# Patient Record
Sex: Female | Born: 1950
Health system: Southern US, Community
[De-identification: ages and names within clinical notes are randomized; demographics above are authoritative.]

## PROBLEM LIST (undated history)

## (undated) DIAGNOSIS — I10 Essential (primary) hypertension: Secondary | ICD-10-CM

## (undated) DIAGNOSIS — F329 Major depressive disorder, single episode, unspecified: Secondary | ICD-10-CM

## (undated) DIAGNOSIS — E669 Obesity, unspecified: Secondary | ICD-10-CM

## (undated) DIAGNOSIS — T7840XA Allergy, unspecified, initial encounter: Secondary | ICD-10-CM

## (undated) DIAGNOSIS — M1990 Unspecified osteoarthritis, unspecified site: Secondary | ICD-10-CM

## (undated) DIAGNOSIS — M810 Age-related osteoporosis without current pathological fracture: Secondary | ICD-10-CM

## (undated) DIAGNOSIS — F419 Anxiety disorder, unspecified: Secondary | ICD-10-CM

## (undated) DIAGNOSIS — E785 Hyperlipidemia, unspecified: Secondary | ICD-10-CM

## (undated) DIAGNOSIS — Z8709 Personal history of other diseases of the respiratory system: Secondary | ICD-10-CM

## (undated) DIAGNOSIS — F32A Depression, unspecified: Secondary | ICD-10-CM

## (undated) DIAGNOSIS — N393 Stress incontinence (female) (male): Secondary | ICD-10-CM

## (undated) HISTORY — PX: TUBAL LIGATION: SHX77

## (undated) HISTORY — PX: CHOLECYSTECTOMY: SHX55

## (undated) HISTORY — PX: KNEE SURGERY: SHX244

## (undated) HISTORY — DX: Unspecified osteoarthritis, unspecified site: M19.90

## (undated) HISTORY — DX: Age-related osteoporosis without current pathological fracture: M81.0

## (undated) HISTORY — DX: Hyperlipidemia, unspecified: E78.5

## (undated) HISTORY — DX: Allergy, unspecified, initial encounter: T78.40XA

## (undated) HISTORY — PX: TONSILLECTOMY: SUR1361

## (undated) HISTORY — PX: OTHER SURGICAL HISTORY: SHX169

## (undated) HISTORY — DX: Anxiety disorder, unspecified: F41.9

## (undated) HISTORY — DX: Depression, unspecified: F32.A

## (undated) HISTORY — DX: Major depressive disorder, single episode, unspecified: F32.9

## (undated) HISTORY — PX: APPENDECTOMY: SHX54

## (undated) HISTORY — PX: LAPAROTOMY: SHX154

## (undated) HISTORY — DX: Essential (primary) hypertension: I10

## (undated) SURGERY — ARTHROPLASTY, KNEE, TOTAL
Anesthesia: Choice | Laterality: Right

---

## 2006-06-16 ENCOUNTER — Encounter (INDEPENDENT_AMBULATORY_CARE_PROVIDER_SITE_OTHER): Payer: Self-pay | Admitting: *Deleted

## 2006-06-16 ENCOUNTER — Ambulatory Visit (HOSPITAL_COMMUNITY): Admission: RE | Admit: 2006-06-16 | Discharge: 2006-06-16 | Payer: Self-pay | Admitting: Obstetrics and Gynecology

## 2007-12-08 ENCOUNTER — Ambulatory Visit: Payer: Self-pay | Admitting: Gastroenterology

## 2007-12-22 ENCOUNTER — Ambulatory Visit: Payer: Self-pay | Admitting: Gastroenterology

## 2007-12-22 HISTORY — PX: COLONOSCOPY: SHX174

## 2011-04-10 NOTE — Op Note (Signed)
Nicole Peters, Nicole Peters               ACCOUNT NO.:  [id number]   MEDICAL RECORD NO.:  [id number]          PATIENT TYPE:  AMB   LOCATION:  SDC                           FACILITY:  [facility]   PHYSICIAN:  Naima A. Dillard, M.D. DATE OF BIRTH:  10-30-1951   DATE OF PROCEDURE:  06/16/2006  DATE OF DISCHARGE:                                 OPERATIVE REPORT   PREOPERATIVE DIAGNOSIS:  Endocervical polyp with irregular vaginal bleeding.   POSTOPERATIVE DIAGNOSIS:  Endocervical polyp with irregular vaginal  bleeding, with endometrial polyp.   PROCEDURE:  D&C hysteroscopy polypectomy.   SURGEON:  Dr. Normand Sloop. There were no assistants.   ANESTHESIA:  General laryngeal mask airway.   SPECIMENS:  Endometrial curettings and polyps.   ESTIMATED BLOOD LOSS:  Minimal.   COMPLICATIONS:  None. The patient went to PACU in stable condition.   PROCEDURE IN DETAIL:  The patient was taken to the operating room. She was  given general anesthesia, placed in the dorsal lithotomy position and  prepped and draped in a normal sterile fashion. Her bladder was drained with  a straight catheter, and a bivalve speculum was placed into the vagina. The  anterior lip of the cervix was grasped with a single-tooth tenaculum. The  uterus sounded to 7 cm. The cervix was further dilated with Pratt dilators  to 21. The hysteroscope was placed into the uterine cavity, and there was a  small endocervical polyp and a small endometrial polyp seen. Polyp forceps  were placed through the cervical cavity, endometrial cavity, and polyps were  removed. A sharp curettage was noted, and 12-degree suction was noted, and  endometrial curettings were obtained. All were sent to pathology for  diagnosis. All instruments were removed from the vagina and cervix. The  tenaculum site was noted to be hemostatic with removal of tenaculum. Sponge,  lap and needle counts were correct. The patient went to the recovery room in  stable  condition.      Naima A. Normand Sloop, M.D.  Electronically Signed     NAD/MEDQ  D:  06/16/2006  T:  06/16/2006  Job:  [id number]

## 2011-04-10 NOTE — H&P (Signed)
NAMEKIRSTY, MONJARAZ                ACCOUNT NO.:  [id number]   MEDICAL RECORD NO.:  [id number]          PATIENT TYPE:  AMB   LOCATION:                                FACILITY:  [facility]   PHYSICIAN:  Naima A. Dillard, M.D. DATE OF BIRTH:  1951-01-13   DATE OF ADMISSION:  06/16/2006  DATE OF DISCHARGE:                                HISTORY & PHYSICAL   DIAGNOSIS:  Endocervical polyp and irregular vaginal bleeding.   HISTORY AND PHYSICAL:  The patient presents in May of 2007 stating that she  had not had menses for 11 months, and then in May she had 1 menses for 5  days.  Six months before that, she had another menses that lasted for 5  days, and 6 months before that she had a menses that lasted 9 days.  She  changed the pad every 2-3 hours.  No chest pain, no shortness of breath and  no history of bleeding disorders.  The patient was taking Estroven, which  she started 2 years ago.  Denied any new medications.  No menopausal  symptoms, vaginal discharge, abdominal pain or increased stress.  The  patient came to the office and was found to have an endocervical polyp that  was benign.  Her endometrial biopsy was also benign.  On ultrasound, the  patient was noted to have a normal-sized uterus, 7 x 5 x 3, with normal  ovaries; however, she had an endocervical hyperechoic mass measuring 0.86 x  0.5 cm in size.  We tried to do a sonohystogram, but the endocervical polyp  blocks it.   MEDICATIONS:  1. Xanax.  2. Wellbutrin.  3. Rhinocort.  4. Fish oil.  5. Aspirin.   PAST SURGICAL HISTORY:  1. Bilateral tubal ligation.  2. Tonsillectomy.  3. Exploratory laparotomy for a small bowel obstruction in 1997.  4. Appendectomy.  5. Cholecystectomy.   PAST MEDICAL HISTORY:  As above.   SOCIAL HISTORY:  She smokes a pack a day.  Denies any alcohol or illicit  drug use.   REVIEW OF SYSTEMS:  CARDIOVASCULAR, GI, ENDOCRINE, MUSCULOSKELETAL:  Unremarkable.  PSYCHIATRIC:  Significant for  anxiety.  GENITOURINARY:  As  above.   PHYSICAL EXAMINATION:  VITAL SIGNS:  The patient's blood pressure is 136/80,  weight is 192.  HEENT:  Pupils are equal.  Hearing is normal.  Throat is clear.  Thyroid is  not enlarged.  HEART:  Regular rate and rhythm.  LUNGS:  Clear to auscultation bilaterally.  BACK:  No CVA tenderness bilaterally.  ABDOMEN:  Nontender without any mass or organomegaly.  EXTREMITIES:  No clubbing, cyanosis, or edema.  NEUROLOGIC:  Within normal limits.  PELVIC:  Full vaginal exam is within normal limits.  Cervix is nontender  with an endocervical polyp and a cervical mass.  The cervical mass was  biopsied and noted to be a nabothian cyst.  Uterus was normal shape, size  and consistency.  Adnexa revealed no masses and nontender.   ASSESSMENT:  1. Irregular vaginal bleeding.  2. Endocervical polyps.   The patient desires D&C hysteroscopy  with polypectomy.  She does not desire  any further treatment, especially if everything is benign.      Naima A. Normand Sloop, M.D.  Electronically Signed     NAD/MEDQ  D:  06/15/2006  T:  06/16/2006  Job:  [id number]

## 2011-05-19 ENCOUNTER — Encounter (HOSPITAL_BASED_OUTPATIENT_CLINIC_OR_DEPARTMENT_OTHER)
Admission: RE | Admit: 2011-05-19 | Discharge: 2011-05-19 | Disposition: A | Discharge status: Home / Self Care | Payer: BC Managed Care – PPO | Source: Ambulatory Visit | Attending: Orthopedic Surgery | Admitting: Orthopedic Surgery

## 2011-05-19 LAB — BASIC METABOLIC PANEL
BUN: 11 mg/dL (ref 6–23)
Creatinine, Ser: 0.65 mg/dL (ref 0.50–1.10)
GFR calc Af Amer: >60 mL/min (ref >60)
GFR calc non Af Amer: >60 mL/min (ref >60)
Glucose, Bld: 104 mg/dL — ABNORMAL HIGH (ref 70–99)
Potassium: 4.4 mEq/L (ref 3.5–5.1)

## 2011-05-20 ENCOUNTER — Ambulatory Visit (HOSPITAL_BASED_OUTPATIENT_CLINIC_OR_DEPARTMENT_OTHER)
Admission: RE | Admit: 2011-05-20 | Discharge: 2011-05-20 | Disposition: A | Discharge status: Home / Self Care | Payer: BC Managed Care – PPO | Source: Ambulatory Visit | Attending: Orthopedic Surgery | Admitting: Orthopedic Surgery

## 2011-05-20 DIAGNOSIS — M2340 Loose body in knee, unspecified knee: Secondary | ICD-10-CM | POA: Insufficient documentation

## 2011-05-20 DIAGNOSIS — Z01810 Encounter for preprocedural cardiovascular examination: Secondary | ICD-10-CM | POA: Insufficient documentation

## 2011-05-20 DIAGNOSIS — M2240 Chondromalacia patellae, unspecified knee: Secondary | ICD-10-CM | POA: Insufficient documentation

## 2011-05-20 DIAGNOSIS — M23359 Other meniscus derangements, posterior horn of lateral meniscus, unspecified knee: Secondary | ICD-10-CM | POA: Insufficient documentation

## 2011-05-20 DIAGNOSIS — I10 Essential (primary) hypertension: Secondary | ICD-10-CM | POA: Insufficient documentation

## 2011-05-20 DIAGNOSIS — Z01812 Encounter for preprocedural laboratory examination: Secondary | ICD-10-CM | POA: Insufficient documentation

## 2012-06-10 ENCOUNTER — Ambulatory Visit (INDEPENDENT_AMBULATORY_CARE_PROVIDER_SITE_OTHER): Payer: BC Managed Care – PPO | Admitting: Internal Medicine

## 2012-06-10 VITALS — BP 140/80 | HR 83 | Temp 98.0°F | Resp 16 | Ht 63.0 in | Wt 190.0 lb

## 2012-06-10 DIAGNOSIS — E789 Disorder of lipoprotein metabolism, unspecified: Secondary | ICD-10-CM

## 2012-06-10 DIAGNOSIS — F17200 Nicotine dependence, unspecified, uncomplicated: Secondary | ICD-10-CM

## 2012-06-10 DIAGNOSIS — Z720 Tobacco use: Secondary | ICD-10-CM

## 2012-06-10 DIAGNOSIS — I10 Essential (primary) hypertension: Secondary | ICD-10-CM

## 2012-06-10 DIAGNOSIS — J209 Acute bronchitis, unspecified: Secondary | ICD-10-CM

## 2012-06-10 DIAGNOSIS — J40 Bronchitis, not specified as acute or chronic: Secondary | ICD-10-CM

## 2012-06-10 DIAGNOSIS — R05 Cough: Secondary | ICD-10-CM

## 2012-06-10 MED ORDER — AZITHROMYCIN 500 MG PO TABS: 500.0000 mg | ORAL_TABLET | Freq: Every day | ORAL | Status: AC

## 2012-06-10 MED ORDER — HYDROCODONE-ACETAMINOPHEN 7.5-500 MG/15ML PO SOLN: 5.0000 mL | Freq: Four times a day (QID) | ORAL | Status: AC | PRN

## 2012-06-10 NOTE — Progress Notes (Signed)
  Subjective:    Patient ID: Nicole Peters, female    DOB: [birth year], 61 y.o.   MRN: [id number]  HPI C/o cough, yellow sputum, chest congestion. Has resumed smoking. No sob,cp   Review of Systems     Objective:   Physical Exam  Constitutional: She is oriented to person, place, and time. She appears well-nourished. No distress.  HENT:  Right Ear: External ear normal.  Left Ear: External ear normal.  Mouth/Throat: Oropharynx is clear and moist.  Cardiovascular: Normal rate, regular rhythm and normal heart sounds.   Pulmonary/Chest: No respiratory distress. She has rhonchi.  Neurological: She is alert and oriented to person, place, and time.  Psychiatric: She has a normal mood and affect.          Assessment & Plan:  Zithromax Lortab elixir Quit smoking

## 2012-06-10 NOTE — Patient Instructions (Addendum)

## 2012-08-09 ENCOUNTER — Telehealth: Payer: Self-pay

## 2012-08-09 NOTE — Telephone Encounter (Signed)
PT JUST WANTED DR MCPHERSON TO KNOW THAT SHE HAVE AN APPT WITH HER IN November AND SHE WENT YESTERDAY TO HAVE HER MAMMOGRAM AND IT WAS NEGATIVE SO SHE WILL BE GETTING THE REPORT.  SHE JUST WANTED HER TO KNOW. YOU MAY REACH PT AT [phone]

## 2012-08-25 ENCOUNTER — Encounter: Payer: Self-pay | Admitting: Family Medicine

## 2012-09-26 ENCOUNTER — Other Ambulatory Visit: Payer: Self-pay | Admitting: Family Medicine

## 2012-09-27 ENCOUNTER — Telehealth: Payer: Self-pay | Admitting: Radiology

## 2012-09-27 MED ORDER — BUPROPION HCL ER (XL) 300 MG PO TB24: 300.0000 mg | ORAL_TABLET | Freq: Every day | ORAL | Status: DC

## 2012-09-27 NOTE — Telephone Encounter (Signed)
Please pull chart.

## 2012-09-27 NOTE — Telephone Encounter (Signed)
Chart pulled to PA pool at nurses station [phone]

## 2012-09-27 NOTE — Telephone Encounter (Signed)
Rx sent to pharmacy   

## 2012-09-27 NOTE — Telephone Encounter (Signed)
[facility] is cal[facility]ill on this patient's refill of Wellbutrin 300 1 po qd. They said that have sent [place] several requests and they haven't heard anything.

## 2012-10-06 ENCOUNTER — Other Ambulatory Visit: Payer: Self-pay | Admitting: Radiology

## 2012-10-06 MED ORDER — ALPRAZOLAM 0.25 MG PO TABS: 0.2500 mg | ORAL_TABLET | Freq: Every evening | ORAL | Status: DC | PRN

## 2012-10-06 NOTE — Telephone Encounter (Signed)
Patients pharmacy [facility] requests Rx for Alprazolam, please advise. Rx pended.

## 2012-10-10 ENCOUNTER — Encounter: Payer: Self-pay | Admitting: Family Medicine

## 2012-10-14 ENCOUNTER — Encounter: Payer: Self-pay | Admitting: Family Medicine

## 2012-10-14 ENCOUNTER — Ambulatory Visit (INDEPENDENT_AMBULATORY_CARE_PROVIDER_SITE_OTHER): Payer: BC Managed Care – PPO | Admitting: Family Medicine

## 2012-10-14 VITALS — BP 120/92 | HR 78 | Temp 97.1°F | Resp 16 | Ht 63.0 in | Wt 186.6 lb

## 2012-10-14 DIAGNOSIS — E669 Obesity, unspecified: Secondary | ICD-10-CM

## 2012-10-14 DIAGNOSIS — I10 Essential (primary) hypertension: Secondary | ICD-10-CM

## 2012-10-14 DIAGNOSIS — E7800 Pure hypercholesterolemia, unspecified: Secondary | ICD-10-CM

## 2012-10-14 DIAGNOSIS — Z0000 Encounter for general adult medical examination without abnormal findings: Secondary | ICD-10-CM

## 2012-10-14 LAB — POCT URINALYSIS DIPSTICK
Bilirubin, UA: NEGATIVE
Glucose, UA: NEGATIVE
Leukocytes, UA: NEGATIVE
Nitrite, UA: NEGATIVE
pH, UA: 7

## 2012-10-14 LAB — COMPREHENSIVE METABOLIC PANEL
ALT: 20 U/L (ref 0–35)
AST: 20 U/L (ref 0–37)
Albumin: 4.7 g/dL (ref 3.5–5.2)
CO2: 25 mEq/L (ref 19–32)
Calcium: 10 mg/dL (ref 8.4–10.5)
Chloride: 102 mEq/L (ref 96–112)
Potassium: 4.5 mEq/L (ref 3.5–5.3)
Sodium: 138 mEq/L (ref 135–145)
Total Protein: 7 g/dL (ref 6.0–8.3)

## 2012-10-14 LAB — LIPID PANEL: Total CHOL/HDL Ratio: 2.4 Ratio

## 2012-10-14 LAB — IFOBT (OCCULT BLOOD): IFOBT: NEGATIVE

## 2012-10-14 MED ORDER — ALPRAZOLAM 0.25 MG PO TABS: ORAL_TABLET | ORAL | Status: DC

## 2012-10-14 MED ORDER — BUPROPION HCL ER (XL) 300 MG PO TB24: 300.0000 mg | ORAL_TABLET | Freq: Every day | ORAL | Status: DC

## 2012-10-14 MED ORDER — BENAZEPRIL HCL 5 MG PO TABS: 5.0000 mg | ORAL_TABLET | Freq: Every day | ORAL | Status: DC

## 2012-10-14 MED ORDER — PRAVASTATIN SODIUM 40 MG PO TABS: 40.0000 mg | ORAL_TABLET | Freq: Every day | ORAL | Status: DC

## 2012-10-14 NOTE — Patient Instructions (Signed)

## 2012-10-14 NOTE — Progress Notes (Signed)
Subjective:    Patient ID: Nicole Peters, female    DOB: 02/25/1951, 60 y.o.   MRN: [id number]  HPI   This 61 y.o. Cauc female is here for CPE. Chronic medical problems include HTN, Anxiety  with Depression, Dyslipidemia, DJD and COPD. Pt had quit smoking Sept 2012 but was seen  at 89 recently for bronchitis and note stated that she had resumed smoking (1/4 ppd).  Pt is married, exercises 4x/week and does not consume alcohol.   Last PAP: 11/12/ 2012 (normal).  Last MMG: 08/08/12 (normal)  Last ECG: 06/18/11 (normal)  Last DEXA: 10/22/10 (normal)  Last CRS: Jan 2009 (normal)    Review of Systems  Constitutional: Negative.   HENT: Negative.   Eyes: Negative.   Respiratory: Negative.   Cardiovascular: Negative.   Gastrointestinal: Negative.   Genitourinary: Negative.   Musculoskeletal: Positive for arthralgias.  Skin: Negative.   Neurological: Negative.   Hematological: Negative.   Psychiatric/Behavioral: The patient is nervous/anxious.        Objective:   Physical Exam  Nursing note and vitals reviewed. Constitutional: She is oriented to person, place, and time. She appears well-developed and well-nourished. No distress.  HENT:  Head: Normocephalic and atraumatic.  Right Ear: Hearing, tympanic membrane, external ear and ear canal normal.  Left Ear: Hearing, tympanic membrane, external ear and ear canal normal.  Nose: Nose normal. No nasal deformity or septal deviation.  Mouth/Throat: Uvula is midline, oropharynx is clear and moist and mucous membranes are normal. No oral lesions. No dental caries.  Eyes: Conjunctivae normal, EOM and lids are normal. Pupils are equal, round, and reactive to light. No scleral icterus.  Fundoscopic exam:      The right eye shows no arteriolar narrowing and no hemorrhage. The right eye shows red reflex.      The left eye shows no arteriolar narrowing and no hemorrhage. The left eye shows red reflex. Neck: Normal range of motion. Neck supple.  No JVD present. No thyromegaly present.  Cardiovascular: Normal rate, regular rhythm, normal heart sounds and intact distal pulses.  Exam reveals no gallop and no friction rub.   No murmur heard. Pulmonary/Chest: Effort normal and breath sounds normal. No respiratory distress. She has no wheezes. Right breast exhibits no inverted nipple, no mass, no nipple discharge, no skin change and no tenderness. Left breast exhibits no inverted nipple, no mass, no nipple discharge, no skin change and no tenderness. Breasts are symmetrical.  Abdominal: Soft. Bowel sounds are normal. She exhibits no distension, no abdominal bruit, no pulsatile midline mass and no mass. There is no hepatosplenomegaly. There is no tenderness. There is no guarding and no CVA tenderness.  Genitourinary: Rectum normal. Rectal exam shows no external hemorrhoid, no fissure, no mass, no tenderness and anal tone normal. Guaiac negative stool.       Pelvic not performed.  Musculoskeletal: Normal range of motion. She exhibits no edema and no tenderness.       Well healed post surgical scars; good ROM w/o effusion.  Lymphadenopathy:    She has no cervical adenopathy.  Neurological: She is alert and oriented to person, place, and time. She has normal reflexes. No cranial nerve deficit. She exhibits normal muscle tone. Coordination normal.  Skin: Skin is warm and dry. No rash noted. No erythema. No pallor.  Psychiatric: She has a normal mood and affect. Her behavior is normal. Judgment and thought content normal.          Assessment & Plan:  1. Routine general medical examination at a health care facility  IFOBT POC (occult bld, rslt in office), POCT urinalysis dipstick  2. Hypercholesteremia  Comprehensive metabolic panel, Lipid panel  3. HTN (hypertension)  Comprehensive metabolic panel  4. Obesity (BMI 30.0-34.9)  Encouraged better nutrition and continued active lifestyle; aim for weight loss of 1/2- 1 pound per week.   RFs on  all current chronic medications completed.

## 2012-10-16 NOTE — Progress Notes (Signed)
Quick Note:  Please notify pt that results are normal.   Provide pt with copy of labs. ______ 

## 2012-10-17 ENCOUNTER — Encounter: Payer: Self-pay | Admitting: *Deleted

## 2012-10-18 ENCOUNTER — Encounter: Payer: Self-pay | Admitting: Family Medicine

## 2012-10-18 DIAGNOSIS — M1990 Unspecified osteoarthritis, unspecified site: Secondary | ICD-10-CM | POA: Insufficient documentation

## 2012-10-18 DIAGNOSIS — Z720 Tobacco use: Secondary | ICD-10-CM | POA: Insufficient documentation

## 2012-10-18 DIAGNOSIS — F418 Other specified anxiety disorders: Secondary | ICD-10-CM | POA: Insufficient documentation

## 2013-04-27 ENCOUNTER — Encounter: Payer: Self-pay | Admitting: Family Medicine

## 2013-04-27 ENCOUNTER — Ambulatory Visit (INDEPENDENT_AMBULATORY_CARE_PROVIDER_SITE_OTHER): Payer: BC Managed Care – PPO | Admitting: Family Medicine

## 2013-04-27 VITALS — BP 160/80 | HR 81 | Temp 99.3°F | Resp 16 | Ht 63.0 in | Wt 189.4 lb

## 2013-04-27 DIAGNOSIS — I10 Essential (primary) hypertension: Secondary | ICD-10-CM

## 2013-04-27 DIAGNOSIS — E669 Obesity, unspecified: Secondary | ICD-10-CM

## 2013-04-27 MED ORDER — PRAVASTATIN SODIUM 40 MG PO TABS: ORAL_TABLET | ORAL | Status: DC

## 2013-04-27 MED ORDER — ALPRAZOLAM 0.25 MG PO TABS: ORAL_TABLET | ORAL | Status: DC

## 2013-04-27 NOTE — Progress Notes (Signed)
S:  This 62 y.o. Cauc female has HTN and is compliant w/ medications w/o side effects. BP readings at home: 115-125/55-80, heart rate in mid- 70s. Pt desires weight loss but is limited by chronic DJD involving knees; she sees Dr. Luiz Blare and is having minimal pain today after receiving joint injections from [place]. She has not considered aqua aerobics. Pt denies diaphoresis, fatigue, CP or tightness, palpitations, edema, cough, SOB or DOE, HA, numbness, dizziness, weakness or syncope.  PMHx, Soc Hx and Fam Hx reviewed.  ROS: As per HPI.  O:  Filed Vitals:   04/27/13 0926  BP: 160/80  Pulse: 81  Temp: 99.3 F (37.4 C)  Resp: 16   GEN: In NAD; WN,WD. Pt is obese. HENT: [place]/AT; EOMI w/ clear conj/ sclerae. Wears glasses.  EACs/ nose/ oroph unremarkable. COR: RRR. No m/g/r. No edema. LUNGS: CTA. MS: Knees- mild deg changes noted; no effusion. (copies of xrays of bilateral knees show moderately severe deg disease w/ bone-on-bone loss of joint space). NEURO: A&O x 3; CNs intact. Nonfocal.  A/P: HTN, goal below 140/80- controlled per pt's home readings; no change in medications  Obesity, unspecified- encouraged some form of low impact exercise to promote weight loss.  Meds ordered this encounter  Medications  . ALPRAZolam (XANAX) 0.25 MG tablet    Sig: Take 1- 1 1/2 tablets by mouth daily.    Dispense:  135 tablet    Refill:  0    Do not fill before Sept 1, 2014.  . pravastatin (PRAVACHOL) 40 MG tablet    Sig: Take 2 tablets every evening at bedtime.    Dispense:  180 tablet    Refill:  1

## 2013-04-27 NOTE — Patient Instructions (Signed)
Your next visit will be in November for annual physical (maybe); labs will be done. Continue all current medications. Try Flax Seed Oil capsules 1000 mg per capsule once a day taken separately from other solid capsules and pills.

## 2013-08-17 ENCOUNTER — Encounter: Payer: Self-pay | Admitting: *Deleted

## 2013-10-17 ENCOUNTER — Encounter: Payer: Self-pay | Admitting: Family Medicine

## 2013-10-17 ENCOUNTER — Ambulatory Visit (INDEPENDENT_AMBULATORY_CARE_PROVIDER_SITE_OTHER): Payer: BC Managed Care – PPO | Admitting: Family Medicine

## 2013-10-17 VITALS — BP 153/85 | HR 83 | Temp 97.7°F | Resp 16 | Ht 62.5 in | Wt 189.0 lb

## 2013-10-17 DIAGNOSIS — E789 Disorder of lipoprotein metabolism, unspecified: Secondary | ICD-10-CM

## 2013-10-17 DIAGNOSIS — F418 Other specified anxiety disorders: Secondary | ICD-10-CM

## 2013-10-17 DIAGNOSIS — F341 Dysthymic disorder: Secondary | ICD-10-CM

## 2013-10-17 DIAGNOSIS — I10 Essential (primary) hypertension: Secondary | ICD-10-CM

## 2013-10-17 LAB — CBC WITH DIFFERENTIAL/PLATELET
Basophils Absolute: 0 10*3/uL (ref 0.0–0.1)
Basophils Relative: 0 % (ref 0–1)
Eosinophils Absolute: 0.2 10*3/uL (ref 0.0–0.7)
Eosinophils Relative: 2 % (ref 0–5)
Hemoglobin: 14.7 g/dL (ref 12.0–15.0)
Lymphs Abs: 2.6 10*3/uL (ref 0.7–4.0)
MCH: 32.2 pg (ref 26.0–34.0)
MCHC: 34.8 g/dL (ref 30.0–36.0)
MCV: 92.5 fL (ref 78.0–100.0)
Monocytes Relative: 9 % (ref 3–12)
Neutro Abs: 5.6 10*3/uL (ref 1.7–7.7)
Neutrophils Relative %: 61 % (ref 43–77)
Platelets: 273 10*3/uL (ref 150–400)
RDW: 13.3 % (ref 11.5–15.5)
WBC: 9.2 10*3/uL (ref 4.0–10.5)

## 2013-10-17 MED ORDER — ALPRAZOLAM 0.25 MG PO TABS: ORAL_TABLET | ORAL | Status: DC

## 2013-10-18 ENCOUNTER — Encounter: Payer: Self-pay | Admitting: Family Medicine

## 2013-10-18 LAB — COMPREHENSIVE METABOLIC PANEL
Albumin: 4.2 g/dL (ref 3.5–5.2)
CO2: 24 mEq/L (ref 19–32)
Glucose, Bld: 95 mg/dL (ref 70–99)
Sodium: 137 mEq/L (ref 135–145)
Total Bilirubin: 0.6 mg/dL (ref 0.3–1.2)
Total Protein: 6.5 g/dL (ref 6.0–8.3)

## 2013-10-18 LAB — LIPID PANEL
Cholesterol: 175 mg/dL (ref 0–200)
HDL: 70 mg/dL (ref >39)

## 2013-10-18 LAB — HEPATITIS C ANTIBODY: HCV Ab: NEGATIVE

## 2013-10-18 NOTE — Progress Notes (Signed)
S:  This pt returns for HTN follow-up and labs monitoring lipid disorder. She is compliant w/ medications w/o adverse effects.  BP readings at home: 115-130/ 65-70. Pt denies fatigue, diaphoresis, vision disturbances, CP or tightness, palpitations, edema, SOB or DOE, cough, HA, numbness, weakness, tremor or syncope.  Pt continues to use Alprazolam as needed to cope w/ family health issues. She has chronic depressin which is controlled and in remission w/ Bupropion XL. Pt has no thoughts of self harm, has normal energy appetite and sleep pattern.   Patient Active Problem List   Diagnosis Date Noted  . DJD (degenerative joint disease) 10/18/2012  . Depression with anxiety 10/18/2012  . Tobacco user 10/18/2012  . Obesity (BMI 30.0-34.9) 10/14/2012  . HTN (hypertension) 06/10/2012  . Lipid disorder 06/10/2012   PMHx, Soc and Fam Hx reviewed.  Medications reconciled.  ROS: As per HPI.  O: Filed Vitals:   10/17/13 1458  BP: 153/85  Pulse: 83  Temp: 97.7 F (36.5 C)  Resp: 16   GEN: In NAD: WN,WD. HENT: [place]/AT; EOMI w/ clear conj/sclerae. Otherwise unremarkable. COR: RRR. LUNGS: Unlabored resp. SKIN: W&D; intact w/o erythema, diaphoresis or pallor. NEURO: A&O x 3; CNs intact. Nonfocal.  A/P: HTN (hypertension) - Stable and controlled on current medication; no change. Plan: Comprehensive metabolic panel, Hepatitis C antibody, CBC with Differential  Lipid disorder - Continue Pravastatin. Continue Flax seed Oil 1000 mg daily. Plan: Lipid panel  Depression with anxiety - Continue Bupropion daily and Alprazolam prn.   Meds ordered this encounter  Medications  . ALPRAZolam (XANAX) 0.25 MG tablet    Sig: Take 1- 1 1/2 tablets by mouth daily.    Dispense:  135 tablet    Refill:  1    135 tablets must last 3 months.

## 2013-10-20 NOTE — Progress Notes (Signed)
Quick Note:  Please notify pt that results are normal.   Provide pt with copy of labs. ______ 

## 2013-12-07 ENCOUNTER — Other Ambulatory Visit: Payer: Self-pay | Admitting: Family Medicine

## 2013-12-29 ENCOUNTER — Other Ambulatory Visit: Payer: Self-pay | Admitting: Family Medicine

## 2014-01-08 ENCOUNTER — Encounter: Payer: BC Managed Care – PPO | Admitting: Family Medicine

## 2014-02-05 ENCOUNTER — Encounter: Payer: Self-pay | Admitting: Family Medicine

## 2014-02-05 ENCOUNTER — Ambulatory Visit (INDEPENDENT_AMBULATORY_CARE_PROVIDER_SITE_OTHER): Payer: BC Managed Care – PPO | Admitting: Family Medicine

## 2014-02-05 VITALS — BP 140/86 | HR 89 | Temp 98.9°F | Resp 16 | Ht 62.75 in | Wt 184.0 lb

## 2014-02-05 DIAGNOSIS — I10 Essential (primary) hypertension: Secondary | ICD-10-CM

## 2014-02-05 DIAGNOSIS — F418 Other specified anxiety disorders: Secondary | ICD-10-CM

## 2014-02-05 DIAGNOSIS — E7800 Pure hypercholesterolemia, unspecified: Secondary | ICD-10-CM

## 2014-02-05 DIAGNOSIS — Z1211 Encounter for screening for malignant neoplasm of colon: Secondary | ICD-10-CM

## 2014-02-05 DIAGNOSIS — E789 Disorder of lipoprotein metabolism, unspecified: Secondary | ICD-10-CM

## 2014-02-05 DIAGNOSIS — F329 Major depressive disorder, single episode, unspecified: Secondary | ICD-10-CM

## 2014-02-05 DIAGNOSIS — F17200 Nicotine dependence, unspecified, uncomplicated: Secondary | ICD-10-CM

## 2014-02-05 DIAGNOSIS — Z0000 Encounter for general adult medical examination without abnormal findings: Secondary | ICD-10-CM

## 2014-02-05 DIAGNOSIS — Z720 Tobacco use: Secondary | ICD-10-CM

## 2014-02-05 DIAGNOSIS — Z01419 Encounter for gynecological examination (general) (routine) without abnormal findings: Secondary | ICD-10-CM

## 2014-02-05 DIAGNOSIS — F3289 Other specified depressive episodes: Secondary | ICD-10-CM

## 2014-02-05 LAB — IFOBT (OCCULT BLOOD): IFOBT: NEGATIVE

## 2014-02-05 LAB — POCT URINALYSIS DIPSTICK
Bilirubin, UA: NEGATIVE
GLUCOSE UA: NEGATIVE
Ketones, UA: NEGATIVE
NITRITE UA: NEGATIVE
Protein, UA: NEGATIVE
Spec Grav, UA: 1.015
UROBILINOGEN UA: 0.2
pH, UA: 7

## 2014-02-05 LAB — LIPID PANEL
Cholesterol: 203 mg/dL — ABNORMAL HIGH (ref 0–200)
HDL: 78 mg/dL (ref >39)
LDL Cholesterol: 103 mg/dL — ABNORMAL HIGH (ref 0–99)
TRIGLYCERIDES: 108 mg/dL (ref <150)
Total CHOL/HDL Ratio: 2.6 Ratio
VLDL: 22 mg/dL (ref 0–40)

## 2014-02-05 LAB — CBC WITH DIFFERENTIAL/PLATELET
BASOS ABS: 0 10*3/uL (ref 0.0–0.1)
BASOS PCT: 0 % (ref 0–1)
EOS PCT: 1 % (ref 0–5)
Eosinophils Absolute: 0.1 10*3/uL (ref 0.0–0.7)
HCT: 44.5 % (ref 36.0–46.0)
Hemoglobin: 15.6 g/dL — ABNORMAL HIGH (ref 12.0–15.0)
LYMPHS PCT: 27 % (ref 12–46)
Lymphs Abs: 2.4 10*3/uL (ref 0.7–4.0)
MCH: 31.6 pg (ref 26.0–34.0)
MCHC: 35.1 g/dL (ref 30.0–36.0)
MCV: 90.1 fL (ref 78.0–100.0)
Monocytes Absolute: 0.6 10*3/uL (ref 0.1–1.0)
Monocytes Relative: 7 % (ref 3–12)
Neutro Abs: 5.7 10*3/uL (ref 1.7–7.7)
Neutrophils Relative %: 65 % (ref 43–77)
Platelets: 254 10*3/uL (ref 150–400)
RBC: 4.94 MIL/uL (ref 3.87–5.11)
RDW: 13.2 % (ref 11.5–15.5)
WBC: 8.8 10*3/uL (ref 4.0–10.5)

## 2014-02-05 LAB — HEMOGLOBIN A1C
HEMOGLOBIN A1C: 5.7 % — AB (ref <5.7)
Mean Plasma Glucose: 117 mg/dL — ABNORMAL HIGH (ref <117)

## 2014-02-05 LAB — COMPLETE METABOLIC PANEL WITH GFR
ALT: 20 U/L (ref 0–35)
AST: 16 U/L (ref 0–37)
Albumin: 4.4 g/dL (ref 3.5–5.2)
Alkaline Phosphatase: 68 U/L (ref 39–117)
BUN: 13 mg/dL (ref 6–23)
CALCIUM: 9.8 mg/dL (ref 8.4–10.5)
CHLORIDE: 100 meq/L (ref 96–112)
CO2: 28 mEq/L (ref 19–32)
Creat: 0.8 mg/dL (ref 0.50–1.10)
GFR, EST NON AFRICAN AMERICAN: 79 mL/min
GFR, Est African American: >89 mL/min
GLUCOSE: 97 mg/dL (ref 70–99)
POTASSIUM: 4.5 meq/L (ref 3.5–5.3)
Sodium: 137 mEq/L (ref 135–145)
Total Bilirubin: 0.6 mg/dL (ref 0.2–1.2)
Total Protein: 6.7 g/dL (ref 6.0–8.3)

## 2014-02-05 LAB — TSH: TSH: 4.243 u[IU]/mL (ref 0.350–4.500)

## 2014-02-05 MED ORDER — ALPRAZOLAM 0.25 MG PO TABS: ORAL_TABLET | ORAL | Status: DC

## 2014-02-05 MED ORDER — PRAVASTATIN SODIUM 40 MG PO TABS: ORAL_TABLET | ORAL | Status: DC

## 2014-02-05 MED ORDER — BENAZEPRIL HCL 5 MG PO TABS: 5.0000 mg | ORAL_TABLET | Freq: Every day | ORAL | Status: DC

## 2014-02-05 MED ORDER — BUPROPION HCL ER (XL) 300 MG PO TB24: 300.0000 mg | ORAL_TABLET | Freq: Every day | ORAL | Status: DC

## 2014-02-05 NOTE — Progress Notes (Signed)
   Subjective:    Patient ID: Nicole Peters, female    DOB: [birth year], 63 y.o.   MRN: [id number]  HPI    Review of Systems  Constitutional: Negative.   HENT: Negative.   Eyes: Negative.   Respiratory: Negative.   Cardiovascular: Negative.   Gastrointestinal: Negative.   Endocrine: Negative.   Genitourinary: Negative.   Musculoskeletal: Positive for arthralgias.  Skin: Negative.   Allergic/Immunologic: Negative.   Neurological: Negative.   Hematological: Negative.   Psychiatric/Behavioral: Negative.        Objective:   Physical Exam        Assessment & Plan:

## 2014-02-05 NOTE — Patient Instructions (Signed)
1.  Add Aspirin 81mg  one every morning. 2.  Recommend stopping smoking. 3. Start multivitamin pack daily.

## 2014-02-05 NOTE — Progress Notes (Signed)
Subjective:   Patient ID: Nicole Peters, female    DOB: 09-11-1951, 63 y.o.   MRN: [id number]  HPI  This chart was scribed for [facility]. Tamala Julian, MD, by Sydell Axon, ED Scribe. This patient was seen in room 23 and the patient's care was started at 10:10 AM.  HPI Comments: Nicole Peters is a 63 y.o. female who presents to the [facility] for a CPE. Her last CPE occurred on 10/14/2012 with Dr. Leward Quan with the following results:  1. Pap smear: 10/05/2011 - normal. Patient denies any recent sexual activity.  2. Mammogram: 08/09/2013 - normal.  3. Bone Density: 10/22/2010 - normal.  4. Colonoscopy 11/2007 normal, has never had polyps. [name].  Repeat in ten years. 5. Vaccinations: Tetanus - 2006, PNA - 2011, Influenza - 2013, Shingles - never 6. Eye examination: 11/2013 - normal exam, no glaucoma, no cataracts. 7. Dental: sees dentist every six months - had gingivitis, had denture placed.  Patient reports that she has had constant, non changing joint pain/arthralgias in her knees, bilaterally, made worse with exertion. Patient reports having multiple meniscus tears following a chiropractic treatment and after exertion. She states that she has seen a specialist who has recommended bilateral knee replacements following arthroscopic surgeries.   She denies any recent illness including URI or influenza. She states her HTN has improved; at home reading from 106/57 to 111/67. Patient reports taking benazepril 5mg  as prescribed with no dizziness or concerning side effects. Patient reports taking all of her prescribed medications with good compliance. She denies taking baby ASA.   Patient states that her father had a history of stomach and liver cancer and angina and that one of her half brothers had an MI. All three brothers are otherwise healthy. Patient reports she lives with her husband. Currently, she works as a Electrical engineer. She reports she smokes infrequently since 62 with  multiple attempts to quit and restarted 1 year ago. She denies drinking alcohol. She states she engages in mild exercise when she can.  Past Medical History  Diagnosis Date  . Hyperlipidemia   . Hypertension   . Arthritis   . Depression   . Anxiety     Past Surgical History  Procedure Laterality Date  . Knee surgery      arthroscopic  . Appendectomy    . Cholecystectomy    . Tubal ligation    . Arthroscopic knee surgery      Both knees- Dr. Berenice Primas    Family History  Problem Relation Age of Onset  . Cancer Father   . Heart disease Brother     mild heart attack    History   Social History  . Marital Status: Married    Spouse Name: N/A    Number of Children: N/A  . Years of Education: N/A   Occupational History  . Not on file.   Social History Main Topics  . Smoking status: Current Every Day Smoker -- 0.25 packs/day    Types: Cigarettes  . Smokeless tobacco: Not on file  . Alcohol Use: No  . Drug Use: No  . Sexual Activity: Not on file   Other Topics Concern  . Not on file   Social History Narrative  . No narrative on file    No Known Allergies  Patient Active Problem List   Diagnosis Date Noted  . DJD (degenerative joint disease) 10/18/2012  . Depression with anxiety 10/18/2012  . Tobacco user 10/18/2012  .  Obesity (BMI 30.0-34.9) 10/14/2012  . HTN (hypertension) 06/10/2012  . Lipid disorder 06/10/2012      Current Outpatient Prescriptions on File Prior to Visit  Medication Sig Dispense Refill  . ALPRAZolam (XANAX) 0.25 MG tablet Take 1- 1 1/2 tablets by mouth daily.  135 tablet  1  . benazepril (LOTENSIN) 5 MG tablet TAKE 1 TABLET (5 MG TOTAL) BY MOUTH DAILY.  90 tablet  0  . buPROPion (WELLBUTRIN XL) 300 MG 24 hr tablet TAKE 1 TABLET (300 MG TOTAL) BY MOUTH DAILY.  90 tablet  0  . cetirizine (ZYRTEC) 10 MG tablet Take 10 mg by mouth daily.      . meloxicam (MOBIC) 15 MG tablet Take 15 mg by mouth daily.      . Multiple Vitamin (MULTIVITAMIN)  tablet Take 1 tablet by mouth daily.      . pravastatin (PRAVACHOL) 40 MG tablet Take 2 tablets every evening at bedtime.  180 tablet  1   No current facility-administered medications on file prior to visit.   Triage Vitals: BP 140/86  Pulse 89  Temp(Src) 98.9 F (37.2 C) (Oral)  Resp 16  Ht 5' 2.75" (1.594 m)  Wt 184 lb (83.462 kg)  BMI 32.85 kg/m2  SpO2 96%  Review of Systems  Constitutional: Negative for fever and chills.  HENT: Negative for congestion, dental problem, ear pain, rhinorrhea and sore throat.   Eyes: Negative.   Respiratory: Negative.  Negative for cough and shortness of breath.   Cardiovascular: Negative.   Gastrointestinal: Negative.  Negative for nausea and vomiting.  Endocrine: Negative.   Genitourinary: Negative.   Musculoskeletal: Positive for arthralgias (bilateral knee). Negative for back pain and neck pain.  Skin: Negative.   Allergic/Immunologic: Negative.   Neurological: Negative.  Negative for weakness.  Hematological: Negative.   Psychiatric/Behavioral: Negative for suicidal ideas, sleep disturbance, self-injury and dysphoric mood. The patient is nervous/anxious.   All other systems reviewed and are negative.   Objective:  Physical Exam  Nursing note and vitals reviewed. Constitutional: She is oriented to person, place, and time. She appears well-developed and well-nourished. No distress.  HENT:  Head: Normocephalic and atraumatic.  Right Ear: External ear normal.  Left Ear: External ear normal.  Nose: Nose normal.  Mouth/Throat: Oropharynx is clear and moist. No oropharyngeal exudate.  Eyes: Conjunctivae and EOM are normal. Pupils are equal, round, and reactive to light.  Neck: Normal range of motion. Neck supple. Carotid bruit is not present. No tracheal deviation present.  Cardiovascular: Normal rate, regular rhythm, normal heart sounds and intact distal pulses.  Exam reveals no gallop and no friction rub.   No murmur  heard. Pulmonary/Chest: Effort normal and breath sounds normal. No respiratory distress. She has no wheezes. She has no rales.  Abdominal: Soft. Bowel sounds are normal. She exhibits no distension and no mass. There is no tenderness. There is no rebound and no guarding.  Genitourinary: Vagina normal and uterus normal. Rectal exam shows external hemorrhoid. No breast swelling, tenderness, discharge or bleeding. Pelvic exam was performed with patient supine. There is no rash, tenderness, lesion or injury on the right labia. There is no rash, tenderness, lesion or injury on the left labia. Cervix exhibits no motion tenderness, no discharge and no friability. Right adnexum displays no mass, no tenderness and no fullness. Left adnexum displays no mass, no tenderness and no fullness.  Musculoskeletal: Normal range of motion.  Lymphadenopathy:    She has no cervical adenopathy.  Neurological: She is  alert and oriented to person, place, and time. She has normal reflexes. No cranial nerve deficit. She exhibits normal muscle tone. Coordination normal.  Skin: Skin is warm and dry. No rash noted. She is not diaphoretic. No erythema. No pallor.  Psychiatric: She has a normal mood and affect. Her behavior is normal. Judgment and thought content normal.   EKG: NSR  Assessment & Plan:  Routine general medical examination at a health care facility - Plan: COMPLETE METABOLIC PANEL WITH GFR, CBC with Differential, TSH, Lipid panel, POCT urinalysis dipstick, EKG 12-Lead, Hemoglobin A1c, Pap IG and HPV (high risk) DNA detection, IFOBT POC (occult bld, rslt in office)  Routine gynecological examination  Screening for colon cancer  HTN (hypertension)  Depression with anxiety  Tobacco user  Lipid disorder  1. CPE: anticipatory guidance --- smoking cessation, start ASA 81mg  daily.  Pap smear obtained; mammogram and colonoscopy UTD,  Pt's insurance does not cover Zostavax; all other immunizations UTD except for  influenza which pt declined today.  Obtain labs. 2.  Gynecological exam:  Pap smear obtained; mammogram UTD.  Post-menopausal; will warrant repeat bone density at age 69. 24.  HTN: controlled; normal home readings; obtain u/a, EKG, labs.  No change in therapy. 4. Hyperlipidemia: controlled; obtain labs; continue current medications. 5.  Depression with anxiety: controlled; refills provided. Husband undergoing radiation for prostate cancer recurrence; coping well. 6.  Colon cancer screening: colonoscopy UTD; hemosure obtained.  Will warrant repeat colonoscopy at ten years. 7.  Tobacco abuse: contemplative; encourage cessation.  Meds ordered this encounter  Medications  . benazepril (LOTENSIN) 5 MG tablet    Sig: Take 1 tablet (5 mg total) by mouth daily.    Dispense:  90 tablet    Refill:  3  . buPROPion (WELLBUTRIN XL) 300 MG 24 hr tablet    Sig: Take 1 tablet (300 mg total) by mouth daily.    Dispense:  90 tablet    Refill:  3  . pravastatin (PRAVACHOL) 40 MG tablet    Sig: Take 2 tablets every evening at bedtime.    Dispense:  180 tablet    Refill:  3  . ALPRAZolam (XANAX) 0.25 MG tablet    Sig: Take 1- 1 1/2 tablets by mouth daily.    Dispense:  135 tablet    Refill:  1    135 tablets must last 3 months.    COORDINATION OF CARE: 10:30 AM-Recommended patient to begin taking Baby ASA. Treatment plan discussed with patient and patient agrees.  I personally performed the services described in this documentation, which was scribed in my presence.  The recorded information has been reviewed and is accurate.   Reginia Forts, M.D.  [facility] [address] [place], [place]  [address] [phone] phone [phone] fax

## 2014-02-08 LAB — PAP IG AND HPV HIGH-RISK
HPV, HIGH-RISK: NEGATIVE
PAP SMEAR COMMENT: 0

## 2014-02-10 ENCOUNTER — Encounter: Payer: Self-pay | Admitting: Radiology

## 2014-02-14 ENCOUNTER — Telehealth: Payer: Self-pay | Admitting: Family Medicine

## 2014-02-14 NOTE — Telephone Encounter (Signed)
Appt made for May since most of April was blocked for scheduling for Dr. Tamala Julian. Pt advised to come in sooner if any issues or problems arise before then. Pt understood.

## 2014-02-14 NOTE — Progress Notes (Signed)
Appt made for May, 2015. Most of April was blocked for Dr. Tamala Julian for scheduling appts. Pt understood.

## 2014-02-14 NOTE — Telephone Encounter (Signed)
Message copied by Chinita Pester on Wed Feb 14, 2014 10:01 AM ------      Message from: Wardell Honour      Created: Fri Feb 09, 2014  9:12 AM       Scheduling --- please schedule OV with me in upcoming month for hematuria. ------

## 2014-04-02 ENCOUNTER — Ambulatory Visit (INDEPENDENT_AMBULATORY_CARE_PROVIDER_SITE_OTHER): Payer: BC Managed Care – PPO | Admitting: Family Medicine

## 2014-04-02 ENCOUNTER — Telehealth: Payer: Self-pay

## 2014-04-02 ENCOUNTER — Encounter: Payer: Self-pay | Admitting: Family Medicine

## 2014-04-02 VITALS — BP 166/87 | HR 91 | Temp 97.8°F | Resp 16 | Ht 62.5 in | Wt 186.0 lb

## 2014-04-02 DIAGNOSIS — R319 Hematuria, unspecified: Secondary | ICD-10-CM

## 2014-04-02 DIAGNOSIS — Z720 Tobacco use: Secondary | ICD-10-CM

## 2014-04-02 LAB — POCT UA - MICROSCOPIC ONLY
Casts, Ur, LPF, POC: NEGATIVE
Crystals, Ur, HPF, POC: NEGATIVE
MUCUS UA: POSITIVE
Yeast, UA: NEGATIVE

## 2014-04-02 LAB — POCT URINALYSIS DIPSTICK
Bilirubin, UA: NEGATIVE
GLUCOSE UA: NEGATIVE
KETONES UA: NEGATIVE
Leukocytes, UA: NEGATIVE
Nitrite, UA: NEGATIVE
Spec Grav, UA: 1.02
UROBILINOGEN UA: 0.2
pH, UA: 6.5

## 2014-04-02 NOTE — Patient Instructions (Signed)
Hematuria, Adult °Hematuria is blood in your urine. It can be caused by a bladder infection, kidney infection, prostate infection, kidney stone, or cancer of your urinary tract. Infections can usually be treated with medicine, and a kidney stone usually will pass through your urine. If neither of these is the cause of your hematuria, further workup to find out the reason may be needed. °It is very important that you tell your health care provider about any blood you see in your urine, even if the blood stops without treatment or happens without causing pain. Blood in your urine that happens and then stops and then happens again can be a symptom of a very serious condition. Also, pain is not a symptom in the initial stages of many urinary cancers. °HOME CARE INSTRUCTIONS  °· Drink lots of fluid, 3 4 quarts a day. If you have been diagnosed with an infection, cranberry juice is especially recommended, in addition to large amounts of water. °· Avoid caffeine, tea, and carbonated beverages, because they tend to irritate the bladder. °· Avoid alcohol because it may irritate the prostate. °· Only take over-the-counter or prescription medicines for pain, discomfort, or fever as directed by your health care provider. °· If you have been diagnosed with a kidney stone, follow your health care provider's instructions regarding straining your urine to catch the stone. °· Empty your bladder often. Avoid holding urine for long periods of time. °· After a bowel movement, women should cleanse front to back. Use each tissue only once. °· Empty your bladder before and after sexual intercourse if you are a female. °SEEK MEDICAL CARE IF: °You develop back pain, fever, a feeling of sickness in your stomach (nausea), or vomiting or if your symptoms are not better in 3 days. Return sooner if you are getting worse. °SEEK IMMEDIATE MEDICAL CARE IF:  °· You have a persistent fever, with a temperature of 101.8°F (38.8°C) or greater. °· You  develop severe vomiting and are unable to keep the medicine down. °· You develop severe back or abdominal pain despite taking your medicines. °· You begin passing a large amount of blood or clots in your urine. °· You feel extremely weak or faint, or you pass out. °MAKE SURE YOU:  °· Understand these instructions. °· Will watch your condition. °· Will get help right away if you are not doing well or get worse. °Document Released: 11/09/2005 Document Revised: 08/30/2013 Document Reviewed: 07/10/2013 °Exit[employer]® Patient Information ©2014 [employer]. ° °

## 2014-04-02 NOTE — Telephone Encounter (Signed)
Dr.Smith, Pt would like to let you know that sometimes she takes ib prohen instead of meloxicam, she would like to know how you feel about this. Best# [phone]

## 2014-04-02 NOTE — Progress Notes (Signed)
Subjective:  This chart was scribed for  Reginia Forts, MD  by Stacy Gardner, [facility] Scribe. The patient was seen in room and the patient's care was started at 9:16 AM.   Patient ID: Nicole Peters, female    DOB: 09-29-1951, 63 y.o.   MRN: [id number]  04/02/2014  Follow-up   HPI HPI Comments: Nicole Peters is a 63 y.o. female who arrives to the [facility] for a follow up of hematuria. Pt had blood in her urine at her last appointment and was asked to return today for a repeat urine culture and u/a. Pt had fibroid removal at [facility] two years ago. Pt was never seen by a Urologist and does not have a preference. Denies dysuria, vaginal pain, abdominal pain, difficulty urinating, frequency, or noticing any hematuria. She has bilateral knee pain and expecting to have knee surgery in the future. Pt uses Tiger Balm for knee pain.  She would like to walk more to lose weight.     Review of Systems  Gastrointestinal: Negative for abdominal pain.  Genitourinary: Negative for dysuria, urgency, frequency, hematuria, flank pain, decreased urine volume, vaginal bleeding, vaginal discharge, difficulty urinating, genital sores, vaginal pain and pelvic pain.  Musculoskeletal:       Bilateral knee pain    Past Medical History  Diagnosis Date  . Hyperlipidemia   . Hypertension   . Arthritis   . Depression   . Anxiety    No Known Allergies Current Outpatient Prescriptions  Medication Sig Dispense Refill  . benazepril (LOTENSIN) 5 MG tablet Take 1 tablet (5 mg total) by mouth daily. 90 tablet 3  . buPROPion (WELLBUTRIN XL) 300 MG 24 hr tablet Take 1 tablet (300 mg total) by mouth daily. 90 tablet 3  . cetirizine (ZYRTEC) 10 MG tablet Take 10 mg by mouth daily.    . meloxicam (MOBIC) 15 MG tablet Take 15 mg by mouth daily.    . Multiple Vitamin (MULTIVITAMIN) tablet Take 1 tablet by mouth daily.    . pravastatin (PRAVACHOL) 40 MG tablet  Take 2 tablets every evening at bedtime. 180 tablet 3  . ALPRAZolam (XANAX) 0.25 MG tablet Take 1- 1 1/2 tablets by mouth daily. 135 tablet 1   No current facility-administered medications for this visit.       Objective:    BP 166/87 mmHg  Pulse 91  Temp(Src) 97.8 F (36.6 C)  Resp 16  Ht 5' 2.5" (1.588 m)  Wt 186 lb (84.369 kg)  BMI 33.46 kg/m2  SpO2 96% Physical Exam  Constitutional: She is oriented to person, place, and time. She appears well-developed and well-nourished. No distress.  HENT:  Head: Normocephalic and atraumatic.  Eyes: Conjunctivae are normal. Pupils are equal, round, and reactive to light.  Neck: Normal range of motion. Neck supple.  Cardiovascular: Normal rate, regular rhythm and normal heart sounds.  Exam reveals no gallop and no friction rub.   No murmur heard. Pulmonary/Chest: Effort normal and breath sounds normal. She has no wheezes. She has no rales.  Abdominal: Soft. Bowel sounds are normal. She exhibits no distension and no mass. There is no tenderness. There is no rebound and no guarding.  Neurological: She is alert and oriented to person, place, and time.  Skin: She is not diaphoretic.  Psychiatric: She has a normal mood and affect. Her behavior is normal.  Nursing note and vitals reviewed.  Results for orders placed or performed  in visit on 04/02/14  Urine culture  Result Value Ref Range   Colony Count 8,000 COLONIES/ML    Organism ID, Bacteria Insignificant Growth   POCT urinalysis dipstick  Result Value Ref Range   Color, UA yellow    Clarity, UA clear    Glucose, UA neg    Bilirubin, UA neg    Ketones, UA neg    Spec Grav, UA 1.020    Blood, UA mod    pH, UA 6.5    Protein, UA trace    Urobilinogen, UA 0.2    Nitrite, UA neg    Leukocytes, UA Negative   POCT UA - Microscopic Only  Result Value Ref Range   WBC, Ur, HPF, POC 2-3    RBC, urine, microscopic 12-18    Bacteria, U Microscopic 1+    Mucus, UA pos    Epithelial  cells, urine per micros 2-5    Crystals, Ur, HPF, POC neg    Casts, Ur, LPF, POC neg    Yeast, UA neg        Assessment & Plan:  Hematuria - Plan: POCT urinalysis dipstick, POCT UA - Microscopic Only, Urine culture, Ambulatory referral to Urology  Tobacco abuse   1. Hematuria: persistent; negative urine culture; refer to urology. 2. Tobacco abuse: high risk for bladder cancer.   No orders of the defined types were placed in this encounter.    No Follow-up on file.  I personally performed the services described in this documentation, which was scribed in my presence.  The recorded information has been reviewed and is accurate.  Reginia Forts, M.D.  [facility] [address] [place], [place]  [address] [phone] phone [phone] fax

## 2014-04-03 LAB — URINE CULTURE

## 2014-04-03 NOTE — Telephone Encounter (Signed)
Return call--- I am fine with pt taking Ibuprofen instead of Meloxicam.  I would recommend her NOT taking Ibuprofen if she has taken Meloxicam that day; the two medications should not be used at the same time but she can take an Ibuprofen one day instead of a Meloxicam.

## 2014-04-04 NOTE — Telephone Encounter (Signed)
Spoke to patient and advised her per Dr. Tamala Julian it is acceptable to take ibuprofen instead of meloxicam.  She should not take them on the same days, but it is fine to alternate ibuprofen one day instead of meloxicam.  She said she would do so.

## 2014-04-05 ENCOUNTER — Telehealth: Payer: Self-pay

## 2014-04-05 NOTE — Telephone Encounter (Signed)
Azuree request to leave a message for Dr. Tamala Julian. Patient states her husband PSA is 0.03 and he is cancer free. [place] states Dr. Alinda Money at [facility] will see her. [phone]

## 2014-04-09 NOTE — Telephone Encounter (Signed)
Noted  

## 2014-08-13 ENCOUNTER — Ambulatory Visit (INDEPENDENT_AMBULATORY_CARE_PROVIDER_SITE_OTHER): Payer: BC Managed Care – PPO | Admitting: Family Medicine

## 2014-08-13 ENCOUNTER — Telehealth: Payer: Self-pay | Admitting: *Deleted

## 2014-08-13 ENCOUNTER — Encounter: Payer: Self-pay | Admitting: Family Medicine

## 2014-08-13 VITALS — BP 148/72 | HR 87 | Temp 97.9°F | Resp 16 | Ht 62.5 in | Wt 166.0 lb

## 2014-08-13 DIAGNOSIS — F17200 Nicotine dependence, unspecified, uncomplicated: Secondary | ICD-10-CM

## 2014-08-13 DIAGNOSIS — I10 Essential (primary) hypertension: Secondary | ICD-10-CM

## 2014-08-13 DIAGNOSIS — R319 Hematuria, unspecified: Secondary | ICD-10-CM | POA: Insufficient documentation

## 2014-08-13 DIAGNOSIS — W19XXXA Unspecified fall, initial encounter: Secondary | ICD-10-CM

## 2014-08-13 DIAGNOSIS — F341 Dysthymic disorder: Secondary | ICD-10-CM

## 2014-08-13 DIAGNOSIS — Z1239 Encounter for other screening for malignant neoplasm of breast: Secondary | ICD-10-CM

## 2014-08-13 DIAGNOSIS — Z720 Tobacco use: Secondary | ICD-10-CM

## 2014-08-13 DIAGNOSIS — S20212A Contusion of left front wall of thorax, initial encounter: Secondary | ICD-10-CM

## 2014-08-13 DIAGNOSIS — E7800 Pure hypercholesterolemia, unspecified: Secondary | ICD-10-CM

## 2014-08-13 DIAGNOSIS — F418 Other specified anxiety disorders: Secondary | ICD-10-CM

## 2014-08-13 DIAGNOSIS — S20219A Contusion of unspecified front wall of thorax, initial encounter: Secondary | ICD-10-CM

## 2014-08-13 LAB — COMPLETE METABOLIC PANEL WITH GFR
ALBUMIN: 4.2 g/dL (ref 3.5–5.2)
ALT: 14 U/L (ref 0–35)
AST: 16 U/L (ref 0–37)
Alkaline Phosphatase: 109 U/L (ref 39–117)
BUN: 12 mg/dL (ref 6–23)
CO2: 24 mEq/L (ref 19–32)
Calcium: 9.5 mg/dL (ref 8.4–10.5)
Chloride: 100 mEq/L (ref 96–112)
Creat: 0.84 mg/dL (ref 0.50–1.10)
GFR, Est African American: 86 mL/min
GFR, Est Non African American: 74 mL/min
Glucose, Bld: 88 mg/dL (ref 70–99)
POTASSIUM: 4.4 meq/L (ref 3.5–5.3)
Sodium: 133 mEq/L — ABNORMAL LOW (ref 135–145)
Total Bilirubin: 0.5 mg/dL (ref 0.2–1.2)
Total Protein: 6.5 g/dL (ref 6.0–8.3)

## 2014-08-13 LAB — CBC WITH DIFFERENTIAL/PLATELET
BASOS PCT: 0 % (ref 0–1)
Basophils Absolute: 0 10*3/uL (ref 0.0–0.1)
EOS ABS: 0.2 10*3/uL (ref 0.0–0.7)
EOS PCT: 2 % (ref 0–5)
HEMATOCRIT: 44 % (ref 36.0–46.0)
HEMOGLOBIN: 14.9 g/dL (ref 12.0–15.0)
LYMPHS ABS: 2.2 10*3/uL (ref 0.7–4.0)
Lymphocytes Relative: 23 % (ref 12–46)
MCH: 31.4 pg (ref 26.0–34.0)
MCHC: 33.9 g/dL (ref 30.0–36.0)
MCV: 92.6 fL (ref 78.0–100.0)
MONO ABS: 0.7 10*3/uL (ref 0.1–1.0)
MONOS PCT: 7 % (ref 3–12)
NEUTROS PCT: 68 % (ref 43–77)
Neutro Abs: 6.4 10*3/uL (ref 1.7–7.7)
Platelets: 226 10*3/uL (ref 150–400)
RBC: 4.75 MIL/uL (ref 3.87–5.11)
RDW: 13.1 % (ref 11.5–15.5)
WBC: 9.4 10*3/uL (ref 4.0–10.5)

## 2014-08-13 LAB — LIPID PANEL
CHOL/HDL RATIO: 2.3 ratio
CHOLESTEROL: 162 mg/dL (ref 0–200)
HDL: 71 mg/dL (ref >39)
LDL CALC: 71 mg/dL (ref 0–99)
Triglycerides: 100 mg/dL (ref <150)
VLDL: 20 mg/dL (ref 0–40)

## 2014-08-13 MED ORDER — ALPRAZOLAM 0.25 MG PO TABS: ORAL_TABLET | ORAL | Status: DC

## 2014-08-13 NOTE — Telephone Encounter (Signed)
Yes, if patient thinks that she can tolerate a mammogram (she has recently injured L ribs), then I would like her to proceed with mammogram this week.

## 2014-08-13 NOTE — Telephone Encounter (Signed)
Pt. Saw Dr. Tamala Julian this morning for an appt. And would like to know if she still wants her to have her mammogram at the end of the week Please call  [phone]

## 2014-08-13 NOTE — Progress Notes (Signed)
Subjective:    Patient ID: Nicole Peters, female    DOB: February 03, 1951, 63 y.o.   MRN: [id number]  08/13/2014  Follow-up, Hyperlipidemia and Hypertension   HPI This 63 y.o. female presents for six month follow-up of HTN, hyperlipidemia, hematuria, anxiety.  1. HTN:  Blood pressure has been fluctuating with recent pains; highest reading 131/81.  This morning 124/67. Patient reports good compliance with medication, good tolerance to medication, and good symptom control.    2. Hyperlipidemia:  Six month follow-up; no changes to management made at last visit; Patient reports good compliance with medication, good tolerance to medication, and good symptom control.    3. Hematuria:s/p cystoscopy WNL: s/p CT with contrast was negative.  [facility]; recommend follow-up in one year.  No further work up needed past this point.  History of hematuria in urine in past.    4. Anxiety:  Six month follow-up; no changes to management made at last visit.  Patient reports good compliance with medication, good tolerance to medication, and good symptom control.  Takes Xanax 1 every morning and 1/2 every evening as needed.  Doing well emotionally. Husband is doing well.    5.  L rib contusion and groin strains:  Fell five weeks ago while moving recycling bin to [employer].  Tripped over bin.  S/p evaluation by Dr. Berenice Primas.  No fractures.  Using crutches for groin strain.  No rib fractures.  Slept in bed for the first time last night.  Taking Ibuprofen, Tramadol, Lidocaine patches.    Plans to get flu vaccine at [facility]; cheaper.  Review of Systems  Constitutional: Negative for fever, chills, diaphoresis and fatigue.  HENT: Negative for congestion, ear pain, postnasal drip, rhinorrhea, sinus pressure and sore throat.   Eyes: Negative for visual disturbance.  Respiratory: Negative for cough and shortness of breath.   Cardiovascular: Negative for chest pain, palpitations and leg swelling.  Gastrointestinal: Negative for nausea,  vomiting, abdominal pain, diarrhea and constipation.  Endocrine: Negative for cold intolerance, heat intolerance, polydipsia, polyphagia and polyuria.  Musculoskeletal: Positive for arthralgias, gait problem and myalgias.  Skin: Negative for rash and wound.  Neurological: Negative for dizziness, tremors, seizures, syncope, facial asymmetry, speech difficulty, weakness, light-headedness, numbness and headaches.  Psychiatric/Behavioral: Negative for sleep disturbance and dysphoric mood. The patient is not nervous/anxious.     Past Medical History  Diagnosis Date  . Hyperlipidemia   . Hypertension   . Arthritis   . Depression   . Anxiety    Past Surgical History  Procedure Laterality Date  . Knee surgery      arthroscopic  . Appendectomy    . Cholecystectomy    . Tubal ligation    . Arthroscopic knee surgery      Both knees- Dr. Berenice Primas  . Colonoscopy  12/22/2007    Diverticulosis. [facility]/[place]. Repeat in 10 years.   No Known Allergies Current Outpatient Prescriptions  Medication Sig Dispense Refill  . ALPRAZolam (XANAX) 0.25 MG tablet Take 1- 1 1/2 tablets by mouth daily.  135 tablet  1  . benazepril (LOTENSIN) 5 MG tablet Take 1 tablet (5 mg total) by mouth daily.  90 tablet  3  . buPROPion (WELLBUTRIN XL) 300 MG 24 hr tablet Take 1 tablet (300 mg total) by mouth daily.  90 tablet  3  . cetirizine (ZYRTEC) 10 MG tablet Take 10 mg by mouth daily.      . meloxicam (MOBIC) 15 MG tablet Take 15 mg by mouth daily.      Marland Kitchen  Multiple Vitamin (MULTIVITAMIN) tablet Take 1 tablet by mouth daily.      . pravastatin (PRAVACHOL) 40 MG tablet Take 2 tablets every evening at bedtime.  180 tablet  3   No current facility-administered medications for this visit.       Objective:    BP 148/72  Pulse 87  Temp(Src) 97.9 F (36.6 C) (Oral)  Resp 16  Ht 5' 2.5" (1.588 m)  Wt 166 lb (75.297 kg)  BMI 29.86 kg/m2  SpO2 95% Physical Exam  Constitutional: She is oriented to person,  place, and time. She appears well-developed and well-nourished. No distress.  HENT:  Head: Normocephalic and atraumatic.  Right Ear: External ear normal.  Left Ear: External ear normal.  Nose: Nose normal.  Mouth/Throat: Oropharynx is clear and moist.  Eyes: Conjunctivae and EOM are normal. Pupils are equal, round, and reactive to light.  Neck: Normal range of motion. Neck supple. Carotid bruit is not present. No thyromegaly present.  Cardiovascular: Normal rate, regular rhythm, normal heart sounds and intact distal pulses.  Exam reveals no gallop and no friction rub.   No murmur heard. Pulmonary/Chest: Effort normal and breath sounds normal. She has no wheezes. She has no rales.  Abdominal: Soft. Bowel sounds are normal. She exhibits no distension and no mass. There is no tenderness. There is no rebound and no guarding.  Lymphadenopathy:    She has no cervical adenopathy.  Neurological: She is alert and oriented to person, place, and time. No cranial nerve deficit.  Skin: Skin is warm and dry. No rash noted. She is not diaphoretic. No erythema. No pallor.  Psychiatric: She has a normal mood and affect. Her behavior is normal.       Assessment & Plan:   1. Essential hypertension, benign   2. Pure hypercholesterolemia   3. Tobacco user   4. Depression with anxiety   5. Hematuria, unspecified   6. Rib contusion, left, initial encounter   7. Fall, initial encounter     1. HTN: controlled; obtain labs; continue current medications. 2.  Hypercholesterolemia: controlled; obtain labs; continue current medications. 3.  Tobacco abuse: continues to smoke 1/2 ppd; pre-contemplative. 4.  Depression with anxiety: stable; with Wellbutrin and Xanax; refill of Xanax provided. 5.  Hematuria microscopic: stable; s/p urology consultation by Risa Grill; s/p cystoscopy and CT with negative work up.   6. Fall with L rib contusion and L groin strain:  New.  S/p ortho consultation; progressing well.      Meds ordered this encounter  Medications  . ALPRAZolam (XANAX) 0.25 MG tablet    Sig: Take 1- 1 1/2 tablets by mouth daily.    Dispense:  135 tablet    Refill:  1    135 tablets must last 3 months.    Return in about 6 months (around 02/11/2015) for complete physical examiniation.    Reginia Forts, M.D.  [facility] [address] [place], [place]  [address] [phone] phone [phone] fax

## 2014-08-14 NOTE — Telephone Encounter (Signed)
Orders for MM created. Pt notified.

## 2015-02-11 ENCOUNTER — Ambulatory Visit (INDEPENDENT_AMBULATORY_CARE_PROVIDER_SITE_OTHER): Payer: BLUE CROSS/BLUE SHIELD | Admitting: Family Medicine

## 2015-02-11 ENCOUNTER — Encounter: Payer: Self-pay | Admitting: Family Medicine

## 2015-02-11 VITALS — BP 160/60 | HR 82 | Temp 98.2°F | Resp 16 | Ht 62.5 in | Wt 174.6 lb

## 2015-02-11 DIAGNOSIS — F32A Depression, unspecified: Secondary | ICD-10-CM

## 2015-02-11 DIAGNOSIS — F329 Major depressive disorder, single episode, unspecified: Secondary | ICD-10-CM

## 2015-02-11 DIAGNOSIS — R319 Hematuria, unspecified: Secondary | ICD-10-CM | POA: Diagnosis not present

## 2015-02-11 DIAGNOSIS — E669 Obesity, unspecified: Secondary | ICD-10-CM

## 2015-02-11 DIAGNOSIS — E785 Hyperlipidemia, unspecified: Secondary | ICD-10-CM | POA: Diagnosis not present

## 2015-02-11 DIAGNOSIS — I10 Essential (primary) hypertension: Secondary | ICD-10-CM

## 2015-02-11 DIAGNOSIS — Z131 Encounter for screening for diabetes mellitus: Secondary | ICD-10-CM | POA: Diagnosis not present

## 2015-02-11 DIAGNOSIS — E559 Vitamin D deficiency, unspecified: Secondary | ICD-10-CM

## 2015-02-11 DIAGNOSIS — F418 Other specified anxiety disorders: Secondary | ICD-10-CM | POA: Diagnosis not present

## 2015-02-11 DIAGNOSIS — Z0000 Encounter for general adult medical examination without abnormal findings: Secondary | ICD-10-CM | POA: Diagnosis not present

## 2015-02-11 DIAGNOSIS — F419 Anxiety disorder, unspecified: Secondary | ICD-10-CM

## 2015-02-11 LAB — CBC WITH DIFFERENTIAL/PLATELET
BASOS ABS: 0 10*3/uL (ref 0.0–0.1)
Basophils Relative: 0 % (ref 0–1)
EOS PCT: 3 % (ref 0–5)
Eosinophils Absolute: 0.3 10*3/uL (ref 0.0–0.7)
HEMATOCRIT: 43.7 % (ref 36.0–46.0)
HEMOGLOBIN: 14.7 g/dL (ref 12.0–15.0)
Lymphocytes Relative: 36 % (ref 12–46)
Lymphs Abs: 4.1 10*3/uL — ABNORMAL HIGH (ref 0.7–4.0)
MCH: 31.3 pg (ref 26.0–34.0)
MCHC: 33.6 g/dL (ref 30.0–36.0)
MCV: 93.2 fL (ref 78.0–100.0)
MONO ABS: 1 10*3/uL (ref 0.1–1.0)
MONOS PCT: 9 % (ref 3–12)
MPV: 9.7 fL (ref 8.6–12.4)
NEUTROS ABS: 6 10*3/uL (ref 1.7–7.7)
NEUTROS PCT: 52 % (ref 43–77)
Platelets: 269 10*3/uL (ref 150–400)
RBC: 4.69 MIL/uL (ref 3.87–5.11)
RDW: 13.4 % (ref 11.5–15.5)
WBC: 11.5 10*3/uL — ABNORMAL HIGH (ref 4.0–10.5)

## 2015-02-11 LAB — COMPREHENSIVE METABOLIC PANEL
ALK PHOS: 70 U/L (ref 39–117)
ALT: 21 U/L (ref 0–35)
AST: 15 U/L (ref 0–37)
Albumin: 4 g/dL (ref 3.5–5.2)
BILIRUBIN TOTAL: 0.5 mg/dL (ref 0.2–1.2)
BUN: 16 mg/dL (ref 6–23)
CO2: 29 mEq/L (ref 19–32)
CREATININE: 0.9 mg/dL (ref 0.50–1.10)
Calcium: 9.4 mg/dL (ref 8.4–10.5)
Chloride: 101 mEq/L (ref 96–112)
Glucose, Bld: 83 mg/dL (ref 70–99)
Potassium: 4.2 mEq/L (ref 3.5–5.3)
Sodium: 139 mEq/L (ref 135–145)
TOTAL PROTEIN: 6.4 g/dL (ref 6.0–8.3)

## 2015-02-11 LAB — VITAMIN D 25 HYDROXY (VIT D DEFICIENCY, FRACTURES): VIT D 25 HYDROXY: 9 ng/mL — AB (ref 30–100)

## 2015-02-11 LAB — LIPID PANEL
CHOLESTEROL: 163 mg/dL (ref 0–200)
HDL: 87 mg/dL (ref >=46)
LDL Cholesterol: 48 mg/dL (ref 0–99)
TRIGLYCERIDES: 141 mg/dL (ref <150)
Total CHOL/HDL Ratio: 1.9 Ratio
VLDL: 28 mg/dL (ref 0–40)

## 2015-02-11 LAB — POCT URINALYSIS DIPSTICK
Bilirubin, UA: NEGATIVE
GLUCOSE UA: NEGATIVE
KETONES UA: NEGATIVE
Nitrite, UA: NEGATIVE
Protein, UA: NEGATIVE
SPEC GRAV UA: 1.01
UROBILINOGEN UA: 0.2
pH, UA: 6

## 2015-02-11 LAB — VITAMIN B12: VITAMIN B 12: 423 pg/mL (ref 211–911)

## 2015-02-11 LAB — HEMOGLOBIN A1C
HEMOGLOBIN A1C: 5.5 % (ref <5.7)
MEAN PLASMA GLUCOSE: 111 mg/dL (ref <117)

## 2015-02-11 LAB — TSH: TSH: 6.024 u[IU]/mL — AB (ref 0.350–4.500)

## 2015-02-11 MED ORDER — ZOSTER VACCINE LIVE 19400 UNT/0.65ML ~~LOC~~ SOLR: 0.6500 mL | Freq: Once | SUBCUTANEOUS | Status: DC

## 2015-02-11 MED ORDER — BUPROPION HCL ER (XL) 300 MG PO TB24: 300.0000 mg | ORAL_TABLET | Freq: Every day | ORAL | Status: DC

## 2015-02-11 MED ORDER — ALPRAZOLAM 0.25 MG PO TABS: ORAL_TABLET | ORAL | Status: DC

## 2015-02-11 MED ORDER — PRAVASTATIN SODIUM 40 MG PO TABS: ORAL_TABLET | ORAL | Status: DC

## 2015-02-11 MED ORDER — BENAZEPRIL HCL 5 MG PO TABS: 5.0000 mg | ORAL_TABLET | Freq: Every day | ORAL | Status: DC

## 2015-02-11 NOTE — Patient Instructions (Signed)

## 2015-02-11 NOTE — Progress Notes (Signed)
Subjective:    Patient ID: Nicole Peters, female    DOB: 1950/12/07, 64 y.o.   MRN: [id number]  02/11/2015  Annual Exam   HPI This 64 y.o. female presents for Complete Physical Examination.  Last physical:  02/05/14 Pap smear:  02/05/14  WNL; HPV negative. Mammogram:  08/09/13; 08/31/14 [facility].  Not on chart. Colonoscopy:  12/22/2007; repeat in 10 years. Bone density:  2011, 2007 TDAP:  2006 Pneumovax:  2011 Zostavax: never; insurance will cover.   Influenza:  2014; has not received flu vaccine this year. Eye exam:  2015; +glasses Dental exam:  Every six months.   Finished 21 dose pack for R knee by Dr. Berenice Primas for knee bursitis R.   Will eventually need knee replacement.  Thinking about June 2016; will need to have B knee replacements.  Husband had R knee replacement in past.    Review of Systems  Constitutional: Negative for fever, chills, diaphoresis, activity change, appetite change, fatigue and unexpected weight change.  HENT: Negative for congestion, dental problem, drooling, ear discharge, ear pain, facial swelling, hearing loss, mouth sores, nosebleeds, postnasal drip, rhinorrhea, sinus pressure, sneezing, sore throat, tinnitus, trouble swallowing and voice change.   Eyes: Negative for photophobia, pain, discharge, redness, itching and visual disturbance.  Respiratory: Negative for apnea, cough, choking, chest tightness, shortness of breath, wheezing and stridor.   Cardiovascular: Negative for chest pain, palpitations and leg swelling.  Gastrointestinal: Negative for nausea, vomiting, abdominal pain, diarrhea, constipation, blood in stool, abdominal distention, anal bleeding and rectal pain.  Endocrine: Negative for cold intolerance, heat intolerance, polydipsia, polyphagia and polyuria.  Genitourinary: Negative for dysuria, urgency, frequency, hematuria, flank pain, decreased urine volume, vaginal bleeding, vaginal discharge, enuresis, difficulty urinating, genital sores,  vaginal pain, menstrual problem, pelvic pain and dyspareunia.  Musculoskeletal: Positive for arthralgias. Negative for myalgias, back pain, joint swelling, gait problem, neck pain and neck stiffness.  Skin: Negative for color change, pallor, rash and wound.  Allergic/Immunologic: Negative for environmental allergies, food allergies and immunocompromised state.  Neurological: Negative for dizziness, tremors, seizures, syncope, facial asymmetry, speech difficulty, weakness, light-headedness, numbness and headaches.  Hematological: Negative for adenopathy. Does not bruise/bleed easily.  Psychiatric/Behavioral: Negative for suicidal ideas, hallucinations, behavioral problems, confusion, sleep disturbance, self-injury, dysphoric mood, decreased concentration and agitation. The patient is not nervous/anxious and is not hyperactive.     Past Medical History  Diagnosis Date  . Hyperlipidemia   . Hypertension   . Arthritis   . Depression   . Anxiety    Past Surgical History  Procedure Laterality Date  . Knee surgery      arthroscopic  . Appendectomy    . Cholecystectomy    . Tubal ligation    . Arthroscopic knee surgery      Both knees- Dr. Berenice Primas  . Colonoscopy  12/22/2007    Diverticulosis. [facility]/[place]. Repeat in 10 years.   No Known Allergies Current Outpatient Prescriptions  Medication Sig Dispense Refill  . ALPRAZolam (XANAX) 0.25 MG tablet Take 1- 1 1/2 tablets by mouth daily. 135 tablet 1  . aspirin 81 MG tablet Take 81 mg by mouth daily.    . cetirizine (ZYRTEC) 10 MG tablet Take 10 mg by mouth daily.    . meloxicam (MOBIC) 15 MG tablet Take 15 mg by mouth daily.    . Multiple Vitamin (MULTIVITAMIN) tablet Take 1 tablet by mouth daily.    . pravastatin (PRAVACHOL) 40 MG tablet Take 2 tablets every evening at bedtime. 180 tablet  3  . benazepril (LOTENSIN) 5 MG tablet Take 1 tablet (5 mg total) by mouth daily. 90 tablet 3  . buPROPion (WELLBUTRIN XL) 300 MG 24 hr tablet  Take 1 tablet (300 mg total) by mouth daily. 90 tablet 3  . zoster vaccine live, PF, (ZOSTAVAX) 76195 UNT/0.65ML injection Inject 19,400 Units into the skin once. 0.65 mL 0   No current facility-administered medications for this visit.       Objective:    BP 160/60 mmHg  Pulse 82  Temp(Src) 98.2 F (36.8 C) (Oral)  Resp 16  Ht 5' 2.5" (1.588 m)  Wt 174 lb 9.6 oz (79.198 kg)  BMI 31.41 kg/m2  SpO2 96% Physical Exam  Constitutional: She is oriented to person, place, and time. She appears well-developed and well-nourished. No distress.  HENT:  Head: Normocephalic and atraumatic.  Right Ear: External ear normal.  Left Ear: External ear normal.  Nose: Nose normal.  Mouth/Throat: Oropharynx is clear and moist.  Eyes: Conjunctivae and EOM are normal. Pupils are equal, round, and reactive to light.  Neck: Normal range of motion and full passive range of motion without pain. Neck supple. No JVD present. Carotid bruit is not present. No thyromegaly present.  Cardiovascular: Normal rate, regular rhythm and normal heart sounds.  Exam reveals no gallop and no friction rub.   No murmur heard. Pulmonary/Chest: Effort normal and breath sounds normal. She has no wheezes. She has no rales. Right breast exhibits no inverted nipple, no mass, no nipple discharge, no skin change and no tenderness. Left breast exhibits inverted nipple. Left breast exhibits no mass, no nipple discharge, no skin change and no tenderness. Breasts are symmetrical.  Abdominal: Soft. Bowel sounds are normal. She exhibits no distension and no mass. There is no tenderness. There is no rebound and no guarding.  Genitourinary: Vagina normal and uterus normal. There is no rash, tenderness, lesion or injury on the right labia. There is no rash, tenderness, lesion or injury on the left labia. Cervix exhibits no motion tenderness, no discharge and no friability. Right adnexum displays no mass, no tenderness and no fullness. Left adnexum  displays no mass, no tenderness and no fullness.  Musculoskeletal:       Right shoulder: Normal.       Left shoulder: Normal.       Cervical back: Normal.  Lymphadenopathy:    She has no cervical adenopathy.  Neurological: She is alert and oriented to person, place, and time. She has normal reflexes. No cranial nerve deficit. She exhibits normal muscle tone. Coordination normal.  Skin: Skin is warm and dry. No rash noted. She is not diaphoretic. No erythema. No pallor.  Psychiatric: She has a normal mood and affect. Her behavior is normal. Judgment and thought content normal.  Nursing note and vitals reviewed.  Results for orders placed or performed in visit on 02/11/15  POCT urinalysis dipstick  Result Value Ref Range   Color, UA yellow    Clarity, UA clear    Glucose, UA neg    Bilirubin, UA neg    Ketones, UA neg    Spec Grav, UA 1.010    Blood, UA trace    pH, UA 6.0    Protein, UA neg    Urobilinogen, UA 0.2    Nitrite, UA neg    Leukocytes, UA Trace        Assessment & Plan:   1. Routine physical examination   2. Essential hypertension, benign   3.  Hyperlipidemia   4. Anxiety and depression   5. Screening for diabetes mellitus     1. Complete Physical Examination: anticipatory guidance --- exercise, weight loss. Congratulations on smoking cessation!  Pap smear UTD 2015.  Mammogram UTD 08/2014; obtain report.  Last bone density 2011; will warrant repeat at age 87.  Colonoscopy UTD.  To receive flu vaccine in two weeks; rx for Zostavax provided; will administer TDAP at next visit. 2.  HTN: controlled at home; known white coat syndrome; obtain u/a, labs.  Refill of medication provided. 3.  Hyperlipidemia: controlled; obtain labs; refill provided. 4.  Anxiety and depression: controlled; refills provided. 5.  Screening diabetes: obtain glucose, HgbA1c. 6. Hematuria: improved; s/p urological evaluation in past year;  Follow up in July 2016 with Grape[facility].   Meds ordered  this encounter  Medications  . aspirin 81 MG tablet    Sig: Take 81 mg by mouth daily.  . benazepril (LOTENSIN) 5 MG tablet    Sig: Take 1 tablet (5 mg total) by mouth daily.    Dispense:  90 tablet    Refill:  3  . buPROPion (WELLBUTRIN XL) 300 MG 24 hr tablet    Sig: Take 1 tablet (300 mg total) by mouth daily.    Dispense:  90 tablet    Refill:  3  . pravastatin (PRAVACHOL) 40 MG tablet    Sig: Take 2 tablets every evening at bedtime.    Dispense:  180 tablet    Refill:  3  . ALPRAZolam (XANAX) 0.25 MG tablet    Sig: Take 1- 1 1/2 tablets by mouth daily.    Dispense:  135 tablet    Refill:  1    135 tablets must last 3 months.  . zoster vaccine live, PF, (ZOSTAVAX) 66294 UNT/0.65ML injection    Sig: Inject 19,400 Units into the skin once.    Dispense:  0.65 mL    Refill:  0    Return in about 6 months (around 08/14/2015) for recheck hypertension,high cholesterol.     Chesney Klimaszewski Elayne Guerin, M.D. [facility] [address] [place], [place]  [address] [phone] phone [phone] fax

## 2015-02-14 MED ORDER — VITAMIN D (ERGOCALCIFEROL) 1.25 MG (50000 UNIT) PO CAPS: 50000.0000 [IU] | ORAL_CAPSULE | ORAL | Status: DC

## 2015-02-14 NOTE — Addendum Note (Signed)
Addended by: Wardell Honour on: 02/14/2015 11:10 AM   Modules accepted: Orders

## 2015-04-15 ENCOUNTER — Encounter: Payer: Self-pay | Admitting: *Deleted

## 2015-05-06 ENCOUNTER — Ambulatory Visit: Payer: BLUE CROSS/BLUE SHIELD | Admitting: Family Medicine

## 2015-05-17 ENCOUNTER — Ambulatory Visit (INDEPENDENT_AMBULATORY_CARE_PROVIDER_SITE_OTHER): Payer: BLUE CROSS/BLUE SHIELD | Admitting: Family Medicine

## 2015-05-17 ENCOUNTER — Encounter: Payer: Self-pay | Admitting: Family Medicine

## 2015-05-17 VITALS — BP 189/94 | HR 77 | Temp 98.3°F | Resp 16 | Ht 62.0 in | Wt 179.6 lb

## 2015-05-17 DIAGNOSIS — R946 Abnormal results of thyroid function studies: Secondary | ICD-10-CM | POA: Diagnosis not present

## 2015-05-17 DIAGNOSIS — E559 Vitamin D deficiency, unspecified: Secondary | ICD-10-CM | POA: Diagnosis not present

## 2015-05-17 NOTE — Patient Instructions (Signed)
Vitamin D Deficiency Vitamin D is an important vitamin that your body needs. Having too little of it in your body is called a deficiency. A very bad deficiency can make your bones soft and can cause a condition called rickets.  Vitamin D is important to your body for different reasons, such as:   It helps your body absorb 2 minerals called calcium and phosphorus.  It helps make your bones healthy.  It may prevent some diseases, such as diabetes and multiple sclerosis.  It helps your muscles and heart. You can get vitamin D in several ways. It is a natural part of some foods. The vitamin is also added to some dairy products and cereals. Some people take vitamin D supplements. Also, your body makes vitamin D when you are in the sun. It changes the sun's rays into a form of the vitamin that your body can use. CAUSES   Not eating enough foods that contain vitamin D.  Not getting enough sunlight.  Having certain digestive system diseases that make it hard to absorb vitamin D. These diseases include Crohn's disease, chronic pancreatitis, and cystic fibrosis.  Having a surgery in which part of the stomach or small intestine is removed.  Being obese. Fat cells pull vitamin D out of your blood. That means that obese people may not have enough vitamin D left in their blood and in other body tissues.  Having chronic kidney or liver disease. RISK FACTORS Risk factors are things that make you more likely to develop a vitamin D deficiency. They include:  Being older.  Not being able to get outside very much.  Living in a nursing home.  Having had broken bones.  Having weak or thin bones (osteoporosis).  Having a disease or condition that changes how your body absorbs vitamin D.  Having dark skin.  Some medicines such as seizure medicines or steroids.  Being overweight or obese. SYMPTOMS Mild cases of vitamin D deficiency may not have any symptoms. If you have a very bad case, symptoms  may include:  Bone pain.  Muscle pain.  Falling often.  Broken bones caused by a minor injury, due to osteoporosis. DIAGNOSIS A blood test is the best way to tell if you have a vitamin D deficiency. TREATMENT Vitamin D deficiency can be treated in different ways. Treatment for vitamin D deficiency depends on what is causing it. Options include:  Taking vitamin D supplements.  Taking a calcium supplement. Your caregiver will suggest what dose is best for you. HOME CARE INSTRUCTIONS  Take any supplements that your caregiver prescribes. Follow the directions carefully. Take only the suggested amount.  Have your blood tested 2 months after you start taking supplements.  Eat foods that contain vitamin D. Healthy choices include:  Fortified dairy products, cereals, or juices. Fortified means vitamin D has been added to the food. Check the label on the package to be sure.  Fatty fish like salmon or trout.  Eggs.  Oysters.  Do not use a tanning bed.  Keep your weight at a healthy level. Lose weight if you need to.  Keep all follow-up appointments. Your caregiver will need to perform blood tests to make sure your vitamin D deficiency is going away. SEEK MEDICAL CARE IF:  You have any questions about your treatment.  You continue to have symptoms of vitamin D deficiency.  You have nausea or vomiting.  You are constipated.  You feel confused.  You have severe abdominal or back pain. MAKE   SURE YOU:  Understand these instructions.  Will watch your condition.  Will get help right away if you are not doing well or get worse. Document Released: 02/01/2012 Document Revised: 03/06/2013 Document Reviewed: 02/01/2012 Exit[employer] Patient Information 2015 [employer]. This information is not intended to replace advice given to you by your health care provider. Make sure you discuss any questions you have with your health care provider.  

## 2015-05-17 NOTE — Progress Notes (Signed)
Subjective:    Patient ID: Nicole Peters, female    DOB: February 27, 1951, 64 y.o.   MRN: [id number]  05/17/2015  Follow-up and vitamin d def   HPI   Vitamin D deficiency:  Completed 12 weeks of vitamin D weekly therapy . Better energy; increased appetite; coloring better.  Completed therapy May 02, 2015.  No side effects to medication; due for repeat vitamin D level.  Thyroid function abnormal: due for repeat level.  Review of Systems  Constitutional: Negative for fever, chills, diaphoresis and fatigue.  Eyes: Negative for visual disturbance.  Respiratory: Negative for cough and shortness of breath.   Cardiovascular: Negative for chest pain, palpitations and leg swelling.  Gastrointestinal: Negative for nausea, vomiting, abdominal pain, diarrhea and constipation.  Endocrine: Negative for cold intolerance, heat intolerance, polydipsia, polyphagia and polyuria.  Neurological: Negative for dizziness, tremors, seizures, syncope, facial asymmetry, speech difficulty, weakness, light-headedness, numbness and headaches.    Past Medical History  Diagnosis Date  . Hyperlipidemia   . Hypertension   . Arthritis   . Depression   . Anxiety    Past Surgical History  Procedure Laterality Date  . Knee surgery      arthroscopic  . Appendectomy    . Cholecystectomy    . Tubal ligation    . Arthroscopic knee surgery      Both knees- Dr. Berenice Primas  . Colonoscopy  12/22/2007    Diverticulosis. [facility]/[place]. Repeat in 10 years.   No Known Allergies Current Outpatient Prescriptions  Medication Sig Dispense Refill  . ALPRAZolam (XANAX) 0.25 MG tablet Take 1- 1 1/2 tablets by mouth daily. 135 tablet 1  . aspirin 81 MG tablet Take 81 mg by mouth daily.    . benazepril (LOTENSIN) 5 MG tablet Take 1 tablet (5 mg total) by mouth daily. 90 tablet 3  . buPROPion (WELLBUTRIN XL) 300 MG 24 hr tablet Take 1 tablet (300 mg total) by mouth daily. 90 tablet 3  . cetirizine (ZYRTEC) 10 MG tablet Take  10 mg by mouth daily.    . meloxicam (MOBIC) 15 MG tablet Take 15 mg by mouth daily.    . Multiple Vitamin (MULTIVITAMIN) tablet Take 1 tablet by mouth daily.    . pravastatin (PRAVACHOL) 40 MG tablet Take 2 tablets every evening at bedtime. 180 tablet 3  . Vitamin D, Ergocalciferol, (DRISDOL) 50000 UNITS CAPS capsule Take 1 capsule (50,000 Units total) by mouth every 7 (seven) days. (Patient not taking: Reported on 05/17/2015) 12 capsule 0   No current facility-administered medications for this visit.       Objective:    BP 189/94 mmHg  Pulse 77  Temp(Src) 98.3 F (36.8 C) (Oral)  Resp 16  Ht 5\' 2"  (1.575 m)  Wt 179 lb 9.6 oz (81.466 kg)  BMI 32.84 kg/m2  SpO2 97% Physical Exam  Constitutional: She is oriented to person, place, and time. She appears well-developed and well-nourished. No distress.  HENT:  Head: Normocephalic and atraumatic.  Right Ear: External ear normal.  Left Ear: External ear normal.  Nose: Nose normal.  Mouth/Throat: Oropharynx is clear and moist.  Eyes: Conjunctivae and EOM are normal. Pupils are equal, round, and reactive to light.  Neck: Normal range of motion. Neck supple. Carotid bruit is not present. No thyromegaly present.  Cardiovascular: Normal rate, regular rhythm, normal heart sounds and intact distal pulses.  Exam reveals no gallop and no friction rub.   No murmur heard. Pulmonary/Chest: Effort normal and breath sounds  normal. She has no wheezes. She has no rales.  Abdominal: Soft. Bowel sounds are normal. She exhibits no distension and no mass. There is no tenderness. There is no rebound and no guarding.  Lymphadenopathy:    She has no cervical adenopathy.  Neurological: She is alert and oriented to person, place, and time. No cranial nerve deficit.  Skin: Skin is warm and dry. No rash noted. She is not diaphoretic. No erythema. No pallor.  Psychiatric: She has a normal mood and affect. Her behavior is normal.   Results for orders placed or  performed in visit on 81/77/11  Basic metabolic panel  Result Value Ref Range   Sodium 132 (L) 135 - 145 mEq/L   Potassium 4.5 3.5 - 5.3 mEq/L   Chloride 97 96 - 112 mEq/L   CO2 25 19 - 32 mEq/L   Glucose, Bld 79 70 - 99 mg/dL   BUN 11 6 - 23 mg/dL   Creat 0.79 0.50 - 1.10 mg/dL   Calcium 9.3 8.4 - 10.5 mg/dL  Vit D  25 hydroxy (rtn osteoporosis monitoring)  Result Value Ref Range   Vit D, 25-Hydroxy 26 (L) 30 - 100 ng/mL  TSH  Result Value Ref Range   TSH 3.373 0.350 - 4.500 uIU/mL  T4, free  Result Value Ref Range   Free T4 1.28 0.80 - 1.80 ng/dL       Assessment & Plan:   1. Vitamin D deficiency   2. Thyroid function study abnormality     No orders of the defined types were placed in this encounter.    No Follow-up on file.    Sayla Golonka Elayne Guerin, M.D. [facility] [address] [place], [place]  [address] [phone] phone [phone] fax

## 2015-05-18 LAB — BASIC METABOLIC PANEL
BUN: 11 mg/dL (ref 6–23)
CALCIUM: 9.3 mg/dL (ref 8.4–10.5)
CO2: 25 mEq/L (ref 19–32)
Chloride: 97 mEq/L (ref 96–112)
Creat: 0.79 mg/dL (ref 0.50–1.10)
GLUCOSE: 79 mg/dL (ref 70–99)
POTASSIUM: 4.5 meq/L (ref 3.5–5.3)
Sodium: 132 mEq/L — ABNORMAL LOW (ref 135–145)

## 2015-05-18 LAB — TSH: TSH: 3.373 u[IU]/mL (ref 0.350–4.500)

## 2015-05-18 LAB — T4, FREE: FREE T4: 1.28 ng/dL (ref 0.80–1.80)

## 2015-05-18 LAB — VITAMIN D 25 HYDROXY (VIT D DEFICIENCY, FRACTURES): Vit D, 25-Hydroxy: 26 ng/mL — ABNORMAL LOW (ref 30–100)

## 2015-06-24 ENCOUNTER — Encounter: Payer: Self-pay | Admitting: Family Medicine

## 2015-06-24 DIAGNOSIS — I10 Essential (primary) hypertension: Secondary | ICD-10-CM

## 2015-06-25 MED ORDER — BENAZEPRIL HCL 10 MG PO TABS: 10.0000 mg | ORAL_TABLET | Freq: Every day | ORAL | Status: DC

## 2015-08-12 ENCOUNTER — Encounter: Payer: Self-pay | Admitting: Family Medicine

## 2015-08-12 ENCOUNTER — Ambulatory Visit (INDEPENDENT_AMBULATORY_CARE_PROVIDER_SITE_OTHER): Payer: BLUE CROSS/BLUE SHIELD | Admitting: Family Medicine

## 2015-08-12 VITALS — BP 144/80 | HR 94 | Temp 98.9°F | Resp 16 | Ht 62.0 in | Wt 176.0 lb

## 2015-08-12 DIAGNOSIS — Z23 Encounter for immunization: Secondary | ICD-10-CM | POA: Diagnosis not present

## 2015-08-12 DIAGNOSIS — M170 Bilateral primary osteoarthritis of knee: Secondary | ICD-10-CM

## 2015-08-12 DIAGNOSIS — F329 Major depressive disorder, single episode, unspecified: Secondary | ICD-10-CM

## 2015-08-12 DIAGNOSIS — I10 Essential (primary) hypertension: Secondary | ICD-10-CM

## 2015-08-12 DIAGNOSIS — Z720 Tobacco use: Secondary | ICD-10-CM

## 2015-08-12 DIAGNOSIS — F419 Anxiety disorder, unspecified: Secondary | ICD-10-CM

## 2015-08-12 DIAGNOSIS — E7800 Pure hypercholesterolemia, unspecified: Secondary | ICD-10-CM

## 2015-08-12 DIAGNOSIS — F418 Other specified anxiety disorders: Secondary | ICD-10-CM

## 2015-08-12 DIAGNOSIS — E559 Vitamin D deficiency, unspecified: Secondary | ICD-10-CM | POA: Diagnosis not present

## 2015-08-12 LAB — CBC WITH DIFFERENTIAL/PLATELET
Basophils Absolute: 0 10*3/uL (ref 0.0–0.1)
Basophils Relative: 0 % (ref 0–1)
Eosinophils Absolute: 0.2 10*3/uL (ref 0.0–0.7)
Eosinophils Relative: 2 % (ref 0–5)
HCT: 43.3 % (ref 36.0–46.0)
HEMOGLOBIN: 14.8 g/dL (ref 12.0–15.0)
LYMPHS ABS: 1.9 10*3/uL (ref 0.7–4.0)
Lymphocytes Relative: 22 % (ref 12–46)
MCH: 31.8 pg (ref 26.0–34.0)
MCHC: 34.2 g/dL (ref 30.0–36.0)
MCV: 93.1 fL (ref 78.0–100.0)
MPV: 9.4 fL (ref 8.6–12.4)
Monocytes Absolute: 0.9 10*3/uL (ref 0.1–1.0)
Monocytes Relative: 10 % (ref 3–12)
NEUTROS ABS: 5.7 10*3/uL (ref 1.7–7.7)
NEUTROS PCT: 66 % (ref 43–77)
Platelets: 244 10*3/uL (ref 150–400)
RBC: 4.65 MIL/uL (ref 3.87–5.11)
RDW: 13.1 % (ref 11.5–15.5)
WBC: 8.7 10*3/uL (ref 4.0–10.5)

## 2015-08-12 LAB — COMPREHENSIVE METABOLIC PANEL
ALK PHOS: 57 U/L (ref 33–130)
ALT: 14 U/L (ref 6–29)
AST: 14 U/L (ref 10–35)
Albumin: 4.2 g/dL (ref 3.6–5.1)
BUN: 12 mg/dL (ref 7–25)
CHLORIDE: 103 mmol/L (ref 98–110)
CO2: 26 mmol/L (ref 20–31)
Calcium: 9.3 mg/dL (ref 8.6–10.4)
Creat: 0.79 mg/dL (ref 0.50–0.99)
Glucose, Bld: 91 mg/dL (ref 65–99)
POTASSIUM: 4.3 mmol/L (ref 3.5–5.3)
Sodium: 138 mmol/L (ref 135–146)
TOTAL PROTEIN: 6.6 g/dL (ref 6.1–8.1)
Total Bilirubin: 0.6 mg/dL (ref 0.2–1.2)

## 2015-08-12 LAB — LIPID PANEL
CHOL/HDL RATIO: 2.1 ratio (ref <=5.0)
CHOLESTEROL: 176 mg/dL (ref 125–200)
HDL: 85 mg/dL (ref >=46)
LDL Cholesterol: 73 mg/dL (ref <130)
TRIGLYCERIDES: 92 mg/dL (ref <150)
VLDL: 18 mg/dL (ref <30)

## 2015-08-12 MED ORDER — ALPRAZOLAM 0.25 MG PO TABS: ORAL_TABLET | ORAL | Status: DC

## 2015-08-12 NOTE — Progress Notes (Signed)
Subjective:    Patient ID: Nicole Peters, female    DOB: 1951-05-11, 64 y.o.   MRN: [id number]  08/12/2015  blood work and Immunizations   HPI This 64 y.o. female presents for three month follow-up:  1. HTN: Patient reports good compliance with medication, good tolerance to medication, and good symptom control.  142/73; ranges 120s/68 most the time.  Sometimes 130s.  Major stress lately.    2. Hyperlipidemia: Patient reports good compliance with medication, good tolerance to medication, and good symptom control.    3.  Depression and anxiety: Patient reports good compliance with medication, good tolerance to medication, and good symptom control.    Husband suffered with pancreatitis 05/31/15; admitted for one week; s/p cholecystectomy.  Lost 16 pounds in six days.  Still not at work.  Now walking one mile per day.  Now taking 1.5 Xanax per day.    Increased tobacco intake to 1/2 ppd for a while; now down to 1/4 ppd.  [facility] on [address]; cannot tolerate massage; with arthritis in knees, needs to cancel a membership.    4.  Vitamin D deficiency: Patient reports good compliance with medication, good tolerance to medication, and good symptom control.    5. Health Maintenance:  Due for TDAP; received flu vaccine at pharmacy over the weekend.  S/p mammogram at [facility] 08/2014; not in chart.   Review of Systems  Constitutional: Negative for fever, chills, diaphoresis and fatigue.  Eyes: Negative for visual disturbance.  Respiratory: Negative for cough and shortness of breath.   Cardiovascular: Negative for chest pain, palpitations and leg swelling.  Gastrointestinal: Negative for nausea, vomiting, abdominal pain, diarrhea and constipation.  Endocrine: Negative for cold intolerance, heat intolerance, polydipsia, polyphagia and polyuria.  Neurological: Negative for dizziness, tremors, seizures, syncope, facial asymmetry, speech difficulty, weakness, light-headedness, numbness  and headaches.  Psychiatric/Behavioral: Positive for sleep disturbance. Negative for suicidal ideas, self-injury and dysphoric mood. The patient is nervous/anxious.     Past Medical History  Diagnosis Date  . Hyperlipidemia   . Hypertension   . Arthritis   . Depression   . Anxiety    Past Surgical History  Procedure Laterality Date  . Knee surgery      arthroscopic  . Appendectomy    . Cholecystectomy    . Tubal ligation    . Arthroscopic knee surgery      Both knees- Dr. Berenice Primas  . Colonoscopy  12/22/2007    Diverticulosis. [facility]/[place]. Repeat in 10 years.   No Known Allergies Current Outpatient Prescriptions  Medication Sig Dispense Refill  . ALPRAZolam (XANAX) 0.25 MG tablet Take 1- 1 1/2 tablets by mouth daily. 135 tablet 1  . aspirin 81 MG tablet Take 81 mg by mouth daily.    . benazepril (LOTENSIN) 10 MG tablet Take 1 tablet (10 mg total) by mouth daily. 90 tablet 3  . buPROPion (WELLBUTRIN XL) 300 MG 24 hr tablet Take 1 tablet (300 mg total) by mouth daily. 90 tablet 3  . cetirizine (ZYRTEC) 10 MG tablet Take 10 mg by mouth daily.    . cholecalciferol (VITAMIN D) 1000 UNITS tablet Take 1,000 Units by mouth daily.    . Multiple Vitamin (MULTIVITAMIN) tablet Take 1 tablet by mouth daily.    . pravastatin (PRAVACHOL) 40 MG tablet Take 2 tablets every evening at bedtime. 180 tablet 3   No current facility-administered medications for this visit.   Social History   Social History  . Marital Status:  Married    Spouse Name: Merry Proud  . Number of Children: N/A  . Years of Education: N/A   Occupational History  . Not on file.   Social History Main Topics  . Smoking status: Former Smoker -- 0.25 packs/day    Types: Cigarettes  . Smokeless tobacco: Not on file     Comment: quit January 22, 2015  . Alcohol Use: No  . Drug Use: No  . Sexual Activity: Yes   Other Topics Concern  . Not on file   Social History Narrative   Marital status: married x 30 years;  second husband ;happily; no abuse.      Children: one child (73); one grandchild (9yo)      Lives: with husband.      Employment:  Agricultural engineer.  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      Tobacco:  5 cigarettes per day.  Smoking since age 50.  Quit in 01/2015.      Alcohol:  None      Drugs: none      Exercise: some walking daily; exercising dedicated 3 days per week.       Seatbelt:  100%       Guns:  Loaded; secured.   Family History  Problem Relation Age of Onset  . Cancer Father     stomach cancer with liver mets  . Heart disease Brother     mild heart attack       Objective:    BP 144/80 mmHg  Pulse 94  Temp(Src) 98.9 F (37.2 C) (Oral)  Resp 16  Ht 5\' 2"  (1.575 m)  Wt 176 lb (79.833 kg)  BMI 32.18 kg/m2 Physical Exam  Constitutional: She is oriented to person, place, and time. She appears well-developed and well-nourished. No distress.  HENT:  Head: Normocephalic and atraumatic.  Right Ear: External ear normal.  Left Ear: External ear normal.  Nose: Nose normal.  Mouth/Throat: Oropharynx is clear and moist.  Eyes: Conjunctivae and EOM are normal. Pupils are equal, round, and reactive to light.  Neck: Normal range of motion. Neck supple. Carotid bruit is not present. No thyromegaly present.  Cardiovascular: Normal rate, regular rhythm, normal heart sounds and intact distal pulses.  Exam reveals no gallop and no friction rub.   No murmur heard. Pulmonary/Chest: Effort normal and breath sounds normal. She has no wheezes. She has no rales.  Abdominal: Soft. Bowel sounds are normal. She exhibits no distension and no mass. There is no tenderness. There is no rebound and no guarding.  Lymphadenopathy:    She has no cervical adenopathy.  Neurological: She is alert and oriented to person, place, and time. No cranial nerve deficit.  Skin: Skin is warm and dry. No rash noted. She is not diaphoretic. No erythema. No pallor.  Psychiatric: She has a normal mood and affect.  Her behavior is normal.   Results for orders placed or performed in visit on 41/93/79  Basic metabolic panel  Result Value Ref Range   Sodium 132 (L) 135 - 145 mEq/L   Potassium 4.5 3.5 - 5.3 mEq/L   Chloride 97 96 - 112 mEq/L   CO2 25 19 - 32 mEq/L   Glucose, Bld 79 70 - 99 mg/dL   BUN 11 6 - 23 mg/dL   Creat 0.79 0.50 - 1.10 mg/dL   Calcium 9.3 8.4 - 10.5 mg/dL  Vit D  25 hydroxy (rtn osteoporosis monitoring)  Result Value Ref Range   Vit D, 25-Hydroxy 26 (  L) 30 - 100 ng/mL  TSH  Result Value Ref Range   TSH 3.373 0.350 - 4.500 uIU/mL  T4, free  Result Value Ref Range   Free T4 1.28 0.80 - 1.80 ng/dL   TDAP ADMINISTERED.    Assessment & Plan:   1. Essential hypertension   2. Depression with anxiety   3. Pure hypercholesterolemia   4. Vitamin D deficiency   5. Anxiety and depression   6. Need for Tdap vaccination     1. HTN: improved control; obtain labs; no change in management; known White Coat Syndrome.  Home readings improved. 2. Depression with anxiety; worsening due to husband's decline in health; coping well; increased Xanax use; refill of Xanax provided.  3.  Hypercholesterolemia: controlled; obtain labs; continue current medications. 4.  Vitamin D deficiency: improved but uncontrolled; repeat level today; continue vitamin D supplement OTC. 5.  S/p TDAP 6. Tobacco abuse: increased use with stressors; encourage cessation. 7. OA knees: Persistent; pt requesting note for [facility] excusing from massage membership; note provided.   Orders Placed This Encounter  Procedures  . Tdap vaccine greater than or equal to 7yo IM  . CBC with Differential/Platelet  . Comprehensive metabolic panel    Order Specific Question:  Has the patient fasted?    Answer:  Yes  . Lipid panel    Order Specific Question:  Has the patient fasted?    Answer:  Yes  . Vit D  25 hydroxy (rtn osteoporosis monitoring)   Meds ordered this encounter  Medications  . cholecalciferol  (VITAMIN D) 1000 UNITS tablet    Sig: Take 1,000 Units by mouth daily.  Marland Kitchen ALPRAZolam (XANAX) 0.25 MG tablet    Sig: Take 1- 1 1/2 tablets by mouth daily.    Dispense:  135 tablet    Refill:  1    135 tablets must last 3 months.    Return in about 6 months (around 02/09/2016) for complete physical examiniation.    Kristi Elayne Guerin, M.D. [facility] [address] [place], [place]  [address] [phone] phone [phone] fax

## 2015-08-12 NOTE — Patient Instructions (Signed)

## 2015-08-13 LAB — VITAMIN D 25 HYDROXY (VIT D DEFICIENCY, FRACTURES): Vit D, 25-Hydroxy: 27 ng/mL — ABNORMAL LOW (ref 30–100)

## 2015-09-09 LAB — HM MAMMOGRAPHY

## 2015-09-18 ENCOUNTER — Encounter: Payer: Self-pay | Admitting: Family Medicine

## 2016-01-28 ENCOUNTER — Other Ambulatory Visit: Payer: Self-pay | Admitting: Family Medicine

## 2016-01-29 ENCOUNTER — Telehealth: Payer: Self-pay | Admitting: Family Medicine

## 2016-01-29 NOTE — Telephone Encounter (Signed)
Dr Tamala Julian pt stopped by to update her insurance cards and to advise you that she no longer uses [facility] but will start using [facility] [address] if you get any request to [facility] to [facility]

## 2016-01-29 NOTE — Telephone Encounter (Signed)
Ok noted  

## 2016-02-14 ENCOUNTER — Encounter: Payer: BLUE CROSS/BLUE SHIELD | Admitting: Family Medicine

## 2016-02-20 DIAGNOSIS — M1712 Unilateral primary osteoarthritis, left knee: Secondary | ICD-10-CM | POA: Diagnosis not present

## 2016-02-20 DIAGNOSIS — M1711 Unilateral primary osteoarthritis, right knee: Secondary | ICD-10-CM | POA: Diagnosis not present

## 2016-02-26 ENCOUNTER — Other Ambulatory Visit: Payer: Self-pay | Admitting: Orthopedic Surgery

## 2016-03-03 ENCOUNTER — Ambulatory Visit (INDEPENDENT_AMBULATORY_CARE_PROVIDER_SITE_OTHER): Payer: Medicare Other | Admitting: Family Medicine

## 2016-03-03 ENCOUNTER — Encounter: Payer: Self-pay | Admitting: Family Medicine

## 2016-03-03 VITALS — BP 152/86 | HR 96 | Temp 98.2°F | Resp 16 | Ht 62.5 in | Wt 181.6 lb

## 2016-03-03 DIAGNOSIS — M170 Bilateral primary osteoarthritis of knee: Secondary | ICD-10-CM | POA: Diagnosis not present

## 2016-03-03 DIAGNOSIS — F418 Other specified anxiety disorders: Secondary | ICD-10-CM | POA: Diagnosis not present

## 2016-03-03 DIAGNOSIS — E669 Obesity, unspecified: Secondary | ICD-10-CM | POA: Diagnosis not present

## 2016-03-03 DIAGNOSIS — Z720 Tobacco use: Secondary | ICD-10-CM

## 2016-03-03 DIAGNOSIS — Z23 Encounter for immunization: Secondary | ICD-10-CM

## 2016-03-03 DIAGNOSIS — E559 Vitamin D deficiency, unspecified: Secondary | ICD-10-CM

## 2016-03-03 DIAGNOSIS — E7800 Pure hypercholesterolemia, unspecified: Secondary | ICD-10-CM | POA: Diagnosis not present

## 2016-03-03 DIAGNOSIS — Z0000 Encounter for general adult medical examination without abnormal findings: Secondary | ICD-10-CM

## 2016-03-03 DIAGNOSIS — I10 Essential (primary) hypertension: Secondary | ICD-10-CM | POA: Diagnosis not present

## 2016-03-03 DIAGNOSIS — E66811 Obesity, class 1: Secondary | ICD-10-CM

## 2016-03-03 LAB — LIPID PANEL
Cholesterol: 174 mg/dL (ref 125–200)
HDL: 76 mg/dL (ref >=46)
LDL Cholesterol: 77 mg/dL (ref <130)
Total CHOL/HDL Ratio: 2.3 Ratio (ref <=5.0)
Triglycerides: 103 mg/dL (ref <150)
VLDL: 21 mg/dL (ref <30)

## 2016-03-03 LAB — CBC WITH DIFFERENTIAL/PLATELET
BASOS ABS: 74 {cells}/uL (ref 0–200)
Basophils Relative: 1 %
Eosinophils Absolute: 148 cells/uL (ref 15–500)
Eosinophils Relative: 2 %
HEMATOCRIT: 45.5 % — AB (ref 35.0–45.0)
HEMOGLOBIN: 15.4 g/dL (ref 11.7–15.5)
LYMPHS ABS: 1924 {cells}/uL (ref 850–3900)
LYMPHS PCT: 26 %
MCH: 31.3 pg (ref 27.0–33.0)
MCHC: 33.8 g/dL (ref 32.0–36.0)
MCV: 92.5 fL (ref 80.0–100.0)
MONO ABS: 518 {cells}/uL (ref 200–950)
MPV: 9.9 fL (ref 7.5–12.5)
Monocytes Relative: 7 %
NEUTROS PCT: 64 %
Neutro Abs: 4736 cells/uL (ref 1500–7800)
Platelets: 211 10*3/uL (ref 140–400)
RBC: 4.92 MIL/uL (ref 3.80–5.10)
RDW: 13.3 % (ref 11.0–15.0)
WBC: 7.4 10*3/uL (ref 3.8–10.8)

## 2016-03-03 LAB — COMPREHENSIVE METABOLIC PANEL
ALBUMIN: 4 g/dL (ref 3.6–5.1)
ALT: 12 U/L (ref 6–29)
AST: 18 U/L (ref 10–35)
Alkaline Phosphatase: 50 U/L (ref 33–130)
BUN: 14 mg/dL (ref 7–25)
CALCIUM: 9.2 mg/dL (ref 8.6–10.4)
CHLORIDE: 102 mmol/L (ref 98–110)
CO2: 25 mmol/L (ref 20–31)
Creat: 0.76 mg/dL (ref 0.50–0.99)
GLUCOSE: 89 mg/dL (ref 65–99)
POTASSIUM: 4.3 mmol/L (ref 3.5–5.3)
Sodium: 138 mmol/L (ref 135–146)
Total Bilirubin: 0.6 mg/dL (ref 0.2–1.2)
Total Protein: 6.5 g/dL (ref 6.1–8.1)

## 2016-03-03 LAB — POCT URINALYSIS DIP (MANUAL ENTRY)
BILIRUBIN UA: NEGATIVE
Bilirubin, UA: NEGATIVE
GLUCOSE UA: NEGATIVE
Leukocytes, UA: NEGATIVE
Nitrite, UA: NEGATIVE
PROTEIN UA: NEGATIVE
SPEC GRAV UA: 1.02
UROBILINOGEN UA: 0.2
pH, UA: 6.5

## 2016-03-03 MED ORDER — BENAZEPRIL HCL 20 MG PO TABS: 20.0000 mg | ORAL_TABLET | Freq: Every day | ORAL | Status: DC

## 2016-03-03 MED ORDER — PRAVASTATIN SODIUM 80 MG PO TABS: 80.0000 mg | ORAL_TABLET | Freq: Every day | ORAL | Status: DC

## 2016-03-03 MED ORDER — BUPROPION HCL ER (XL) 300 MG PO TB24: 300.0000 mg | ORAL_TABLET | Freq: Every day | ORAL | Status: DC

## 2016-03-03 NOTE — Patient Instructions (Addendum)
Keeping You Healthy  Get These Tests  Blood Pressure- Have your blood pressure checked by your healthcare provider at least once a year.  Normal blood pressure is 120/80.  Weight- Have your body mass index (BMI) calculated to screen for obesity.  BMI is a measure of body fat based on height and weight.  You can calculate your own BMI at www.nhlbisupport.com/bmi/  Cholesterol- Have your cholesterol checked every year.  Diabetes- Have your blood sugar checked every year if you have high blood pressure, high cholesterol, a family history of diabetes or if you are overweight.  Pap Test - Have a pap test every 1 to 5 years if you have been sexually active.  If you are older than 65 and recent pap tests have been normal you may not need additional pap tests.  In addition, if you have had a hysterectomy  for benign disease additional pap tests are not necessary.  Mammogram-Yearly mammograms are essential for early detection of breast cancer  Screening for Colon Cancer- Colonoscopy starting at age 50. Screening may begin sooner depending on your family history and other health conditions.  Follow up colonoscopy as directed by your Gastroenterologist.  Screening for Osteoporosis- Screening begins at age 65 with bone density scanning, sooner if you are at higher risk for developing Osteoporosis.  Get these medicines  Calcium with Vitamin D- Your body requires 1200-1500 mg of Calcium a day and 800-1000 IU of Vitamin D a day.  You can only absorb 500 mg of Calcium at a time therefore Calcium must be taken in 2 or 3 separate doses throughout the day.  Hormones- Hormone therapy has been associated with increased risk for certain cancers and heart disease.  Talk to your healthcare provider about if you need relief from menopausal symptoms.  Aspirin- Ask your healthcare provider about taking Aspirin to prevent Heart Disease and Stroke.  Get these Immuniztions  Flu shot- Every fall  Pneumonia shot-  Once after the age of 65; if you are younger ask your healthcare provider if you need a pneumonia shot.  Tetanus- Every ten years.  Zostavax- Once after the age of 60 to prevent shingles.  Take these steps  Don't smoke- Your healthcare provider can help you quit. For tips on how to quit, ask your healthcare provider or go to www.smokefree.gov or call 1-800 QUIT-NOW.  Be physically active- Exercise 5 days a week for a minimum of 30 minutes.  If you are not already physically active, start slow and gradually work up to 30 minutes of moderate physical activity.  Try walking, dancing, bike riding, swimming, etc.  Eat a healthy diet- Eat a variety of healthy foods such as fruits, vegetables, whole grains, low fat milk, low fat cheeses, yogurt, lean meats, chicken, fish, eggs, dried beans, tofu, etc.  For more information go to www.thenutritionsource.org  Dental visit- Brush and floss teeth twice daily; visit your dentist twice a year.  Eye exam- Visit your Optometrist or Ophthalmologist yearly.  Drink alcohol in moderation- Limit alcohol intake to one drink or less a day.  Never drink and drive.  Depression- Your emotional health is as important as your physical health.  If you're feeling down or losing interest in things you normally enjoy, please talk to your healthcare provider.  Seat Belts- can save your life; always wear one  Smoke/Carbon Monoxide detectors- These detectors need to be installed on the appropriate level of your home.  Replace batteries at least once a year.  Violence- If   anyone is threatening or hurting you, please tell your healthcare provider. Living Will/ Health care power of attorney- Discuss with your healthcare provider and family.    IF you received an x-ray today, you will receive an invoice from [facility]. Please contact [facility] at [phone] with questions or concerns regarding your invoice.   IF you received labwork today, you will  receive an invoice from [facility] [employer]. Please contact [facility] at [phone] with questions or concerns regarding your invoice.   Our billing staff will not be able to assist you with questions regarding bills from these companies.  You will be contacted with the lab results as soon as they are available. The fastest way to get your results is to activate your My Chart account. Instructions are located on the last page of this paperwork. If you have not heard from [place] regarding the results in 2 weeks, please contact this office.    Keeping You Healthy  Get These Tests Blood Pressure- Have your blood pressure checked by your healthcare provider at least once a year.  Normal blood pressure is 120/80. Weight- Have your body mass index (BMI) calculated to screen for obesity.  BMI is a measure of body fat based on height and weight.  You can calculate your own BMI at GravelBags.it Cholesterol- Have your cholesterol checked every year. Diabetes- Have your blood sugar checked every year if you have high blood pressure, high cholesterol, a family history of diabetes or if you are overweight. Pap Test - Have a pap test every 1 to 5 years if you have been sexually active.  If you are older than 65 and recent pap tests have been normal you may not need additional pap tests.  In addition, if you have had a hysterectomy  for benign disease additional pap tests are not necessary. Mammogram-Yearly mammograms are essential for early detection of breast cancer Screening for Colon Cancer- Colonoscopy starting at age 86. Screening may begin sooner depending on your family history and other health conditions.  Follow up colonoscopy as directed by your Gastroenterologist. Screening for Osteoporosis- Screening begins at age 7 with bone density scanning, sooner if you are at higher risk for developing Osteoporosis.  Get these medicines Calcium with Vitamin D- Your body requires  1200-1500 mg of Calcium a day and [phone] IU of Vitamin D a day.  You can only absorb 500 mg of Calcium at a time therefore Calcium must be taken in 2 or 3 separate doses throughout the day. Hormones- Hormone therapy has been associated with increased risk for certain cancers and heart disease.  Talk to your healthcare provider about if you need relief from menopausal symptoms. Aspirin- Ask your healthcare provider about taking Aspirin to prevent Heart Disease and Stroke.  Get these Immuniztions Flu shot- Every fall Pneumonia shot- Once after the age of 76; if you are younger ask your healthcare provider if you need a pneumonia shot. Tetanus- Every ten years. Zostavax- Once after the age of 30 to prevent shingles.  Take these steps Don't smoke- Your healthcare provider can help you quit. For tips on how to quit, ask your healthcare provider or go to www.smokefree.gov or call 1-800 QUIT-NOW. Be physically active- Exercise 5 days a week for a minimum of 30 minutes.  If you are not already physically active, start slow and gradually work up to 30 minutes of moderate physical activity.  Try walking, dancing, bike riding, swimming, etc. Eat a healthy diet- Eat a variety  of healthy foods such as fruits, vegetables, whole grains, low fat milk, low fat cheeses, yogurt, lean meats, chicken, fish, eggs, dried beans, tofu, etc.  For more information go to www.thenutritionsource.org Dental visit- Brush and floss teeth twice daily; visit your dentist twice a year. Eye exam- Visit your Optometrist or Ophthalmologist yearly. Drink alcohol in moderation- Limit alcohol intake to one drink or less a day.  Never drink and drive. Depression- Your emotional health is as important as your physical health.  If you're feeling down or losing interest in things you normally enjoy, please talk to your healthcare provider. Seat Belts- can save your life; always wear one Smoke/Carbon Monoxide detectors- These detectors need  to be installed on the appropriate level of your home.  Replace batteries at least once a year. Violence- If anyone is threatening or hurting you, please tell your healthcare provider. Living Will/ Health care power of attorney- Discuss with your healthcare provider and family.

## 2016-03-03 NOTE — Progress Notes (Signed)
Subjective:    Patient ID: Nicole Peters, female    DOB: 02-26-1951, 66 y.o.   MRN: [id number]  03/03/2016  Annual Exam and Medication Refill   HPI This 65 y.o. female presents for Welcome to Medicare Physical.  Last physical:  2016 Pap smear:  02-05-2014 Mammogram:  09-09-2015 Colonoscopy:  12-22-2007 Bone density:  2011 TDAP:  2016 Pneumovax:  2011;  Zostavax:  04-10-2015 Influenza:  08-10-2015 Eye exam:  +glasses; every year. Dental exam:  Every four months.  HTN: Patient reports good compliance with medication, good tolerance to medication, and good symptom control.  Home BPs running 140/68-82.    Hyperlipidemia: Patient reports good compliance with medication, good tolerance to medication, and good symptom control.    Anxiety and depression: Patient reports good compliance with medication, good tolerance to medication, and good symptom control.  Taking Xanax 1.5 tablets daily.  Vitamin D deficiency: taking 2000 IU daily.  OA knee: scheduled for R total knee replacement on 03/16/2016.   Review of Systems  Constitutional: Negative for fever, chills, diaphoresis, activity change, appetite change, fatigue and unexpected weight change.  HENT: Negative for congestion, dental problem, drooling, ear discharge, ear pain, facial swelling, hearing loss, mouth sores, nosebleeds, postnasal drip, rhinorrhea, sinus pressure, sneezing, sore throat, tinnitus, trouble swallowing and voice change.   Eyes: Negative for photophobia, pain, discharge, redness, itching and visual disturbance.  Respiratory: Negative for apnea, cough, choking, chest tightness, shortness of breath, wheezing and stridor.   Cardiovascular: Negative for chest pain, palpitations and leg swelling.  Gastrointestinal: Negative for nausea, vomiting, abdominal pain, diarrhea, constipation, blood in stool, abdominal distention, anal bleeding and rectal pain.  Endocrine: Negative for cold intolerance, heat intolerance,  polydipsia, polyphagia and polyuria.  Genitourinary: Negative for dysuria, urgency, frequency, hematuria, flank pain, decreased urine volume, vaginal bleeding, vaginal discharge, enuresis, difficulty urinating, genital sores, vaginal pain, menstrual problem, pelvic pain and dyspareunia.       Nocturia x 1-3.  Stress incontinence sometimes.  Musculoskeletal: Negative for myalgias, back pain, joint swelling, arthralgias, gait problem, neck pain and neck stiffness.  Skin: Negative for color change, pallor, rash and wound.  Allergic/Immunologic: Negative for environmental allergies, food allergies and immunocompromised state.  Neurological: Negative for dizziness, tremors, seizures, syncope, facial asymmetry, speech difficulty, weakness, light-headedness, numbness and headaches.  Hematological: Negative for adenopathy. Does not bruise/bleed easily.  Psychiatric/Behavioral: Negative for suicidal ideas, hallucinations, behavioral problems, confusion, sleep disturbance, self-injury, dysphoric mood, decreased concentration and agitation. The patient is not nervous/anxious and is not hyperactive.        Bedtime 12:11midnight; wakes up 8:30am.     Past Medical History  Diagnosis Date  . Hyperlipidemia   . Hypertension   . Arthritis   . Depression   . Anxiety    Past Surgical History  Procedure Laterality Date  . Knee surgery      arthroscopic  . Appendectomy    . Cholecystectomy    . Tubal ligation    . Arthroscopic knee surgery      Both knees- Dr. Berenice Primas  . Colonoscopy  12/22/2007    Diverticulosis. Robert Kaplan/[place]. Repeat in 10 years.   No Known Allergies Current Outpatient Prescriptions  Medication Sig Dispense Refill  . acetaminophen (TYLENOL) 500 MG tablet Take 1,000 mg by mouth every 8 (eight) hours as needed for mild pain or moderate pain.    Marland Kitchen ALPRAZolam (XANAX) 0.25 MG tablet Take 1- 1 1/2 tablets by mouth daily. 135 tablet 1  . aspirin 81 MG  tablet Take 81 mg by mouth  daily.    . benazepril (LOTENSIN) 20 MG tablet Take 1 tablet (20 mg total) by mouth daily. 90 tablet 3  . buPROPion (WELLBUTRIN XL) 300 MG 24 hr tablet Take 1 tablet (300 mg total) by mouth daily. 90 tablet 3  . cetirizine (ZYRTEC) 10 MG tablet Take 10 mg by mouth daily as needed for allergies.     . Cholecalciferol (VITAMIN D) 2000 units tablet Take 2,000 Units by mouth daily.    . Multiple Vitamin (MULTIVITAMIN) tablet Take 1 tablet by mouth daily.    . pravastatin (PRAVACHOL) 80 MG tablet Take 1 tablet (80 mg total) by mouth at bedtime. 90 tablet 3   No current facility-administered medications for this visit.   Social History   Social History  . Marital Status: Married    Spouse Name: Merry Proud  . Number of Children: N/A  . Years of Education: N/A   Occupational History  . Not on file.   Social History Main Topics  . Smoking status: Former Smoker -- 0.25 packs/day    Types: Cigarettes  . Smokeless tobacco: Not on file     Comment: quit January 22, 2015 - per patient smoker sometimes  . Alcohol Use: No  . Drug Use: No  . Sexual Activity: Yes   Other Topics Concern  . Not on file   Social History Narrative   Marital status: married x 31 years; second husband ;happily; no abuse.      Children: one child (36); one grandchild (31yo)      Lives: with husband.      Employment:  Agricultural engineer.  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      Tobacco:  5 cigarettes per day.  Smoking since age 87.  Quit in 01/2015.      Alcohol:  None      Drugs: none      Exercise: no exercise due to R oasteoarthritis knee.       Seatbelt:  100%       Guns:  Loaded; secured.      ADLs: indepdendent with ADLs.   Family History  Problem Relation Age of Onset  . Cancer Father     stomach cancer with liver mets  . Heart disease Brother     mild heart attack       Objective:    BP 152/86 mmHg  Pulse 96  Temp(Src) 98.2 F (36.8 C) (Oral)  Resp 16  Ht 5' 2.5" (1.588 m)  Wt 181 lb 9.6 oz (82.373  kg)  BMI 32.67 kg/m2  SpO2 91% Physical Exam  Constitutional: She is oriented to person, place, and time. She appears well-developed and well-nourished. No distress.  HENT:  Head: Normocephalic and atraumatic.  Right Ear: External ear normal.  Left Ear: External ear normal.  Nose: Nose normal.  Mouth/Throat: Oropharynx is clear and moist.  Eyes: Conjunctivae and EOM are normal. Pupils are equal, round, and reactive to light.  Neck: Normal range of motion and full passive range of motion without pain. Neck supple. No JVD present. Carotid bruit is not present. No thyromegaly present.  Cardiovascular: Normal rate, regular rhythm and normal heart sounds.  Exam reveals no gallop and no friction rub.   No murmur heard. Pulmonary/Chest: Effort normal and breath sounds normal. She has no wheezes. She has no rales.  Abdominal: Soft. Bowel sounds are normal. She exhibits no distension and no mass. There is no tenderness. There is no rebound and  no guarding.  Musculoskeletal:       Right shoulder: Normal.       Left shoulder: Normal.       Cervical back: Normal.  Lymphadenopathy:    She has no cervical adenopathy.  Neurological: She is alert and oriented to person, place, and time. She has normal reflexes. No cranial nerve deficit. She exhibits normal muscle tone. Coordination normal.  Skin: Skin is warm and dry. No rash noted. She is not diaphoretic. No erythema. No pallor.  Psychiatric: She has a normal mood and affect. Her behavior is normal. Judgment and thought content normal.  Nursing note and vitals reviewed.  Results for orders placed or performed in visit on 03/03/16  POCT urinalysis dipstick  Result Value Ref Range   Color, UA yellow yellow   Clarity, UA clear clear   Glucose, UA negative negative   Bilirubin, UA negative negative   Ketones, POC UA negative negative   Spec Grav, UA 1.020    Blood, UA small (A) negative   pH, UA 6.5    Protein Ur, POC negative negative    Urobilinogen, UA 0.2    Nitrite, UA Negative Negative   Leukocytes, UA Negative Negative   Depression screen [facility] 2/9 03/03/2016 05/17/2015 02/11/2015 08/13/2014  Decreased Interest 0 0 0 0  Down, Depressed, Hopeless 0 0 0 0  PHQ - 2 Score 0 0 0 0   Fall Risk  03/03/2016 02/11/2015 08/13/2014  Falls in the past year? No No Yes  Number falls in past yr: - - 1  Injury with Fall? - - Yes  Functional Status Survey:      Assessment & Plan:   1. Welcome to Medicare preventive visit   2. Primary osteoarthritis of both knees   3. Pure hypercholesterolemia   4. Depression with anxiety   5. Tobacco user   6. Essential hypertension   7. Obesity (BMI 30.0-34.9)   8. Vitamin D deficiency     Orders Placed This Encounter  Procedures  . Pneumococcal conjugate vaccine 13-valent IM  . CBC with Differential/Platelet  . Comprehensive metabolic panel    Order Specific Question:  Has the patient fasted?    Answer:  Yes  . Lipid panel    Order Specific Question:  Has the patient fasted?    Answer:  Yes  . POCT urinalysis dipstick  . EKG 12-Lead   Meds ordered this encounter  Medications  . pravastatin (PRAVACHOL) 80 MG tablet    Sig: Take 1 tablet (80 mg total) by mouth at bedtime.    Dispense:  90 tablet    Refill:  3  . benazepril (LOTENSIN) 20 MG tablet    Sig: Take 1 tablet (20 mg total) by mouth daily.    Dispense:  90 tablet    Refill:  3  . buPROPion (WELLBUTRIN XL) 300 MG 24 hr tablet    Sig: Take 1 tablet (300 mg total) by mouth daily.    Dispense:  90 tablet    Refill:  3    Return in about 6 months (around 09/02/2016) for recheck high blood pressure.    Napolean Sia Elayne Guerin, M.D. [facility] [address] [place], [place]  [address] [phone] phone [phone] fax

## 2016-03-05 ENCOUNTER — Encounter (HOSPITAL_COMMUNITY)
Admission: RE | Admit: 2016-03-05 | Discharge: 2016-03-05 | Disposition: A | Discharge status: Home / Self Care | Payer: Medicare Other | Source: Ambulatory Visit | Attending: Orthopedic Surgery | Admitting: Orthopedic Surgery

## 2016-03-05 ENCOUNTER — Ambulatory Visit (HOSPITAL_COMMUNITY)
Admission: RE | Admit: 2016-03-05 | Discharge: 2016-03-05 | Disposition: A | Discharge status: Home / Self Care | Payer: Medicare Other | Source: Ambulatory Visit | Attending: Orthopedic Surgery | Admitting: Orthopedic Surgery

## 2016-03-05 ENCOUNTER — Encounter (HOSPITAL_COMMUNITY): Payer: Self-pay

## 2016-03-05 DIAGNOSIS — Z7982 Long term (current) use of aspirin: Secondary | ICD-10-CM | POA: Diagnosis not present

## 2016-03-05 DIAGNOSIS — I10 Essential (primary) hypertension: Secondary | ICD-10-CM | POA: Insufficient documentation

## 2016-03-05 DIAGNOSIS — E7800 Pure hypercholesterolemia, unspecified: Secondary | ICD-10-CM | POA: Insufficient documentation

## 2016-03-05 DIAGNOSIS — Z79899 Other long term (current) drug therapy: Secondary | ICD-10-CM | POA: Insufficient documentation

## 2016-03-05 DIAGNOSIS — Z01818 Encounter for other preprocedural examination: Secondary | ICD-10-CM | POA: Insufficient documentation

## 2016-03-05 DIAGNOSIS — Z01812 Encounter for preprocedural laboratory examination: Secondary | ICD-10-CM | POA: Insufficient documentation

## 2016-03-05 DIAGNOSIS — Z720 Tobacco use: Secondary | ICD-10-CM | POA: Insufficient documentation

## 2016-03-05 HISTORY — DX: Stress incontinence (female) (male): N39.3

## 2016-03-05 HISTORY — DX: Personal history of other diseases of the respiratory system: Z87.09

## 2016-03-05 LAB — ABO/RH: ABO/RH(D): A POS

## 2016-03-05 LAB — TYPE AND SCREEN
ABO/RH(D): A POS
Antibody Screen: NEGATIVE

## 2016-03-05 LAB — PROTIME-INR
INR: 1.06 (ref 0.00–1.49)
PROTHROMBIN TIME: 14 s (ref 11.6–15.2)

## 2016-03-05 LAB — APTT: APTT: 30 s (ref 24–37)

## 2016-03-05 LAB — SURGICAL PCR SCREEN
MRSA, PCR: NEGATIVE
STAPHYLOCOCCUS AUREUS: NEGATIVE

## 2016-03-05 NOTE — Pre-Procedure Instructions (Signed)
    [name]  03/05/2016      [id number] - [place], [facility] [address] [place] [address] Phone: [phone] Fax: [phone]    Your procedure is scheduled on Monday, April 24th, 2017.  Report to [facility] Admitting at 5:30 A.M.   Call this number if you have problems the morning of surgery:  [phone]   Remember:  Do not eat food or drink liquids after midnight.   Take these medicines the morning of surgery with A SIP OF WATER: Acetaminophen (Tylenol) if needed, Alprazolam (Xanax), Bupropion (Wellbutrin), Cetirizine (Zyrtec).  7 days prior to surgery, stop taking: Aspirin, NSAIDS, Aleve, Naproxen, Ibuprofen, Advil, Motrin, BC's, Goody's, Fish oil, all herbal medications, and all vitamins.     Do not wear jewelry, make-up or nail polish.  Do not wear lotions, powders, or perfumes.    Do not shave 48 hours prior to surgery.   Do not bring valuables to the hospital.   [facility] is not responsible for any belongings or valuables.  Contacts, dentures or bridgework may not be worn into surgery.  Leave your suitcase in the car.  After surgery it may be brought to your room.  For patients admitted to the hospital, discharge time will be determined by your treatment team.  Patients discharged the day of surgery will not be allowed to drive home.   Special instructions:  See attached.   Please read over the following fact sheets that you were given. Pain Booklet, Coughing and Deep Breathing, Blood Transfusion Information, Total Joint Packet, MRSA Information and Surgical Site Infection Prevention

## 2016-03-05 NOTE — Progress Notes (Signed)
PCP - Dr. Reginia Forts Cardiologist - denies  EKG - 03/03/16 CXR - 03/05/16  Echo/stress test/cardiac cath - denies  Patient denies chest pain and shortness of breath at PAT appointment.

## 2016-03-07 ENCOUNTER — Other Ambulatory Visit: Payer: Self-pay | Admitting: Family Medicine

## 2016-03-10 ENCOUNTER — Encounter: Payer: Self-pay | Admitting: Family Medicine

## 2016-03-10 ENCOUNTER — Telehealth: Payer: Self-pay

## 2016-03-10 NOTE — Telephone Encounter (Signed)
nicole with [facility] dr graves needs the clearance form for surgery on Monday   Best number [phone]

## 2016-03-10 NOTE — Telephone Encounter (Signed)
No. I performed her pre-op clearance last week with CPE.

## 2016-03-10 NOTE — Telephone Encounter (Signed)
Dr. Tamala Julian, does pt has to come in for clearamce?

## 2016-03-11 NOTE — Telephone Encounter (Signed)
I called nicole and advised her to call me back.

## 2016-03-12 NOTE — Telephone Encounter (Signed)
[place], [place] to [facility] to see what we need to send in.

## 2016-03-12 NOTE — Telephone Encounter (Signed)
Letter created and to be faxed by Jonelle Sidle, CMA.

## 2016-03-12 NOTE — Telephone Encounter (Signed)
Faxed letter to [facility].

## 2016-03-15 NOTE — H&P (Signed)
TOTAL KNEE ADMISSION H&P  Patient is being admitted for right total knee arthroplasty.  Subjective:  Chief Complaint:right knee pain.  HPI: Nicole Peters, 65 y.o. female, has a history of pain and functional disability in the right knee due to arthritis and has failed non-surgical conservative treatments for greater than 12 weeks to includeNSAID's and/or analgesics, corticosteriod injections, viscosupplementation injections, use of assistive devices and activity modification.  Onset of symptoms was gradual, starting 4 years ago with gradually worsening course since that time. The patient noted no past surgery on the right knee(s).  Patient currently rates pain in the right knee(s) at 9 out of 10 with activity. Patient has night pain, worsening of pain with activity and weight bearing, pain that interferes with activities of daily living, pain with passive range of motion and joint swelling.  Patient has evidence of subchondral cysts, joint subluxation and joint space narrowing by imaging studies. This patient has had failure of all reasonable conservative care. There is no active infection.  Patient Active Problem List   Diagnosis Date Noted  . Pure hypercholesterolemia 08/12/2015  . Primary osteoarthritis of both knees 08/12/2015  . Tobacco abuse 08/12/2015  . Hematuria 08/13/2014  . DJD (degenerative joint disease) 10/18/2012  . Depression with anxiety 10/18/2012  . Tobacco user 10/18/2012  . Obesity (BMI 30.0-34.9) 10/14/2012  . HTN (hypertension) 06/10/2012  . Lipid disorder 06/10/2012   Past Medical History  Diagnosis Date  . Hyperlipidemia   . Hypertension   . Arthritis   . Depression   . Anxiety   . History of bronchitis   . Stress incontinence     Past Surgical History  Procedure Laterality Date  . Knee surgery Bilateral     arthroscopic  . Appendectomy    . Cholecystectomy    . Tubal ligation    . Arthroscopic knee surgery      Both knees- Dr. Berenice Primas  .  Colonoscopy  12/22/2007    Diverticulosis. [facility]/[place]. Repeat in 10 years.  . Tonsillectomy      No prescriptions prior to admission   No Known Allergies  Social History  Substance Use Topics  . Smoking status: Current Every Day Smoker -- 0.25 packs/day    Types: Cigarettes  . Smokeless tobacco: Not on file     Comment: "smoke about 3 cigarettes a day"  . Alcohol Use: No    Family History  Problem Relation Age of Onset  . Cancer Father     stomach cancer with liver mets  . Heart disease Brother     mild heart attack     ROS ROS: I have reviewed the patient's review of systems thoroughly and there are no positive responses as relates to the HPI. Objective:  Physical Exam  Vital signs in last 24 hours:   Well-developed well-nourished patient in no acute distress. Alert and oriented x3 HEENT:within normal limits Cardiac: Regular rate and rhythm Pulmonary: Lungs clear to auscultation Abdomen: Soft and nontender.  Normal active bowel sounds  Musculoskeletal: (right knee: Painful range of motion.  Obvious severe valgus malalignment.  Pain to range of motion.  Neurovascularly intact distally.  Range of motion limited at 5-110. Labs: Recent Results (from the past 2160 hour(s))  CBC with Differential/Platelet     Status: Abnormal   Collection Time: 03/03/16  3:30 PM  Result Value Ref Range   WBC 7.4 3.8 - 10.8 K/uL   RBC 4.92 3.80 - 5.10 MIL/uL   Hemoglobin 15.4 11.7 - 15.5  g/dL   HCT 45.5 (H) 35.0 - 45.0 %   MCV 92.5 80.0 - 100.0 fL   MCH 31.3 27.0 - 33.0 pg   MCHC 33.8 32.0 - 36.0 g/dL   RDW 13.3 11.0 - 15.0 %   Platelets 211 140 - 400 K/uL   MPV 9.9 7.5 - 12.5 fL   Neutro Abs 4736 1500 - 7800 cells/uL   Lymphs Abs 1924 850 - 3900 cells/uL   Monocytes Absolute 518 200 - 950 cells/uL   Eosinophils Absolute 148 15 - 500 cells/uL   Basophils Absolute 74 0 - 200 cells/uL   Neutrophils Relative % 64 %   Lymphocytes Relative 26 %   Monocytes Relative 7 %    Eosinophils Relative 2 %   Basophils Relative 1 %   Smear Review Criteria for review not met     Comment: ** Please note change in unit of measure and reference range(s). **  Comprehensive metabolic panel     Status: None   Collection Time: 03/03/16  3:30 PM  Result Value Ref Range   Sodium 138 135 - 146 mmol/L   Potassium 4.3 3.5 - 5.3 mmol/L   Chloride 102 98 - 110 mmol/L   CO2 25 20 - 31 mmol/L   Glucose, Bld 89 65 - 99 mg/dL   BUN 14 7 - 25 mg/dL   Creat 0.76 0.50 - 0.99 mg/dL   Total Bilirubin 0.6 0.2 - 1.2 mg/dL   Alkaline Phosphatase 50 33 - 130 U/L   AST 18 10 - 35 U/L   ALT 12 6 - 29 U/L   Total Protein 6.5 6.1 - 8.1 g/dL   Albumin 4.0 3.6 - 5.1 g/dL   Calcium 9.2 8.6 - 10.4 mg/dL  Lipid panel     Status: None   Collection Time: 03/03/16  3:30 PM  Result Value Ref Range   Cholesterol 174 125 - 200 mg/dL   Triglycerides 103 <150 mg/dL   HDL 76 >=46 mg/dL   Total CHOL/HDL Ratio 2.3 <=5.0 Ratio   VLDL 21 <30 mg/dL   LDL Cholesterol 77 <130 mg/dL    Comment:   Total Cholesterol/HDL Ratio:CHD Risk                        Coronary Heart Disease Risk Table                                        Men       Women          1/2 Average Risk              3.4        3.3              Average Risk              5.0        4.4           2X Average Risk              9.6        7.1           3X Average Risk             23.4       11.0 Use the calculated Patient Ratio above and the CHD Risk table  to  determine the patient's CHD Risk.   POCT urinalysis dipstick     Status: Abnormal   Collection Time: 03/03/16  4:06 PM  Result Value Ref Range   Color, UA yellow yellow   Clarity, UA clear clear   Glucose, UA negative negative   Bilirubin, UA negative negative   Ketones, POC UA negative negative   Spec Grav, UA 1.020    Blood, UA small (A) negative   pH, UA 6.5    Protein Ur, POC negative negative   Urobilinogen, UA 0.2    Nitrite, UA Negative Negative   Leukocytes, UA Negative  Negative  Surgical pcr screen     Status: None   Collection Time: 03/05/16  1:41 PM  Result Value Ref Range   MRSA, PCR NEGATIVE NEGATIVE   Staphylococcus aureus NEGATIVE NEGATIVE    Comment:        The Xpert SA Assay (FDA approved for NASAL specimens in patients over 37 years of age), is one component of a comprehensive surveillance program.  Test performance has been validated by [facility] for patients greater than or equal to 93 year old. It is not intended to diagnose infection nor to guide or monitor treatment.   APTT     Status: None   Collection Time: 03/05/16  1:42 PM  Result Value Ref Range   aPTT 30 24 - 37 seconds  Protime-INR     Status: None   Collection Time: 03/05/16  1:42 PM  Result Value Ref Range   Prothrombin Time 14.0 11.6 - 15.2 seconds   INR 1.06 0.00 - 1.49  Type and screen Order type and screen if day of surgery is less than 15 days from draw of preadmission visit or order morning of surgery if day of surgery is greater than 6 days from preadmission visit.     Status: None   Collection Time: 03/05/16  1:43 PM  Result Value Ref Range   ABO/RH(D) A POS    Antibody Screen NEG    Sample Expiration 03/19/2016    Extend sample reason NO TRANSFUSIONS OR PREGNANCY IN THE PAST 3 MONTHS   ABO/Rh     Status: None   Collection Time: 03/05/16  1:43 PM  Result Value Ref Range   ABO/RH(D) A POS     Estimated body mass index is 32.18 kg/(m^2) as calculated from the following:   Height as of 08/12/15: _0  (1.575 m).   Weight as of 08/12/15: 79.833 kg (176 lb).   Imaging Review Plain radiographs demonstrate severe degenerative joint disease of the right knee(s). The overall alignment issignificant valgus. The bone quality appears to be poor for age and reported activity level.  Assessment/Plan:  End stage arthritis, right knee   The patient history, physical examination, clinical judgment of the provider and imaging studies are consistent with end stage  degenerative joint disease of the right knee(s) and total knee arthroplasty is deemed medically necessary. The treatment options including medical management, injection therapy arthroscopy and arthroplasty were discussed at length. The risks and benefits of total knee arthroplasty were presented and reviewed. The risks due to aseptic loosening, infection, stiffness, patella tracking problems, thromboembolic complications and other imponderables were discussed. The patient acknowledged the explanation, agreed to proceed with the plan and consent was signed. Patient is being admitted for inpatient treatment for surgery, pain control, PT, OT, prophylactic antibiotics, VTE prophylaxis, progressive ambulation and ADL's and discharge planning. The patient is planning to be discharged home with home health  services

## 2016-03-16 ENCOUNTER — Encounter (HOSPITAL_COMMUNITY)
Admission: RE | Disposition: A | Discharge status: Home Health | Payer: Self-pay | Source: Ambulatory Visit | Attending: Orthopedic Surgery

## 2016-03-16 ENCOUNTER — Inpatient Hospital Stay (HOSPITAL_COMMUNITY): Payer: Medicare Other | Admitting: Anesthesiology

## 2016-03-16 ENCOUNTER — Inpatient Hospital Stay (HOSPITAL_COMMUNITY)
Admission: RE | Admit: 2016-03-16 | Discharge: 2016-03-17 | DRG: 470 | Disposition: A | Discharge status: Home Health | Payer: Medicare Other | Source: Ambulatory Visit | Attending: Orthopedic Surgery | Admitting: Orthopedic Surgery

## 2016-03-16 ENCOUNTER — Encounter (HOSPITAL_COMMUNITY): Payer: Self-pay | Admitting: *Deleted

## 2016-03-16 DIAGNOSIS — I10 Essential (primary) hypertension: Secondary | ICD-10-CM | POA: Diagnosis present

## 2016-03-16 DIAGNOSIS — M1711 Unilateral primary osteoarthritis, right knee: Secondary | ICD-10-CM | POA: Diagnosis not present

## 2016-03-16 DIAGNOSIS — F17210 Nicotine dependence, cigarettes, uncomplicated: Secondary | ICD-10-CM | POA: Diagnosis present

## 2016-03-16 DIAGNOSIS — E785 Hyperlipidemia, unspecified: Secondary | ICD-10-CM | POA: Diagnosis present

## 2016-03-16 DIAGNOSIS — M179 Osteoarthritis of knee, unspecified: Secondary | ICD-10-CM | POA: Diagnosis not present

## 2016-03-16 DIAGNOSIS — M1712 Unilateral primary osteoarthritis, left knee: Secondary | ICD-10-CM | POA: Diagnosis not present

## 2016-03-16 DIAGNOSIS — F418 Other specified anxiety disorders: Secondary | ICD-10-CM | POA: Diagnosis not present

## 2016-03-16 DIAGNOSIS — M170 Bilateral primary osteoarthritis of knee: Secondary | ICD-10-CM | POA: Diagnosis present

## 2016-03-16 DIAGNOSIS — F329 Major depressive disorder, single episode, unspecified: Secondary | ICD-10-CM | POA: Diagnosis present

## 2016-03-16 DIAGNOSIS — M25561 Pain in right knee: Secondary | ICD-10-CM | POA: Diagnosis not present

## 2016-03-16 HISTORY — PX: TOTAL KNEE ARTHROPLASTY: SHX125

## 2016-03-16 HISTORY — PX: INJECTION KNEE: SHX2446

## 2016-03-16 SURGERY — ARTHROPLASTY, KNEE, TOTAL
Anesthesia: Monitor Anesthesia Care | Site: Knee | Laterality: Right

## 2016-03-16 MED ORDER — LIDOCAINE HCL (CARDIAC) 20 MG/ML IV SOLN
INTRAVENOUS | Status: DC | PRN
  Administered 2016-03-16: 20 mg via INTRAVENOUS

## 2016-03-16 MED ORDER — ALUM & MAG HYDROXIDE-SIMETH 200-200-20 MG/5ML PO SUSP: 30.0000 mL | ORAL | Status: DC | PRN

## 2016-03-16 MED ORDER — TRANEXAMIC ACID 1000 MG/10ML IV SOLN
1000.0000 mg | Freq: Once | INTRAVENOUS | Status: AC
  Administered 2016-03-16: 1000 mg via INTRAVENOUS
  Filled 2016-03-16: qty 10

## 2016-03-16 MED ORDER — METHYLPREDNISOLONE ACETATE 80 MG/ML IJ SUSP
INTRAMUSCULAR | Status: AC
  Filled 2016-03-16: qty 1

## 2016-03-16 MED ORDER — METHYLPREDNISOLONE ACETATE 80 MG/ML IJ SUSP
INTRAMUSCULAR | Status: DC | PRN
  Administered 2016-03-16: 80 mg via INTRA_ARTICULAR

## 2016-03-16 MED ORDER — BUPROPION HCL ER (XL) 150 MG PO TB24
300.0000 mg | ORAL_TABLET | Freq: Every day | ORAL | Status: DC
  Administered 2016-03-16 – 2016-03-17 (×2): 300 mg via ORAL
  Filled 2016-03-16 (×2): qty 2

## 2016-03-16 MED ORDER — HYDROMORPHONE HCL 1 MG/ML IJ SOLN: 0.2500 mg | INTRAMUSCULAR | Status: DC | PRN

## 2016-03-16 MED ORDER — MAGNESIUM CITRATE PO SOLN: 1.0000 | Freq: Once | ORAL | Status: DC | PRN

## 2016-03-16 MED ORDER — OXYCODONE-ACETAMINOPHEN 5-325 MG PO TABS: 1.0000 | ORAL_TABLET | ORAL | Status: DC | PRN

## 2016-03-16 MED ORDER — PROPOFOL 10 MG/ML IV BOLUS
INTRAVENOUS | Status: AC
  Filled 2016-03-16: qty 20

## 2016-03-16 MED ORDER — OXYCODONE HCL 5 MG PO TABS
5.0000 mg | ORAL_TABLET | ORAL | Status: DC | PRN
  Administered 2016-03-16 – 2016-03-17 (×6): 10 mg via ORAL
  Filled 2016-03-16 (×6): qty 2

## 2016-03-16 MED ORDER — LIDOCAINE HCL (CARDIAC) 20 MG/ML IV SOLN
INTRAVENOUS | Status: AC
  Filled 2016-03-16: qty 5

## 2016-03-16 MED ORDER — PHENYLEPHRINE HCL 10 MG/ML IJ SOLN
INTRAMUSCULAR | Status: DC | PRN
  Administered 2016-03-16 (×5): 40 ug via INTRAVENOUS

## 2016-03-16 MED ORDER — KETOROLAC TROMETHAMINE 15 MG/ML IJ SOLN
15.0000 mg | Freq: Three times a day (TID) | INTRAMUSCULAR | Status: DC
  Administered 2016-03-16 – 2016-03-17 (×2): 15 mg via INTRAVENOUS
  Filled 2016-03-16 (×2): qty 1

## 2016-03-16 MED ORDER — ACETAMINOPHEN 325 MG PO TABS: 650.0000 mg | ORAL_TABLET | Freq: Four times a day (QID) | ORAL | Status: DC | PRN

## 2016-03-16 MED ORDER — BISACODYL 5 MG PO TBEC: 5.0000 mg | DELAYED_RELEASE_TABLET | Freq: Every day | ORAL | Status: DC | PRN

## 2016-03-16 MED ORDER — EPHEDRINE SULFATE 50 MG/ML IJ SOLN
INTRAMUSCULAR | Status: AC
  Filled 2016-03-16: qty 1

## 2016-03-16 MED ORDER — ONDANSETRON HCL 4 MG/2ML IJ SOLN
4.0000 mg | Freq: Four times a day (QID) | INTRAMUSCULAR | Status: DC | PRN
Start: 2016-03-16 — End: 2016-03-16

## 2016-03-16 MED ORDER — PRAVASTATIN SODIUM 40 MG PO TABS
80.0000 mg | ORAL_TABLET | Freq: Every day | ORAL | Status: DC
  Administered 2016-03-16: 80 mg via ORAL
  Filled 2016-03-16: qty 2

## 2016-03-16 MED ORDER — CHLORHEXIDINE GLUCONATE 4 % EX LIQD: 60.0000 mL | Freq: Once | CUTANEOUS | Status: DC

## 2016-03-16 MED ORDER — SODIUM CHLORIDE 0.9 % IV SOLN
INTRAVENOUS | Status: DC
  Administered 2016-03-16 (×2): via INTRAVENOUS

## 2016-03-16 MED ORDER — SODIUM CHLORIDE 0.9 % IJ SOLN
INTRAMUSCULAR | Status: AC
  Filled 2016-03-16: qty 10

## 2016-03-16 MED ORDER — CEFAZOLIN SODIUM-DEXTROSE 2-4 GM/100ML-% IV SOLN
2.0000 g | Freq: Four times a day (QID) | INTRAVENOUS | Status: AC
  Administered 2016-03-16 (×2): 2 g via INTRAVENOUS
  Filled 2016-03-16 (×2): qty 100

## 2016-03-16 MED ORDER — TIZANIDINE HCL 2 MG PO TABS: 2.0000 mg | ORAL_TABLET | Freq: Three times a day (TID) | ORAL | Status: DC | PRN

## 2016-03-16 MED ORDER — ASPIRIN EC 325 MG PO TBEC: 325.0000 mg | DELAYED_RELEASE_TABLET | Freq: Two times a day (BID) | ORAL | Status: DC

## 2016-03-16 MED ORDER — BENAZEPRIL HCL 20 MG PO TABS
20.0000 mg | ORAL_TABLET | Freq: Every day | ORAL | Status: DC
  Administered 2016-03-16 – 2016-03-17 (×2): 20 mg via ORAL
  Filled 2016-03-16 (×2): qty 1

## 2016-03-16 MED ORDER — METHOCARBAMOL 1000 MG/10ML IJ SOLN
500.0000 mg | Freq: Four times a day (QID) | INTRAVENOUS | Status: DC | PRN
  Filled 2016-03-16: qty 5

## 2016-03-16 MED ORDER — ONDANSETRON HCL 4 MG/2ML IJ SOLN: 4.0000 mg | Freq: Four times a day (QID) | INTRAMUSCULAR | Status: DC | PRN

## 2016-03-16 MED ORDER — METHOCARBAMOL 500 MG PO TABS
500.0000 mg | ORAL_TABLET | Freq: Four times a day (QID) | ORAL | Status: DC | PRN
  Administered 2016-03-16 – 2016-03-17 (×3): 500 mg via ORAL
  Filled 2016-03-16 (×3): qty 1

## 2016-03-16 MED ORDER — BUPIVACAINE IN DEXTROSE 0.75-8.25 % IT SOLN
INTRATHECAL | Status: DC | PRN
  Administered 2016-03-16: 1.7 mL via INTRATHECAL

## 2016-03-16 MED ORDER — FENTANYL CITRATE (PF) 250 MCG/5ML IJ SOLN
INTRAMUSCULAR | Status: AC
  Filled 2016-03-16: qty 5

## 2016-03-16 MED ORDER — POLYETHYLENE GLYCOL 3350 17 G PO PACK: 17.0000 g | PACK | Freq: Every day | ORAL | Status: DC | PRN

## 2016-03-16 MED ORDER — DEXAMETHASONE SODIUM PHOSPHATE 10 MG/ML IJ SOLN
10.0000 mg | Freq: Two times a day (BID) | INTRAMUSCULAR | Status: AC
  Administered 2016-03-16 (×2): 10 mg via INTRAVENOUS
  Filled 2016-03-16 (×2): qty 1

## 2016-03-16 MED ORDER — DIPHENHYDRAMINE HCL 12.5 MG/5ML PO ELIX: 12.5000 mg | ORAL_SOLUTION | ORAL | Status: DC | PRN

## 2016-03-16 MED ORDER — SODIUM CHLORIDE 0.9 % IR SOLN
Status: DC | PRN
  Administered 2016-03-16: 1000 mL

## 2016-03-16 MED ORDER — MIDAZOLAM HCL 2 MG/2ML IJ SOLN
INTRAMUSCULAR | Status: AC
  Filled 2016-03-16: qty 2

## 2016-03-16 MED ORDER — TRANEXAMIC ACID 1000 MG/10ML IV SOLN
1000.0000 mg | INTRAVENOUS | Status: DC
  Administered 2016-03-16: 1000 mg via INTRAVENOUS

## 2016-03-16 MED ORDER — CEFAZOLIN SODIUM-DEXTROSE 2-4 GM/100ML-% IV SOLN
2.0000 g | INTRAVENOUS | Status: AC
  Administered 2016-03-16: 2 g via INTRAVENOUS

## 2016-03-16 MED ORDER — BUPIVACAINE HCL (PF) 0.5 % IJ SOLN
INTRAMUSCULAR | Status: AC
  Filled 2016-03-16: qty 30

## 2016-03-16 MED ORDER — BUPIVACAINE LIPOSOME 1.3 % IJ SUSP
INTRAMUSCULAR | Status: DC | PRN
  Administered 2016-03-16: 20 mL

## 2016-03-16 MED ORDER — SUCCINYLCHOLINE CHLORIDE 20 MG/ML IJ SOLN
INTRAMUSCULAR | Status: AC
  Filled 2016-03-16: qty 1

## 2016-03-16 MED ORDER — LACTATED RINGERS IV SOLN
INTRAVENOUS | Status: DC | PRN
  Administered 2016-03-16 (×2): via INTRAVENOUS

## 2016-03-16 MED ORDER — ONDANSETRON HCL 4 MG PO TABS: 4.0000 mg | ORAL_TABLET | Freq: Four times a day (QID) | ORAL | Status: DC | PRN

## 2016-03-16 MED ORDER — BUPIVACAINE HCL (PF) 0.5 % IJ SOLN
INTRAMUSCULAR | Status: DC | PRN
  Administered 2016-03-16: 20 mL

## 2016-03-16 MED ORDER — ALPRAZOLAM 0.25 MG PO TABS: 0.2500 mg | ORAL_TABLET | Freq: Two times a day (BID) | ORAL | Status: DC | PRN

## 2016-03-16 MED ORDER — OXYCODONE HCL 5 MG PO TABS: 5.0000 mg | ORAL_TABLET | Freq: Once | ORAL | Status: DC | PRN

## 2016-03-16 MED ORDER — MIDAZOLAM HCL 5 MG/5ML IJ SOLN
INTRAMUSCULAR | Status: DC | PRN
  Administered 2016-03-16: 2 mg via INTRAVENOUS

## 2016-03-16 MED ORDER — DOCUSATE SODIUM 100 MG PO CAPS
100.0000 mg | ORAL_CAPSULE | Freq: Two times a day (BID) | ORAL | Status: DC
  Administered 2016-03-16 – 2016-03-17 (×3): 100 mg via ORAL
  Filled 2016-03-16 (×3): qty 1

## 2016-03-16 MED ORDER — OXYCODONE HCL 5 MG/5ML PO SOLN: 5.0000 mg | Freq: Once | ORAL | Status: DC | PRN

## 2016-03-16 MED ORDER — PHENYLEPHRINE 40 MCG/ML (10ML) SYRINGE FOR IV PUSH (FOR BLOOD PRESSURE SUPPORT)
PREFILLED_SYRINGE | INTRAVENOUS | Status: AC
  Filled 2016-03-16: qty 10

## 2016-03-16 MED ORDER — 0.9 % SODIUM CHLORIDE (POUR BTL) OPTIME
TOPICAL | Status: DC | PRN
  Administered 2016-03-16: 1000 mL

## 2016-03-16 MED ORDER — ASPIRIN EC 325 MG PO TBEC
325.0000 mg | DELAYED_RELEASE_TABLET | Freq: Two times a day (BID) | ORAL | Status: DC
  Administered 2016-03-16 – 2016-03-17 (×2): 325 mg via ORAL
  Filled 2016-03-16 (×2): qty 1

## 2016-03-16 MED ORDER — ZOLPIDEM TARTRATE 5 MG PO TABS: 5.0000 mg | ORAL_TABLET | Freq: Every evening | ORAL | Status: DC | PRN

## 2016-03-16 MED ORDER — CEFAZOLIN SODIUM-DEXTROSE 2-4 GM/100ML-% IV SOLN
INTRAVENOUS | Status: AC
  Filled 2016-03-16: qty 100

## 2016-03-16 MED ORDER — PROPOFOL 500 MG/50ML IV EMUL
INTRAVENOUS | Status: DC | PRN
  Administered 2016-03-16: 50 ug/kg/min via INTRAVENOUS

## 2016-03-16 MED ORDER — HYDROMORPHONE HCL 1 MG/ML IJ SOLN
0.5000 mg | INTRAMUSCULAR | Status: DC | PRN
  Administered 2016-03-16: 1 mg via INTRAVENOUS
  Filled 2016-03-16: qty 1

## 2016-03-16 MED ORDER — ACETAMINOPHEN 650 MG RE SUPP: 650.0000 mg | Freq: Four times a day (QID) | RECTAL | Status: DC | PRN

## 2016-03-16 MED ORDER — TRANEXAMIC ACID 1000 MG/10ML IV SOLN
1000.0000 mg | INTRAVENOUS | Status: DC
  Filled 2016-03-16: qty 10

## 2016-03-16 MED ORDER — FENTANYL CITRATE (PF) 250 MCG/5ML IJ SOLN
INTRAMUSCULAR | Status: DC | PRN
  Administered 2016-03-16: 50 ug via INTRAVENOUS

## 2016-03-16 SURGICAL SUPPLY — 70 items
BANDAGE ESMARK 6X9 LF (GAUZE/BANDAGES/DRESSINGS) ×2 IMPLANT
BENZOIN TINCTURE PRP APPL 2/3 (GAUZE/BANDAGES/DRESSINGS) ×4 IMPLANT
BLADE SAGITTAL 25.0X1.19X90 (BLADE) ×3 IMPLANT
BLADE SAGITTAL 25.0X1.19X90MM (BLADE) ×1
BLADE SAW SAG 90X13X1.27 (BLADE) ×4 IMPLANT
BNDG COHESIVE 3X5 WHT NS (GAUZE/BANDAGES/DRESSINGS) ×4 IMPLANT
BNDG ESMARK 6X9 LF (GAUZE/BANDAGES/DRESSINGS) ×4
BOWL SMART MIX CTS (DISPOSABLE) ×4 IMPLANT
CAP KNEE TOTAL 3 SIGMA ×4 IMPLANT
CEMENT HV SMART SET (Cement) ×8 IMPLANT
CLOSURE STERI-STRIP 1/2X4 (GAUZE/BANDAGES/DRESSINGS) ×1
CLOSURE WOUND 1/2 X4 (GAUZE/BANDAGES/DRESSINGS) ×2
CLSR STERI-STRIP ANTIMIC 1/2X4 (GAUZE/BANDAGES/DRESSINGS) ×3 IMPLANT
COVER SURGICAL LIGHT HANDLE (MISCELLANEOUS) ×4 IMPLANT
CUFF TOURNIQUET SINGLE 34IN LL (TOURNIQUET CUFF) ×4 IMPLANT
CUFF TOURNIQUET SINGLE 44IN (TOURNIQUET CUFF) IMPLANT
DRAPE EXTREMITY T 121X128X90 (DRAPE) ×4 IMPLANT
DRAPE IMP U-DRAPE 54X76 (DRAPES) ×4 IMPLANT
DRAPE U-SHAPE 47X51 STRL (DRAPES) ×4 IMPLANT
DRSG AQUACEL AG ADV 3.5X10 (GAUZE/BANDAGES/DRESSINGS) IMPLANT
DRSG MEPILEX BORDER 4X12 (GAUZE/BANDAGES/DRESSINGS) ×4 IMPLANT
DRSG PAD ABDOMINAL 8X10 ST (GAUZE/BANDAGES/DRESSINGS) ×4 IMPLANT
DURAPREP 26ML APPLICATOR (WOUND CARE) ×4 IMPLANT
ELECT REM PT RETURN 9FT ADLT (ELECTROSURGICAL) ×4
ELECTRODE REM PT RTRN 9FT ADLT (ELECTROSURGICAL) ×2 IMPLANT
EVACUATOR 1/8 PVC DRAIN (DRAIN) ×4 IMPLANT
FACESHIELD WRAPAROUND (MASK) ×4 IMPLANT
GAUZE SPONGE 4X4 12PLY STRL (GAUZE/BANDAGES/DRESSINGS) ×4 IMPLANT
GLOVE BIOGEL PI IND STRL 8 (GLOVE) ×4 IMPLANT
GLOVE BIOGEL PI INDICATOR 8 (GLOVE) ×4
GLOVE ECLIPSE 7.5 STRL STRAW (GLOVE) ×8 IMPLANT
GOWN STRL REUS W/ TWL LRG LVL3 (GOWN DISPOSABLE) ×2 IMPLANT
GOWN STRL REUS W/ TWL XL LVL3 (GOWN DISPOSABLE) ×4 IMPLANT
GOWN STRL REUS W/TWL LRG LVL3 (GOWN DISPOSABLE) ×2
GOWN STRL REUS W/TWL XL LVL3 (GOWN DISPOSABLE) ×4
HANDPIECE INTERPULSE COAX TIP (DISPOSABLE) ×4
HOOD PEEL AWAY FACE SHEILD DIS (HOOD) ×12 IMPLANT
IMMOBILIZER KNEE 20 (SOFTGOODS) ×4 IMPLANT
IMMOBILIZER KNEE 20 THIGH 36 (SOFTGOODS) ×2 IMPLANT
IMMOBILIZER KNEE 22 UNIV (SOFTGOODS) ×4 IMPLANT
KIT BASIN OR (CUSTOM PROCEDURE TRAY) ×4 IMPLANT
KIT ROOM TURNOVER OR (KITS) ×4 IMPLANT
MANIFOLD NEPTUNE II (INSTRUMENTS) ×4 IMPLANT
NEEDLE 22X1 1/2 (OR ONLY) (NEEDLE) ×4 IMPLANT
NEEDLE SPNL 22GX3.5 QUINCKE BK (NEEDLE) ×4 IMPLANT
NS IRRIG 1000ML POUR BTL (IV SOLUTION) ×4 IMPLANT
PACK TOTAL JOINT (CUSTOM PROCEDURE TRAY) ×4 IMPLANT
PACK UNIVERSAL I (CUSTOM PROCEDURE TRAY) ×4 IMPLANT
PAD ARMBOARD 7.5X6 YLW CONV (MISCELLANEOUS) ×8 IMPLANT
PAD CAST 4YDX4 CTTN HI CHSV (CAST SUPPLIES) ×2 IMPLANT
PADDING CAST COTTON 4X4 STRL (CAST SUPPLIES) ×2
SET HNDPC FAN SPRY TIP SCT (DISPOSABLE) ×4 IMPLANT
STAPLER VISISTAT 35W (STAPLE) IMPLANT
STRIP CLOSURE SKIN 1/2X4 (GAUZE/BANDAGES/DRESSINGS) ×6 IMPLANT
SUCTION FRAZIER HANDLE 10FR (MISCELLANEOUS) ×2
SUCTION TUBE FRAZIER 10FR DISP (MISCELLANEOUS) ×2 IMPLANT
SUT MNCRL AB 3-0 PS2 18 (SUTURE) IMPLANT
SUT VIC AB 0 CTB1 27 (SUTURE) ×8 IMPLANT
SUT VIC AB 1 CT1 27 (SUTURE) ×4
SUT VIC AB 1 CT1 27XBRD ANBCTR (SUTURE) ×4 IMPLANT
SUT VIC AB 2-0 CTB1 (SUTURE) ×8 IMPLANT
SYR 50ML LL SCALE MARK (SYRINGE) ×4 IMPLANT
SYR CONTROL 10ML LL (SYRINGE) ×4 IMPLANT
TOWEL OR 17X24 6PK STRL BLUE (TOWEL DISPOSABLE) ×4 IMPLANT
TOWEL OR 17X26 10 PK STRL BLUE (TOWEL DISPOSABLE) ×4 IMPLANT
TRAY CATH 16FR W/PLASTIC CATH (SET/KITS/TRAYS/PACK) ×4 IMPLANT
TRAY FOLEY CATH 16FRSI W/METER (SET/KITS/TRAYS/PACK) IMPLANT
UPCHARGE REV TRAY MBT KNEE ×4 IMPLANT
WRAP KNEE MAXI GEL POST OP (GAUZE/BANDAGES/DRESSINGS) ×4 IMPLANT
YANKAUER SUCT BULB TIP NO VENT (SUCTIONS) ×4 IMPLANT

## 2016-03-16 NOTE — Evaluation (Signed)
Physical Therapy Evaluation Patient Details Name: Nicole Peters MRN: [id number] DOB: 1951/08/18 Today's Date: 03/16/2016   History of Present Illness  Pt reports fot right TKA and left knee cortisone injection. PMH: OA, HTN, DJD  Clinical Impression  Pt is s/p TKA resulting in the deficits listed below (see PT Problem List). Pt transferred bed to [facility] and short distance ambulation then to chair with min A and RW with KI donned. Pt will benefit from skilled PT to increase their independence and safety with mobility to allow discharge to the venue listed below.      Follow Up Recommendations Home health PT;Supervision for mobility/OOB    Equipment Recommendations  None recommended by PT    Recommendations for Other Services       Precautions / Restrictions Precautions Precautions: Knee Precaution Booklet Issued: No Precaution Comments: reviewed proper positioning Required Braces or Orthoses: Knee Immobilizer - Right Knee Immobilizer - Right: On when out of bed or walking Restrictions Weight Bearing Restrictions: Yes RLE Weight Bearing: Weight bearing as tolerated      Mobility  Bed Mobility Overal bed mobility: Needs Assistance Bed Mobility: Supine to Sit     Supine to sit: Mod assist     General bed mobility comments: coming out of left side of bed, pt needs mod A to right LE for mvmt  Transfers Overall transfer level: Needs assistance Equipment used: Rolling walker (2 wheeled) Transfers: Sit to/from [facility] Sit to Stand: Min assist Stand pivot transfers: Min assist       General transfer comment: vc's for proper hand placement, min A to steady and vc's for sequencing steps to [facility]  Ambulation/Gait Ambulation/Gait assistance: Min assist Ambulation Distance (Feet): 2 Feet Assistive device: Rolling walker (2 wheeled) Gait Pattern/deviations: Step-to pattern Gait velocity: decreased Gait velocity interpretation: Below normal speed for  age/gender General Gait Details: vc's for sequencing steps BSC to recliner. minimal wt put on RLE, vc's for WBAT  Stairs            Wheelchair Mobility    Modified Rankin (Stroke Patients Only)       Balance Overall balance assessment: Needs assistance Sitting-balance support: Feet supported;Single extremity supported Sitting balance-Leahy Scale: Fair     Standing balance support: Bilateral upper extremity supported Standing balance-Leahy Scale: Poor Standing balance comment: pt needed assistance for perineal care as could not balance without unilateral support on RW                             Pertinent Vitals/Pain Pain Assessment: Faces Faces Pain Scale: Hurts little more Pain Location: right knee Pain Descriptors / Indicators: Aching Pain Intervention(s): Limited activity within patient's tolerance;Monitored during session;Premedicated before session    Home Living Family/patient expects to be discharged to:: Private residence Living Arrangements: Spouse/significant other Available Help at Discharge: Family;Available 24 hours/day Type of Home: House Home Access: Stairs to enter Entrance Stairs-Rails: None Entrance Stairs-Number of Steps: 2 Home Layout: Laundry or work area in basement;Two level;Able to live on main level with bedroom/bathroom Home Equipment: Gilford Rile - 2 wheels Additional Comments: husband does laundry in basement    Prior Function Level of Independence: Independent               Hand Dominance        Extremity/Trunk Assessment   Upper Extremity Assessment: Overall WFL for tasks assessed           Lower Extremity Assessment:  RLE deficits/detail RLE Deficits / Details: trace quad, hip flex 2/5    Cervical / Trunk Assessment: Normal  Communication   Communication: No difficulties  Cognition Arousal/Alertness: Awake/alert Behavior During Therapy: WFL for tasks assessed/performed Overall Cognitive Status: Within  Functional Limits for tasks assessed                      General Comments      Exercises Total Joint Exercises Ankle Circles/Pumps: AROM;Both;10 reps;Supine Quad Sets: AROM;Right;10 reps;Supine Gluteal Sets: AROM;10 reps;Seated Goniometric ROM: 10-90      Assessment/Plan    PT Assessment Patient needs continued PT services  PT Diagnosis Difficulty walking;Abnormality of gait;Acute pain   PT Problem List Decreased strength;Decreased range of motion;Decreased activity tolerance;Decreased balance;Decreased mobility;Decreased knowledge of use of DME;Decreased knowledge of precautions;Pain  PT Treatment Interventions DME instruction;Gait training;Stair training;Functional mobility training;Therapeutic activities;Therapeutic exercise;Balance training;Neuromuscular re-education;Patient/family education   PT Goals (Current goals can be found in the Care Plan section) Acute Rehab PT Goals Patient Stated Goal: return home PT Goal Formulation: With patient Time For Goal Achievement: 03/23/16 Potential to Achieve Goals: Good    Frequency 7X/week   Barriers to discharge        Co-evaluation               End of Session Equipment Utilized During Treatment: Gait belt;Right knee immobilizer Activity Tolerance: Patient tolerated treatment well Patient left: in chair;with call bell/phone within reach;with family/visitor present Nurse Communication: Mobility status         Time: 1312-1350 PT Time Calculation (min) (ACUTE ONLY): 38 min   Charges:   PT Evaluation $PT Eval Moderate Complexity: 1 Procedure PT Treatments $Therapeutic Activity: 23-37 mins   PT G Codes:      Leighton Roach, PT  Acute Rehab Services  [place], [place] 03/16/2016, 2:05 PM

## 2016-03-16 NOTE — Progress Notes (Signed)
Utilization review complete. Jhanae Jaskowiak RN CCM Case Mgmt phone [phone] 

## 2016-03-16 NOTE — Brief Op Note (Signed)
03/16/2016  9:20 AM  PATIENT:  Nicole Peters  65 y.o. female  PRE-OPERATIVE DIAGNOSIS:  Bilateral Knee Osteoarthritis  POST-OPERATIVE DIAGNOSIS:  Bilateral Knee Osteoarthritis  PROCEDURE:  Procedure(s): RIGHT TOTAL KNEE ARTHROPLASTY ( LATERAL APPROACH) & LEFT KNEE CORTISONE INJECTION.  (Right) LEFT KNEE CORTISONE INJECTION.  (Left)  SURGEON:  Surgeon(s) and Role:    * Dorna Leitz, MD - Primary  PHYSICIAN ASSISTANT:   ASSISTANTS: bethune   ANESTHESIA:   general  EBL:  Total I/O In: 1000 [I.V.:1000] Out: -   BLOOD ADMINISTERED:none  DRAINS: (1 med) Hemovact drain(s) in the r knee with  Suction Open   LOCAL MEDICATIONS USED:  OTHER experel   SPECIMEN:  No Specimen  DISPOSITION OF SPECIMEN:  N/A  COUNTS:  YES  TOURNIQUET:   Total Tourniquet Time Documented: Thigh (Right) - 76 minutes Total: Thigh (Right) - 76 minutes   DICTATION: .Other Dictation: Dictation Number [id number]  PLAN OF CARE: Admit to inpatient   PATIENT DISPOSITION:  PACU - hemodynamically stable.   Delay start of Pharmacological VTE agent (>24hrs) due to surgical blood loss or risk of bleeding: no

## 2016-03-16 NOTE — Progress Notes (Signed)
Orthopedic Tech Progress Note Patient Details:  Nicole Peters 14-Sep-1951 [id number]  CPM Right Knee CPM Right Knee: On Right Knee Flexion (Degrees): 90 Right Knee Extension (Degrees): 0 Additional Comments: trapeze bar patient helper Viewed order from doctor's order list  Hildred Priest 03/16/2016, 10:28 AM

## 2016-03-16 NOTE — Anesthesia Postprocedure Evaluation (Signed)
Anesthesia Post Note  Patient: ALLEAN SCHWIEBERT  Procedure(s) Performed: Procedure(s) (LRB): RIGHT TOTAL KNEE ARTHROPLASTY ( LATERAL APPROACH) & LEFT KNEE CORTISONE INJECTION.  (Right) LEFT KNEE CORTISONE INJECTION.  (Left)  Patient location during evaluation: PACU Anesthesia Type: Spinal Level of consciousness: oriented and awake and alert Pain management: pain level controlled Vital Signs Assessment: post-procedure vital signs reviewed and stable Respiratory status: spontaneous breathing, respiratory function stable and patient connected to nasal cannula oxygen Cardiovascular status: blood pressure returned to baseline and stable Postop Assessment: no headache and no backache Anesthetic complications: no    Last Vitals:  Filed Vitals:   03/16/16 1115 03/16/16 1123  BP: 146/77   Pulse: 60   Temp:  36.1 C  Resp: 14     Last Pain: There were no vitals filed for this visit.  LLE Motor Response: Purposeful movement;Responds to commands (03/16/16 1128) LLE Sensation: Decreased (03/16/16 1128) RLE Motor Response: Responds to commands (03/16/16 1128) RLE Sensation: Decreased (03/16/16 1128) L Sensory Level: S1-Sole of foot, small toes (03/16/16 1128) R Sensory Level: S1-Sole of foot, small toes (03/16/16 1128)  [place]

## 2016-03-16 NOTE — Op Note (Addendum)
NAMEVINESSA, SCHLAEFER NO.:  [id number]  MEDICAL RECORD NO.:  [id number]  LOCATION:  [facility]                         FACILITY:  [facility]  PHYSICIAN:  Alta Corning, M.D.   DATE OF BIRTH:  06-24-1951  DATE OF PROCEDURE:  03/16/2016 DATE OF DISCHARGE:                              OPERATIVE REPORT   PREOPERATIVE DIAGNOSIS: 1. End-stage degenerative joint disease, right knee severe with severe valgus malalignment.2. Left knee djd severe with med subluxation   POSTOPERATIVE DIAGNOSIS:1.  End-stage degenerative joint disease, right knee severe with severe valgus malalignment.2.Left knee djd severe with med subluxation  PROCEDURES: 1. Right total knee replacement with a Sigma system, size 2.5 femur,     size 2 tibia, 15 mm bridging bearing.  The case was done from a     lateral approach. 2. Lateral retinacular release. 3.Injection left knee with 6cc marcain and 2cc depomedrol(80mg /cc)  SURGEON:  Alta Corning, M.D.  ASSISTANT:  Gary Fleet, P.A.  ANESTHESIA:  Spinal.  BRIEF HISTORY:  Ms. Dentler is a 65 year old female with a long history of severe bilateral degenerative joint disease of her knees.  She had been treated conservatively for a prolonged period of time with activity modification, injection therapy, and weight loss.  With failure of all conservative care, she was taken to the operating room for right total knee replacement.  She had severe valgus malalignment, and we had chosen to use a lateral approach for this case to allow [place] to do balancing with the surgical approach.  DESCRIPTION OF PROCEDURE:  The patient was taken to the operating room. After adequate anesthesia was obtained with spinal anesthetic, the patient was placed supine on the operating table.  The right leg was prepped and draped in usual sterile fashion.  Right leg was prepped and draped in usual sterile fashion.  Following this, leg was exsanguinated, blood pressure tourniquet was  inflated to 300 mmHg.  Following this, an incision was made following the valgus malalignment of her knee with almost a 45-degree bend.  Once this was done, a lateral parapatellar arthrotomy was undertaken giving access into the knee.  Once this was done carefully, we removed medial and lateral meniscus, retropatellar fat pad, synovium on the anterior aspect of the femur, and anterior and posterior cruciates.  Attention was then turned to the femur, where an intramedullary pilot hole was drilled followed by a guide and a 5-degree valgus inclination with 10 degrees of distal bone, we resected distal femur.  At this point, the sizing guide was used and sized to a 2.5, anterior-posterior cuts were made, chamfers and box.  Attention was then turned to the tibia, where an unbelievably unusual alignment was seen in the tibia.  She had this extreme rotational malalignment.  We basically get the tibia exposed and trying to cut perpendicular to the long axis, it really puts this in a sort of an extreme medially rotated position. Once this was completed, the tibia was exposed.  It was drilled and keeled down, it is a size 2, which really gives good bone coverage and it really is in a medially rotated position, but we just have to accept this for good  bone coverage, that is what we need when the bone is so soft, I think that is really what we need to use.  I chose to use an MBT revision stem on her because of the softness of her bone as seen on her preoperative imaging.  At this point, the tibia was drilled and keeled, and sized to a 2, the 2 was put in place.  I used a 15 mm bridging bearing, and this actually seems to give excellent balance and stability.  At this point, we got to the patella, we cut it down to a level of 14 mm, and a paddle was chosen, and the lugs were drilled for the patella.  At this point, the knee was copiously and thoroughly lavaged, pulsatile lavage, irrigation, and  suctioned dry.  The final components were cemented into place, a size 2 MBT revision style tray, a size 2-1/2 femur, a 15 mm bridging bearing trial was placed and a 35 all poly patella was placed and held with a clamp.  All excess bone cement was removed and osteophytes were removed from the lateral tibia as well as around the patella.  Once the cement was completely hardened, the medium Hemovac drain was placed and the lateral peripatellar arthrotomy was closed from the quad tendon down to the superior portion of the patella and then from the patellar tendon up to the inferior pole of the patella leaving the lateral retinacular release performed.  At this point, the sutures were tied and the knee put through a range of motion. Excellent stability was achieved.  Prior to closure, the tourniquet was let down.  The trial component was removed, and the final 15 mm bridging bearing component was placed.  At this point, the knee was again irrigated, suctioned dry.  20 mL of Exparel with 20 mL of Marcaine had been instilled throughout the synovial reflections and in the fatty tissue for postoperative pain control.  The arthrotomy was closed as outlined below.  The skin was then closed with 0, 2-0 Vicryl, and 3-0 Monocryl subcuticular.  Benzoin and Steri-Strips were applied.  Sterile compressive dressing was applied, and the patient was taken to the recovery room, she was noted to be in satisfactory condition.  Estimated blood loss for the procedure is minimal.  At this point we injected the left knee with 6cc marcain and 2cc (80mg /cc) depomedrol under sterile technique.     Alta Corning, M.D.     Corliss Skains  D:  03/16/2016  T:  03/16/2016  Job:  [id number]

## 2016-03-16 NOTE — Discharge Instructions (Signed)

## 2016-03-16 NOTE — Op Note (Signed)
NAMEZERIAH, LAZOR NO.:  [id number]  MEDICAL RECORD NO.:  [id number]  LOCATION:  [facility]                         FACILITY:  [facility]  PHYSICIAN:  Alta Corning, M.D.   DATE OF BIRTH:  13-Jan-1951  DATE OF PROCEDURE:  03/16/2016 DATE OF DISCHARGE:                              OPERATIVE REPORT   The patient is a 65 year old female in the Orthopedic Surgery Service.  DICTATION ENDS HERE     Alta Corning, M.D.     Corliss Skains  D:  03/16/2016  T:  03/16/2016  Job:  [id number]

## 2016-03-16 NOTE — Anesthesia Procedure Notes (Signed)
Spinal Patient location during procedure: OR Start time: 03/16/2016 7:18 AM End time: 03/16/2016 7:28 AM Staffing Anesthesiologist: HODIERNE, ADAM Performed by: anesthesiologist  Preanesthetic Checklist Completed: patient identified, site marked, surgical consent, pre-op evaluation, timeout performed, IV checked, risks and benefits discussed and monitors and equipment checked Spinal Block Patient position: sitting Prep: Betadine Patient monitoring: heart rate, continuous pulse ox, cardiac monitor and blood pressure Approach: midline Location: L3-4 Injection technique: single-shot Needle Needle type: Pencan  Needle gauge: 24 G Needle length: 9 cm Assessment Sensory level: T10 Additional Notes Pt tolerated the procedure well.

## 2016-03-16 NOTE — Transfer of Care (Signed)
Immediate Anesthesia Transfer of Care Note  Patient: Nicole Peters  Procedure(s) Performed: Procedure(s): RIGHT TOTAL KNEE ARTHROPLASTY ( LATERAL APPROACH) & LEFT KNEE CORTISONE INJECTION.  (Right) LEFT KNEE CORTISONE INJECTION.  (Left)  Patient Location: PACU  Anesthesia Type:Spinal  Level of Consciousness: awake, alert  and oriented  Airway & Oxygen Therapy: Patient Spontanous Breathing  Post-op Assessment: Report given to RN and Post -op Vital signs reviewed and stable  Post vital signs: Reviewed and stable  Last Vitals:  Filed Vitals:   03/16/16 0558  BP: 174/77  Pulse: 80  Temp: 36.5 C  Resp: 18    Complications: No apparent anesthesia complications

## 2016-03-16 NOTE — Anesthesia Preprocedure Evaluation (Addendum)
Anesthesia Evaluation  Patient identified by MRN, date of birth, ID band Patient awake    Reviewed: Allergy & Precautions, NPO status , Patient's Chart, lab work & pertinent test results  Airway Mallampati: II   Neck ROM: full    Dental  (+) Edentulous Upper, Dental Advisory Given   Pulmonary Current Smoker,    breath sounds clear to auscultation       Cardiovascular hypertension,  Rhythm:regular Rate:Normal     Neuro/Psych PSYCHIATRIC DISORDERS Anxiety Depression    GI/Hepatic   Endo/Other  obese  Renal/GU      Musculoskeletal  (+) Arthritis ,   Abdominal   Peds  Hematology   Anesthesia Other Findings   Reproductive/Obstetrics                            Anesthesia Physical Anesthesia Plan  ASA: II  Anesthesia Plan: MAC and Spinal   Post-op Pain Management:    Induction: Intravenous  Airway Management Planned: Simple Face Mask  Additional Equipment:   Intra-op Plan:   Post-operative Plan:   Informed Consent: I have reviewed the patients History and Physical, chart, labs and discussed the procedure including the risks, benefits and alternatives for the proposed anesthesia with the patient or authorized representative who has indicated his/her understanding and acceptance.     Plan Discussed with: CRNA, Anesthesiologist and Surgeon  Anesthesia Plan Comments:         Anesthesia Quick Evaluation

## 2016-03-17 ENCOUNTER — Encounter (HOSPITAL_COMMUNITY): Payer: Self-pay | Admitting: Orthopedic Surgery

## 2016-03-17 LAB — BASIC METABOLIC PANEL
ANION GAP: 11 (ref 5–15)
BUN: 8 mg/dL (ref 6–20)
CALCIUM: 8.6 mg/dL — AB (ref 8.9–10.3)
CO2: 21 mmol/L — AB (ref 22–32)
CREATININE: 0.84 mg/dL (ref 0.44–1.00)
Chloride: 98 mmol/L — ABNORMAL LOW (ref 101–111)
GFR calc Af Amer: >60 mL/min (ref >60)
GFR calc non Af Amer: >60 mL/min (ref >60)
GLUCOSE: 157 mg/dL — AB (ref 65–99)
POTASSIUM: 4.2 mmol/L (ref 3.5–5.1)
SODIUM: 130 mmol/L — AB (ref 135–145)

## 2016-03-17 LAB — CBC
HCT: 36.6 % (ref 36.0–46.0)
Hemoglobin: 12.1 g/dL (ref 12.0–15.0)
MCH: 30.1 pg (ref 26.0–34.0)
MCHC: 33.1 g/dL (ref 30.0–36.0)
MCV: 91 fL (ref 78.0–100.0)
PLATELETS: 173 10*3/uL (ref 150–400)
RBC: 4.02 MIL/uL (ref 3.87–5.11)
RDW: 13.2 % (ref 11.5–15.5)
WBC: 13.6 10*3/uL — AB (ref 4.0–10.5)

## 2016-03-17 NOTE — Care Management Note (Addendum)
Case Management Note  Patient Details  Name: Nicole Peters MRN: [id number] Date of Birth: [birth year]  Subjective/Objective:   65 yr old female s/p right total knee arthroplasty., Left knee with injection of depomedrol and marcain.                 Action/Plan: Patient was preoperatively setup with [facility], no changes. Has rolling walker, needs 3in1. CM called [place] DME liaison to order. Patient will have family support at discharge.    Expected Discharge Date:    03/17/16              Expected Discharge Plan:  [place]  In-House Referral:  NA  Discharge planning Services  CM Consult  Post Acute Care Choice:  Durable Medical Equipment, Home Health Choice offered to:  Patient  DME Arranged:  3-N-1 DME Agency:  [place]:  PT [facility] Agency:  [place]  Status of Service:  Completed, signed off  Medicare Important Message Given:    Date Medicare IM Given:    Medicare IM give by:    Date Additional Medicare IM Given:    Additional Medicare Important Message give by:     If discussed at [place] of Stay Meetings, dates discussed:    Additional Comments: Patient doesn't want 3in1, and her walker doesn't have wheels. Case manager ordered walker and cancelled 3in1 order. CPM will be delivered to patient's home by [employer].  Ninfa Meeker, RN 03/17/2016, 9:14 AM

## 2016-03-17 NOTE — Progress Notes (Signed)
Physical Therapy Treatment Patient Details Name: Nicole Peters MRN: [id number] DOB: March 04, 1951 Today's Date: 03/17/2016    History of Present Illness Pt reports fot right TKA and left knee cortisone injection. PMH: OA, HTN, DJD    PT Comments    Pt performed repeated trial to review sequencing for safe entry into home.  Pt reviewed HEP for understanding to perform at home.  Pt ready for d/c this pm RN informed.    Follow Up Recommendations  Home health PT;Supervision for mobility/OOB     Equipment Recommendations  None recommended by PT    Recommendations for Other Services       Precautions / Restrictions Precautions Precautions: Knee Precaution Comments: reviewed proper positioning and HEP.   Required Braces or Orthoses: Knee Immobilizer - Right Knee Immobilizer - Right: On when out of bed or walking Restrictions Weight Bearing Restrictions: Yes RLE Weight Bearing: Weight bearing as tolerated    Mobility  Bed Mobility               General bed mobility comments: Pt recieved in recliner chair.    Transfers Overall transfer level: Needs assistance Equipment used: Rolling walker (2 wheeled) Transfers: Sit to/from Stand Sit to Stand: Supervision Stand pivot transfers: Supervision       General transfer comment: Remains to requir VCs for hand placement to ensure safety.  Poor eccentric loading remains.    Ambulation/Gait Ambulation/Gait assistance: Min guard Ambulation Distance (Feet): 200 Feet Assistive device: Rolling walker (2 wheeled) Gait Pattern/deviations: Antalgic;Decreased dorsiflexion - left;Step-through pattern Gait velocity: decreased   General Gait Details: VCs for R knee ext in stance phase, right heel strike and forward gaze during ambulation.     Stairs Stairs: Yes Stairs assistance: Min assist Stair Management: No rails;Backwards;Forwards;With walker   General stair comments: Pt performed stair negotiation backwards with RW and cues  for sequencing to ascend.  Pt performed forward negotiation to descend.  Post stair training, pt required min VCs to recall technique and sequencing.  Pt remains to require cueing to recall sequencing and technique.    Wheelchair Mobility    Modified Rankin (Stroke Patients Only)       Balance Overall balance assessment: Needs assistance   Sitting balance-Leahy Scale: Good       Standing balance-Leahy Scale: Fair                      Cognition Arousal/Alertness: Awake/alert Behavior During Therapy: WFL for tasks assessed/performed Overall Cognitive Status: Within Functional Limits for tasks assessed                      Exercises Total Joint Exercises Ankle Circles/Pumps: AROM;Both;10 reps;Supine Quad Sets: AROM;Right;10 reps;Supine Gluteal Sets: AROM;10 reps;Seated Towel Squeeze: AROM;Both;10 reps;Supine Short Arc Quad: Right;10 reps;Supine;AAROM Heel Slides: AROM;Right;10 reps;Supine Hip ABduction/ADduction: AROM;Right;10 reps;Supine Straight Leg Raises: AROM;Right;10 reps;Supine Long Arc Quad: AROM;Right;10 reps;Seated Knee Flexion: AROM;Right;5 reps;Seated;AAROM    General Comments        Pertinent Vitals/Pain Pain Assessment: 0-10 Pain Score: 6  Pain Location: R knee Pain Descriptors / Indicators: Aching Pain Intervention(s): Monitored during session;Repositioned;Premedicated before session    Home Living                      Prior Function            PT Goals (current goals can now be found in the care plan section) Acute Rehab PT Goals Patient Stated Goal:  return home Potential to Achieve Goals: Good Progress towards PT goals: Progressing toward goals    Frequency  7X/week    PT Plan      Co-evaluation             End of Session Equipment Utilized During Treatment: Gait belt;Right knee immobilizer Activity Tolerance: Patient tolerated treatment well Patient left: in chair;with call bell/phone within  reach;with family/visitor present     Time: [id number] PT Time Calculation (min) (ACUTE ONLY): 25 min  Charges:  $Gait Training: 8-22 mins $Therapeutic Exercise: 8-22 mins                    G Codes:      Cristela Blue 2016-04-13, 4:06 PM Governor Rook[place], PTA pager [phone]

## 2016-03-17 NOTE — Progress Notes (Signed)
Physical Therapy Treatment Patient Details Name: Nicole Peters MRN: [id number] DOB: 07/14/1951 Today's Date: 03/17/2016    History of Present Illness Pt reports fot right TKA and left knee cortisone injection. PMH: OA, HTN, DJD    PT Comments    Pt progressing well towards goals.  Pt will require pm visit before d/c home to review HEP and stair training.    Follow Up Recommendations  Home health PT;Supervision for mobility/OOB     Equipment Recommendations  None recommended by PT    Recommendations for Other Services       Precautions / Restrictions Precautions Precautions: Knee Precaution Booklet Issued: No Precaution Comments: reviewed proper positioning Required Braces or Orthoses: Knee Immobilizer - Right Knee Immobilizer - Right: On when out of bed or walking Restrictions Weight Bearing Restrictions: Yes RLE Weight Bearing: Weight bearing as tolerated    Mobility  Bed Mobility Overal bed mobility: Needs Assistance Bed Mobility: Supine to Sit     Supine to sit: Min assist     General bed mobility comments: To lift RLE out of cpm.    Transfers Overall transfer level: Needs assistance Equipment used: Rolling walker (2 wheeled) Transfers: Sit to/from Stand Sit to Stand: Min guard Stand pivot transfers: Min guard       General transfer comment: VCs for hand placement.  Pt required cues to control descent to seated surface.  Poor eccentric loading noted.    Ambulation/Gait Ambulation/Gait assistance: Min guard Ambulation Distance (Feet): 200 Feet Assistive device: Rolling walker (2 wheeled) Gait Pattern/deviations: Antalgic;Decreased dorsiflexion - right;Step-through pattern Gait velocity: decreased   General Gait Details: VCs for R knee ext in stance phase, right heel strike and forward gaze during ambulation.     Stairs Stairs: Yes Stairs assistance: Min assist Stair Management: No rails;Backwards;Forwards Number of Stairs: 2 General stair  comments: Pt performed stair negotiation backwards with RW and cues for sequencing to ascend.  Pt performed forward negotiation to descend.  Post stair training, pt required min VCs to recall technique and sequencing.    Wheelchair Mobility    Modified Rankin (Stroke Patients Only)       Balance Overall balance assessment: Needs assistance Sitting-balance support: Feet supported;Single extremity supported Sitting balance-Leahy Scale: Good       Standing balance-Leahy Scale: Fair                      Cognition Arousal/Alertness: Awake/alert Behavior During Therapy: WFL for tasks assessed/performed Overall Cognitive Status: Within Functional Limits for tasks assessed                      Exercises Total Joint Exercises Ankle Circles/Pumps: AROM;Both;10 reps;Supine Quad Sets: AROM;Right;10 reps;Supine Towel Squeeze: AROM;Both;10 reps;Supine Short Arc Quad: Right;10 reps;Supine;AAROM Heel Slides: AROM;Right;10 reps;Supine Hip ABduction/ADduction: AROM;Right;10 reps;Supine Straight Leg Raises: AROM;Right;10 reps;Supine Long Arc Quad: AROM;Right;10 reps;Seated Knee Flexion: AROM;Right;5 reps;Seated;AAROM (2x5 reps. )    General Comments        Pertinent Vitals/Pain Pain Assessment: 0-10 Pain Score: 2  Pain Location: R knee Pain Descriptors / Indicators: Aching Pain Intervention(s): Monitored during session;Repositioned;Premedicated before session (Pt denied cold pack.  )    Home Living                      Prior Function            PT Goals (current goals can now be found in the care plan section) Acute Rehab PT  Goals Patient Stated Goal: return home Potential to Achieve Goals: Good Progress towards PT goals: Progressing toward goals    Frequency  7X/week    PT Plan      Co-evaluation             End of Session           Time: [id number] PT Time Calculation (min) (ACUTE ONLY): 32 min  Charges:  $Gait Training: 8-22  mins $Therapeutic Exercise: 8-22 mins                    G Codes:      Cristela Blue 03-31-16, 10:19 AM  Governor Rooks, PTA pager [phone]

## 2016-03-17 NOTE — Progress Notes (Signed)
Subjective: 1 Day Post-Op Procedure(s) (LRB): RIGHT TOTAL KNEE ARTHROPLASTY ( LATERAL APPROACH) & LEFT KNEE CORTISONE INJECTION.  (Right) LEFT KNEE CORTISONE INJECTION.  (Left) Patient reports pain as mild.  Taking by mouth and voiding okay. Has been out of bed to the bathroom.  Objective: Vital signs in last 24 hours: Temp:  [97 F (36.1 C)-98.8 F (37.1 C)] 98.5 F (36.9 C) (04/25 0500) Pulse Rate:  [58-97] 97 (04/25 0500) Resp:  [14-20] 18 (04/25 0500) BP: (84-168)/(67-80) 167/80 mmHg (04/25 0500) SpO2:  [91 %-100 %] 91 % (04/25 0500)  Intake/Output from previous day: 04/24 0701 - 04/25 0700 In: 2650 [P.O.:480; I.V.:2020] Out: 1275 [Urine:550; Drains:575; Blood:150] Intake/Output this shift:     Recent Labs  03/17/16 0245  HGB 12.1    Recent Labs  03/17/16 0245  WBC 13.6*  RBC 4.02  HCT 36.6  PLT 173    Recent Labs  03/17/16 0245  NA 130*  K 4.2  CL 98*  CO2 21*  BUN 8  CREATININE 0.84  GLUCOSE 157*  CALCIUM 8.6*   No results for input(s): LABPT, INR in the last 72 hours. Right knee exam: Hemovac drain intact. Neurovascular intact Sensation intact distally Intact pulses distally Dorsiflexion/Plantar flexion intact Incision: dressing C/D/I Compartment soft  Assessment/Plan: 1 Day Post-Op Procedure(s) (LRB): RIGHT TOTAL KNEE ARTHROPLASTY ( LATERAL APPROACH) & LEFT KNEE CORTISONE INJECTION.  (Right) LEFT KNEE CORTISONE INJECTION.  (Left) Plan: Hemovac drain pulled. Aspirin 325 mg twice daily 1 month for DVT prophylaxis. Up with therapy Discharge home with home health Follow-up with Dr. Berenice Primas in 2 weeks.  Brady G 03/17/2016, 8:21 AM

## 2016-03-17 NOTE — Progress Notes (Signed)
Pt ready for d/c per MD. Cleared by PT/OT, equipment needed has been delivered. Discharge teaching and prescriptions reviewed with pt and family, all questions answered. Belongings gathered and sent with family. Assisted to car by volunteer.   [place], Jerry Caras

## 2016-03-17 NOTE — Discharge Summary (Signed)
Patient ID: Nicole Peters MRN: [id number] DOB/AGE: 08/03/1951 65 y.o.  Admit date: 03/16/2016 Discharge date: 03/17/2016  Admission Diagnoses:  Principal Problem:   Primary osteoarthritis of right knee Primary osteoarthritis of left knee  Discharge Diagnoses:  Same  Past Medical History  Diagnosis Date  . Hyperlipidemia   . Hypertension   . Arthritis   . Depression   . Anxiety   . History of bronchitis   . Stress incontinence     Surgeries: Procedure(s): RIGHT TOTAL KNEE ARTHROPLASTY ( LATERAL APPROACH) & LEFT KNEE CORTISONE INJECTION.  LEFT KNEE CORTISONE INJECTION.  on 03/16/2016   Consultants:    Discharged Condition: Improved  Hospital Course: Nicole Peters is an 65 y.o. female who was admitted 03/16/2016 for operative treatment ofPrimary osteoarthritis of right knee. Patient has severe unremitting pain that affects sleep, daily activities, and work/hobbies. After pre-op clearance the patient was taken to the operating room on 03/16/2016 and underwent  Procedure(s): RIGHT TOTAL KNEE ARTHROPLASTY ( LATERAL APPROACH) & LEFT KNEE CORTISONE INJECTION.  LEFT KNEE CORTISONE INJECTION. Marland Kitchen    Patient was given perioperative antibiotics: Anti-infectives    Start     Dose/Rate Route Frequency Ordered Stop   03/16/16 1300  ceFAZolin (ANCEF) IVPB 2g/100 mL premix     2 g 200 mL/hr over 30 Minutes Intravenous Every 6 hours 03/16/16 1147 03/16/16 2017   03/16/16 0523  ceFAZolin (ANCEF) 2-4 GM/100ML-% IVPB    Comments:  Forte, Lindsi   : cabinet override      03/16/16 0523 03/16/16 1729   03/16/16 0518  ceFAZolin (ANCEF) IVPB 2g/100 mL premix     2 g 200 mL/hr over 30 Minutes Intravenous On call to O.R. 03/16/16 0518 03/16/16 0730       Patient was given sequential compression devices, early ambulation, and chemoprophylaxis to prevent DVT.On postoperative day #1 the patient was doing well. Her drain was pulled without complications.  Patient benefited maximally from hospital  stay and there were no complications.    Recent vital signs: Patient Vitals for the past 24 hrs:  BP Temp Temp src Pulse Resp SpO2  03/17/16 0500 (!) 167/80 mmHg 98.5 F (36.9 C) Oral 97 18 91 %  03/16/16 2300 (!) 168/78 mmHg 98.1 F (36.7 C) Oral 66 18 97 %  03/16/16 1933 (!) 164/80 mmHg 98.8 F (37.1 C) Oral 64 18 95 %  03/16/16 1142 (!) 159/80 mmHg 97 F (36.1 C) - 64 16 100 %  03/16/16 1123 - 97 F (36.1 C) - - - -  03/16/16 1115 (!) 146/77 mmHg - - 60 14 96 %  03/16/16 1100 (!) 146/80 mmHg - - 64 16 97 %  03/16/16 1045 (!) 142/72 mmHg - - 62 15 94 %  03/16/16 1030 140/74 mmHg - - (!) 58 14 98 %  03/16/16 1015 122/67 mmHg - - 64 19 97 %  03/16/16 1000 (!) 84/75 mmHg 97.4 F (36.3 C) - 69 20 98 %     Recent laboratory studies:  Recent Labs  03/17/16 0245  WBC 13.6*  HGB 12.1  HCT 36.6  PLT 173  NA 130*  K 4.2  CL 98*  CO2 21*  BUN 8  CREATININE 0.84  GLUCOSE 157*  CALCIUM 8.6*     Discharge Medications:     Medication List    STOP taking these medications        aspirin 81 MG tablet  Replaced by:  aspirin EC 325 MG tablet  TAKE these medications        acetaminophen 500 MG tablet  Commonly known as:  TYLENOL  Take 1,000 mg by mouth every 8 (eight) hours as needed for mild pain or moderate pain.     ALPRAZolam 0.25 MG tablet  Commonly known as:  XANAX  Take 1- 1 1/2 tablets by mouth daily.     aspirin EC 325 MG tablet  Take 1 tablet (325 mg total) by mouth 2 (two) times daily after a meal. Take x 1 month post op to decrease risk of blood clots.     benazepril 20 MG tablet  Commonly known as:  LOTENSIN  Take 1 tablet (20 mg total) by mouth daily.     buPROPion 300 MG 24 hr tablet  Commonly known as:  WELLBUTRIN XL  Take 1 tablet (300 mg total) by mouth daily.     cetirizine 10 MG tablet  Commonly known as:  ZYRTEC  Take 10 mg by mouth daily as needed for allergies.     multivitamin tablet  Take 1 tablet by mouth daily.      oxyCODONE-acetaminophen 5-325 MG tablet  Commonly known as:  PERCOCET/ROXICET  Take 1-2 tablets by mouth every 4 (four) hours as needed for severe pain.     pravastatin 80 MG tablet  Commonly known as:  PRAVACHOL  Take 1 tablet (80 mg total) by mouth at bedtime.     tiZANidine 2 MG tablet  Commonly known as:  ZANAFLEX  Take 1 tablet (2 mg total) by mouth every 8 (eight) hours as needed for muscle spasms.     Vitamin D 2000 units tablet  Take 2,000 Units by mouth daily.        Diagnostic Studies: Dg Chest 2 View  03/05/2016  CLINICAL DATA:  Proper evaluation for RIGHT total knee arthroplasty, history hypertension, smoking, hyperlipidemia EXAM: CHEST  2 VIEW COMPARISON:  None FINDINGS: Normal heart size, mediastinal contours, and pulmonary vascularity. Lungs clear. No pleural effusion or pneumothorax. Multilevel endplate spur formation thoracic spine. LEFT glenohumeral degenerative changes. IMPRESSION: No acute abnormalities. Electronically Signed   By: Lavonia Dana M.D.   On: 03/05/2016 15:35    Disposition: 01-Home or Self Care      Discharge Instructions    CPM    Complete by:  As directed   Continuous passive motion machine (CPM):      Use the CPM from 0 to 60 for 8 hours per day.      You may increase by 5-10 per day.  You may break it up into 2 or 3 sessions per day.      Use CPM for 1-2 weeks or until you are told to stop.     Call MD / Call 911    Complete by:  As directed   If you experience chest pain or shortness of breath, CALL 911 and be transported to the hospital emergency room.  If you develope a fever above 101 F, pus (white drainage) or increased drainage or redness at the wound, or calf pain, call your surgeon's office.     Constipation Prevention    Complete by:  As directed   Drink plenty of fluids.  Prune juice may be helpful.  You may use a stool softener, such as Colace (over the counter) 100 mg twice a day.  Use MiraLax (over the counter) for constipation  as needed.     Diet general    Complete by:  As directed  Do not put a pillow under the knee. Place it under the heel.    Complete by:  As directed      Increase activity slowly as tolerated    Complete by:  As directed      Weight bearing as tolerated    Complete by:  As directed   Laterality:  left  Extremity:  Lower           Follow-up Information    Follow up with GRAVES,JOHN L, MD. Schedule an appointment as soon as possible for a visit in 2 weeks.   Specialty:  Orthopedic Surgery   Contact information:   [place] [address] [phone]        Signed: Erlene Senters 03/17/2016, 8:25 AM

## 2016-03-17 NOTE — Progress Notes (Signed)
Pt states 3n1 is not needed for home. Notified [facility] DME rep. Jonnie Finner RN CCM Case Mgmt phone [phone]

## 2016-03-18 DIAGNOSIS — F329 Major depressive disorder, single episode, unspecified: Secondary | ICD-10-CM | POA: Diagnosis not present

## 2016-03-18 DIAGNOSIS — E785 Hyperlipidemia, unspecified: Secondary | ICD-10-CM | POA: Diagnosis not present

## 2016-03-18 DIAGNOSIS — Z471 Aftercare following joint replacement surgery: Secondary | ICD-10-CM | POA: Diagnosis not present

## 2016-03-18 DIAGNOSIS — M150 Primary generalized (osteo)arthritis: Secondary | ICD-10-CM | POA: Diagnosis not present

## 2016-03-18 DIAGNOSIS — Z96651 Presence of right artificial knee joint: Secondary | ICD-10-CM | POA: Diagnosis not present

## 2016-03-18 DIAGNOSIS — F419 Anxiety disorder, unspecified: Secondary | ICD-10-CM | POA: Diagnosis not present

## 2016-03-18 DIAGNOSIS — Z720 Tobacco use: Secondary | ICD-10-CM | POA: Diagnosis not present

## 2016-03-18 DIAGNOSIS — E669 Obesity, unspecified: Secondary | ICD-10-CM | POA: Diagnosis not present

## 2016-03-18 DIAGNOSIS — I10 Essential (primary) hypertension: Secondary | ICD-10-CM | POA: Diagnosis not present

## 2016-03-20 DIAGNOSIS — M150 Primary generalized (osteo)arthritis: Secondary | ICD-10-CM | POA: Diagnosis not present

## 2016-03-20 DIAGNOSIS — E785 Hyperlipidemia, unspecified: Secondary | ICD-10-CM | POA: Diagnosis not present

## 2016-03-20 DIAGNOSIS — F329 Major depressive disorder, single episode, unspecified: Secondary | ICD-10-CM | POA: Diagnosis not present

## 2016-03-20 DIAGNOSIS — Z96651 Presence of right artificial knee joint: Secondary | ICD-10-CM | POA: Diagnosis not present

## 2016-03-20 DIAGNOSIS — Z471 Aftercare following joint replacement surgery: Secondary | ICD-10-CM | POA: Diagnosis not present

## 2016-03-20 DIAGNOSIS — I10 Essential (primary) hypertension: Secondary | ICD-10-CM | POA: Diagnosis not present

## 2016-03-24 DIAGNOSIS — Z471 Aftercare following joint replacement surgery: Secondary | ICD-10-CM | POA: Diagnosis not present

## 2016-03-24 DIAGNOSIS — E785 Hyperlipidemia, unspecified: Secondary | ICD-10-CM | POA: Diagnosis not present

## 2016-03-24 DIAGNOSIS — M150 Primary generalized (osteo)arthritis: Secondary | ICD-10-CM | POA: Diagnosis not present

## 2016-03-24 DIAGNOSIS — F329 Major depressive disorder, single episode, unspecified: Secondary | ICD-10-CM | POA: Diagnosis not present

## 2016-03-24 DIAGNOSIS — I10 Essential (primary) hypertension: Secondary | ICD-10-CM | POA: Diagnosis not present

## 2016-03-24 DIAGNOSIS — Z96651 Presence of right artificial knee joint: Secondary | ICD-10-CM | POA: Diagnosis not present

## 2016-03-26 DIAGNOSIS — F329 Major depressive disorder, single episode, unspecified: Secondary | ICD-10-CM | POA: Diagnosis not present

## 2016-03-26 DIAGNOSIS — M150 Primary generalized (osteo)arthritis: Secondary | ICD-10-CM | POA: Diagnosis not present

## 2016-03-26 DIAGNOSIS — E785 Hyperlipidemia, unspecified: Secondary | ICD-10-CM | POA: Diagnosis not present

## 2016-03-26 DIAGNOSIS — Z471 Aftercare following joint replacement surgery: Secondary | ICD-10-CM | POA: Diagnosis not present

## 2016-03-26 DIAGNOSIS — I10 Essential (primary) hypertension: Secondary | ICD-10-CM | POA: Diagnosis not present

## 2016-03-26 DIAGNOSIS — Z96651 Presence of right artificial knee joint: Secondary | ICD-10-CM | POA: Diagnosis not present

## 2016-03-31 DIAGNOSIS — F329 Major depressive disorder, single episode, unspecified: Secondary | ICD-10-CM | POA: Diagnosis not present

## 2016-03-31 DIAGNOSIS — Z96651 Presence of right artificial knee joint: Secondary | ICD-10-CM | POA: Diagnosis not present

## 2016-03-31 DIAGNOSIS — I10 Essential (primary) hypertension: Secondary | ICD-10-CM | POA: Diagnosis not present

## 2016-03-31 DIAGNOSIS — M150 Primary generalized (osteo)arthritis: Secondary | ICD-10-CM | POA: Diagnosis not present

## 2016-03-31 DIAGNOSIS — E785 Hyperlipidemia, unspecified: Secondary | ICD-10-CM | POA: Diagnosis not present

## 2016-03-31 DIAGNOSIS — Z471 Aftercare following joint replacement surgery: Secondary | ICD-10-CM | POA: Diagnosis not present

## 2016-04-02 DIAGNOSIS — M1711 Unilateral primary osteoarthritis, right knee: Secondary | ICD-10-CM | POA: Diagnosis not present

## 2016-04-02 DIAGNOSIS — M25561 Pain in right knee: Secondary | ICD-10-CM | POA: Diagnosis not present

## 2016-04-02 DIAGNOSIS — M25461 Effusion, right knee: Secondary | ICD-10-CM | POA: Diagnosis not present

## 2016-04-02 DIAGNOSIS — Z471 Aftercare following joint replacement surgery: Secondary | ICD-10-CM | POA: Diagnosis not present

## 2016-04-02 DIAGNOSIS — Z96651 Presence of right artificial knee joint: Secondary | ICD-10-CM | POA: Diagnosis not present

## 2016-04-02 DIAGNOSIS — R262 Difficulty in walking, not elsewhere classified: Secondary | ICD-10-CM | POA: Diagnosis not present

## 2016-04-07 DIAGNOSIS — Z471 Aftercare following joint replacement surgery: Secondary | ICD-10-CM | POA: Diagnosis not present

## 2016-04-07 DIAGNOSIS — R262 Difficulty in walking, not elsewhere classified: Secondary | ICD-10-CM | POA: Diagnosis not present

## 2016-04-07 DIAGNOSIS — M25561 Pain in right knee: Secondary | ICD-10-CM | POA: Diagnosis not present

## 2016-04-07 DIAGNOSIS — M25461 Effusion, right knee: Secondary | ICD-10-CM | POA: Diagnosis not present

## 2016-04-07 DIAGNOSIS — Z96651 Presence of right artificial knee joint: Secondary | ICD-10-CM | POA: Diagnosis not present

## 2016-04-09 DIAGNOSIS — Z471 Aftercare following joint replacement surgery: Secondary | ICD-10-CM | POA: Diagnosis not present

## 2016-04-09 DIAGNOSIS — R262 Difficulty in walking, not elsewhere classified: Secondary | ICD-10-CM | POA: Diagnosis not present

## 2016-04-09 DIAGNOSIS — Z96651 Presence of right artificial knee joint: Secondary | ICD-10-CM | POA: Diagnosis not present

## 2016-04-09 DIAGNOSIS — M25561 Pain in right knee: Secondary | ICD-10-CM | POA: Diagnosis not present

## 2016-04-14 DIAGNOSIS — M25561 Pain in right knee: Secondary | ICD-10-CM | POA: Diagnosis not present

## 2016-04-14 DIAGNOSIS — Z471 Aftercare following joint replacement surgery: Secondary | ICD-10-CM | POA: Diagnosis not present

## 2016-04-14 DIAGNOSIS — R262 Difficulty in walking, not elsewhere classified: Secondary | ICD-10-CM | POA: Diagnosis not present

## 2016-04-14 DIAGNOSIS — Z96651 Presence of right artificial knee joint: Secondary | ICD-10-CM | POA: Diagnosis not present

## 2016-04-16 DIAGNOSIS — Z471 Aftercare following joint replacement surgery: Secondary | ICD-10-CM | POA: Diagnosis not present

## 2016-04-16 DIAGNOSIS — R262 Difficulty in walking, not elsewhere classified: Secondary | ICD-10-CM | POA: Diagnosis not present

## 2016-04-16 DIAGNOSIS — M25561 Pain in right knee: Secondary | ICD-10-CM | POA: Diagnosis not present

## 2016-04-16 DIAGNOSIS — Z96651 Presence of right artificial knee joint: Secondary | ICD-10-CM | POA: Diagnosis not present

## 2016-04-21 DIAGNOSIS — Z471 Aftercare following joint replacement surgery: Secondary | ICD-10-CM | POA: Diagnosis not present

## 2016-04-21 DIAGNOSIS — R262 Difficulty in walking, not elsewhere classified: Secondary | ICD-10-CM | POA: Diagnosis not present

## 2016-04-21 DIAGNOSIS — Z96651 Presence of right artificial knee joint: Secondary | ICD-10-CM | POA: Diagnosis not present

## 2016-04-21 DIAGNOSIS — M25561 Pain in right knee: Secondary | ICD-10-CM | POA: Diagnosis not present

## 2016-04-23 DIAGNOSIS — M25561 Pain in right knee: Secondary | ICD-10-CM | POA: Diagnosis not present

## 2016-04-23 DIAGNOSIS — Z471 Aftercare following joint replacement surgery: Secondary | ICD-10-CM | POA: Diagnosis not present

## 2016-04-23 DIAGNOSIS — Z96651 Presence of right artificial knee joint: Secondary | ICD-10-CM | POA: Diagnosis not present

## 2016-04-23 DIAGNOSIS — R262 Difficulty in walking, not elsewhere classified: Secondary | ICD-10-CM | POA: Diagnosis not present

## 2016-04-27 DIAGNOSIS — R262 Difficulty in walking, not elsewhere classified: Secondary | ICD-10-CM | POA: Diagnosis not present

## 2016-04-27 DIAGNOSIS — M25561 Pain in right knee: Secondary | ICD-10-CM | POA: Diagnosis not present

## 2016-04-27 DIAGNOSIS — Z96651 Presence of right artificial knee joint: Secondary | ICD-10-CM | POA: Diagnosis not present

## 2016-04-27 DIAGNOSIS — Z471 Aftercare following joint replacement surgery: Secondary | ICD-10-CM | POA: Diagnosis not present

## 2016-04-28 ENCOUNTER — Other Ambulatory Visit: Payer: Self-pay | Admitting: Family Medicine

## 2016-04-28 DIAGNOSIS — M25561 Pain in right knee: Secondary | ICD-10-CM | POA: Diagnosis not present

## 2016-04-30 DIAGNOSIS — M25561 Pain in right knee: Secondary | ICD-10-CM | POA: Diagnosis not present

## 2016-04-30 DIAGNOSIS — Z96651 Presence of right artificial knee joint: Secondary | ICD-10-CM | POA: Diagnosis not present

## 2016-04-30 DIAGNOSIS — R262 Difficulty in walking, not elsewhere classified: Secondary | ICD-10-CM | POA: Diagnosis not present

## 2016-04-30 DIAGNOSIS — Z471 Aftercare following joint replacement surgery: Secondary | ICD-10-CM | POA: Diagnosis not present

## 2016-05-01 NOTE — Telephone Encounter (Signed)
Please fax or call into pharmacy.  Please clarify mail order or local pharmacy.

## 2016-05-02 NOTE — Telephone Encounter (Signed)
Left message for patient to return call and clarify what pharmacy to send RX

## 2016-05-02 NOTE — Telephone Encounter (Signed)
Patient wants it called into [facility] on [address]

## 2016-05-04 ENCOUNTER — Telehealth: Payer: Self-pay

## 2016-05-04 NOTE — Telephone Encounter (Signed)
Patient stated she spoke with someone on Saturday stating Xanax 0.25 MG will be called in. Patient stated she called [facility] on [address] and this is no medication for patient to pick up. [phone].

## 2016-05-04 NOTE — Telephone Encounter (Signed)
Faxed today

## 2016-05-04 NOTE — Telephone Encounter (Signed)
Faxed

## 2016-07-09 DIAGNOSIS — M1712 Unilateral primary osteoarthritis, left knee: Secondary | ICD-10-CM | POA: Diagnosis not present

## 2016-07-09 DIAGNOSIS — Z96651 Presence of right artificial knee joint: Secondary | ICD-10-CM | POA: Diagnosis not present

## 2016-07-09 DIAGNOSIS — Z09 Encounter for follow-up examination after completed treatment for conditions other than malignant neoplasm: Secondary | ICD-10-CM | POA: Diagnosis not present

## 2016-08-06 DIAGNOSIS — M25561 Pain in right knee: Secondary | ICD-10-CM | POA: Diagnosis not present

## 2016-08-07 DIAGNOSIS — M25561 Pain in right knee: Secondary | ICD-10-CM | POA: Diagnosis not present

## 2016-08-07 DIAGNOSIS — M228X1 Other disorders of patella, right knee: Secondary | ICD-10-CM | POA: Diagnosis not present

## 2016-08-07 DIAGNOSIS — M62461 Contracture of muscle, right lower leg: Secondary | ICD-10-CM | POA: Diagnosis not present

## 2016-08-18 DIAGNOSIS — M25561 Pain in right knee: Secondary | ICD-10-CM | POA: Diagnosis not present

## 2016-09-01 DIAGNOSIS — M1712 Unilateral primary osteoarthritis, left knee: Secondary | ICD-10-CM | POA: Diagnosis not present

## 2016-09-08 ENCOUNTER — Encounter: Payer: Self-pay | Admitting: Family Medicine

## 2016-09-08 ENCOUNTER — Telehealth: Payer: Self-pay

## 2016-09-08 ENCOUNTER — Ambulatory Visit (INDEPENDENT_AMBULATORY_CARE_PROVIDER_SITE_OTHER): Payer: Medicare Other | Admitting: Family Medicine

## 2016-09-08 VITALS — BP 158/90 | HR 92 | Temp 98.7°F | Resp 18 | Ht 62.5 in | Wt 176.0 lb

## 2016-09-08 DIAGNOSIS — E66811 Obesity, class 1: Secondary | ICD-10-CM

## 2016-09-08 DIAGNOSIS — E2839 Other primary ovarian failure: Secondary | ICD-10-CM | POA: Diagnosis not present

## 2016-09-08 DIAGNOSIS — Z720 Tobacco use: Secondary | ICD-10-CM

## 2016-09-08 DIAGNOSIS — E669 Obesity, unspecified: Secondary | ICD-10-CM | POA: Diagnosis not present

## 2016-09-08 DIAGNOSIS — I10 Essential (primary) hypertension: Secondary | ICD-10-CM

## 2016-09-08 DIAGNOSIS — E7800 Pure hypercholesterolemia, unspecified: Secondary | ICD-10-CM | POA: Diagnosis not present

## 2016-09-08 DIAGNOSIS — F418 Other specified anxiety disorders: Secondary | ICD-10-CM | POA: Diagnosis not present

## 2016-09-08 LAB — CBC WITH DIFFERENTIAL/PLATELET
BASOS PCT: 0 %
Basophils Absolute: 0 cells/uL (ref 0–200)
EOS ABS: 368 {cells}/uL (ref 15–500)
Eosinophils Relative: 4 %
HEMATOCRIT: 45.8 % — AB (ref 35.0–45.0)
HEMOGLOBIN: 15.4 g/dL (ref 11.7–15.5)
LYMPHS ABS: 2576 {cells}/uL (ref 850–3900)
Lymphocytes Relative: 28 %
MCH: 31 pg (ref 27.0–33.0)
MCHC: 33.6 g/dL (ref 32.0–36.0)
MCV: 92.3 fL (ref 80.0–100.0)
MONO ABS: 736 {cells}/uL (ref 200–950)
MONOS PCT: 8 %
MPV: 10.1 fL (ref 7.5–12.5)
NEUTROS ABS: 5520 {cells}/uL (ref 1500–7800)
Neutrophils Relative %: 60 %
PLATELETS: 247 10*3/uL (ref 140–400)
RBC: 4.96 MIL/uL (ref 3.80–5.10)
RDW: 13.8 % (ref 11.0–15.0)
WBC: 9.2 10*3/uL (ref 3.8–10.8)

## 2016-09-08 LAB — COMPREHENSIVE METABOLIC PANEL
ALK PHOS: 66 U/L (ref 33–130)
ALT: 11 U/L (ref 6–29)
AST: 16 U/L (ref 10–35)
Albumin: 3.9 g/dL (ref 3.6–5.1)
BUN: 14 mg/dL (ref 7–25)
CO2: 26 mmol/L (ref 20–31)
Calcium: 9.5 mg/dL (ref 8.6–10.4)
Chloride: 102 mmol/L (ref 98–110)
Creat: 0.88 mg/dL (ref 0.50–0.99)
Glucose, Bld: 95 mg/dL (ref 65–99)
POTASSIUM: 5 mmol/L (ref 3.5–5.3)
Sodium: 137 mmol/L (ref 135–146)
TOTAL PROTEIN: 6.6 g/dL (ref 6.1–8.1)
Total Bilirubin: 0.6 mg/dL (ref 0.2–1.2)

## 2016-09-08 LAB — LIPID PANEL
CHOL/HDL RATIO: 2.9 ratio (ref <=5.0)
CHOLESTEROL: 228 mg/dL — AB (ref 125–200)
HDL: 80 mg/dL (ref >=46)
LDL Cholesterol: 125 mg/dL (ref <130)
Triglycerides: 117 mg/dL (ref <150)
VLDL: 23 mg/dL (ref <30)

## 2016-09-08 MED ORDER — ALPRAZOLAM 0.25 MG PO TABS: ORAL_TABLET | ORAL | 1 refills | Status: DC

## 2016-09-08 NOTE — Progress Notes (Signed)
Subjective:    Patient ID: Nicole Peters, female    DOB: Dec 09, 1950, 65 y.o.   MRN: [id number]  09/08/2016  Follow-up (BLOOD PRESSURE)   HPI This 65 y.o. female presents for six month follow-up of hypertension, hypercholesterolemia, anxiety/depression, and OA knees.  137/77 home BP this morning.  Checks BP once at home once weekly. Stopped ASA 81mg  when taking ASA 325mg  after TKR R.    Anxiety: Surviving husband's retirement; actually very very good having husband at home.Soemtimes pushes too hard; husband with neuropathy.  Taking Zyrtec for allergic rhinitis currently.   S/p R total knee replacement; pain much better.  On July 4th, at brother's house; reached down and popped foot rest up; brother injured knee; on 08/07/16 had artrhscopy with clean up; mild slippage.  Minimal pain.  It was perfect.  Dr. Berenice Primas.  Review of Systems  Constitutional: Negative for chills, diaphoresis, fatigue and fever.  Eyes: Negative for visual disturbance.  Respiratory: Negative for cough and shortness of breath.   Cardiovascular: Negative for chest pain, palpitations and leg swelling.  Gastrointestinal: Negative for abdominal pain, constipation, diarrhea, nausea and vomiting.  Endocrine: Negative for cold intolerance, heat intolerance, polydipsia, polyphagia and polyuria.  Neurological: Negative for dizziness, tremors, seizures, syncope, facial asymmetry, speech difficulty, weakness, light-headedness, numbness and headaches.    Past Medical History:  Diagnosis Date  . Anxiety   . Arthritis   . Depression   . History of bronchitis   . Hyperlipidemia   . Hypertension   . Stress incontinence    Past Surgical History:  Procedure Laterality Date  . APPENDECTOMY    . Arthroscopic knee surgery     Both knees- Dr. Berenice Primas  . CHOLECYSTECTOMY    . COLONOSCOPY  12/22/2007   Diverticulosis. [facility]. Repeat in 10 years.  . INJECTION KNEE Left 03/16/2016   Procedure: LEFT KNEE CORTISONE  INJECTION. ;  Surgeon: Dorna Leitz, MD;  Location: [facility];  Service: Orthopedics;  Laterality: Left;  . KNEE SURGERY Bilateral    arthroscopic  . TONSILLECTOMY    . TOTAL KNEE ARTHROPLASTY Right 03/16/2016   Procedure: RIGHT TOTAL KNEE ARTHROPLASTY ( LATERAL APPROACH) & LEFT KNEE CORTISONE INJECTION. ;  Surgeon: Dorna Leitz, MD;  Location: [facility];  Service: Orthopedics;  Laterality: Right;  . TUBAL LIGATION     No Known Allergies Current Outpatient Prescriptions  Medication Sig Dispense Refill  . ALPRAZolam (XANAX) 0.25 MG tablet TAKE 1 TO 1& 1/2 TABLETS BY MOUTH DAILY 135 tablet 1  . benazepril (LOTENSIN) 20 MG tablet Take 1 tablet (20 mg total) by mouth daily. 90 tablet 3  . buPROPion (WELLBUTRIN XL) 300 MG 24 hr tablet Take 1 tablet (300 mg total) by mouth daily. 90 tablet 3  . cetirizine (ZYRTEC) 10 MG tablet Take 10 mg by mouth daily as needed for allergies.     . Cholecalciferol (VITAMIN D) 2000 units tablet Take 2,000 Units by mouth daily.    . Multiple Vitamin (MULTIVITAMIN) tablet Take 1 tablet by mouth daily.    . pravastatin (PRAVACHOL) 80 MG tablet Take 1 tablet (80 mg total) by mouth at bedtime. 90 tablet 3  . aspirin EC 325 MG tablet Take 1 tablet (325 mg total) by mouth 2 (two) times daily after a meal. Take x 1 month post op to decrease risk of blood clots. (Patient not taking: Reported on 09/08/2016) 60 tablet 0  . oxyCODONE-acetaminophen (PERCOCET/ROXICET) 5-325 MG tablet Take 1-2 tablets by mouth every 4 (four) hours  as needed for severe pain. (Patient not taking: Reported on 09/08/2016) 60 tablet 0  . tiZANidine (ZANAFLEX) 2 MG tablet Take 1 tablet (2 mg total) by mouth every 8 (eight) hours as needed for muscle spasms. (Patient not taking: Reported on 09/08/2016) 50 tablet 0   No current facility-administered medications for this visit.    Social History   Social History  . Marital status: Married    Spouse name: Merry Proud  . Number of children: N/A  . Years of education:  N/A   Occupational History  . Not on file.   Social History Main Topics  . Smoking status: Current Every Day Smoker    Packs/day: 0.25    Types: Cigarettes  . Smokeless tobacco: Never Used     Comment: "smoke about 3 cigarettes a day"  . Alcohol use No  . Drug use: No  . Sexual activity: Yes   Other Topics Concern  . Not on file   Social History Narrative   Marital status: married x 31 years; second husband ;happily; no abuse.      Children: one child (40); one grandchild (51yo)      Lives: with husband.      Employment:  Agricultural engineer.  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      Tobacco:  5 cigarettes per day.  Smoking since age 33.  Quit in 01/2015.      Alcohol:  None      Drugs: none      Exercise: no exercise due to R oasteoarthritis knee.       Seatbelt:  100%       Guns:  Loaded; secured.      ADLs: indepdendent with ADLs.   Family History  Problem Relation Age of Onset  . Cancer Father     stomach cancer with liver mets  . Heart disease Brother     mild heart attack       Objective:    BP (!) 158/90 (BP Location: Left Arm, Patient Position: Sitting, Cuff Size: Large)   Pulse 92   Temp 98.7 F (37.1 C) (Oral)   Resp 18   Ht 5' 2.5" (1.588 m)   Wt 176 lb (79.8 kg)   BMI 31.68 kg/m  Physical Exam  Constitutional: She is oriented to person, place, and time. She appears well-developed and well-nourished. No distress.  HENT:  Head: Normocephalic and atraumatic.  Eyes: Conjunctivae are normal. Pupils are equal, round, and reactive to light.  Neck: Normal range of motion. Neck supple.  Cardiovascular: Normal rate, regular rhythm and normal heart sounds.  Exam reveals no gallop and no friction rub.   No murmur heard. Pulmonary/Chest: Effort normal and breath sounds normal. She has no wheezes. She has no rales.  Neurological: She is alert and oriented to person, place, and time.  Skin: She is not diaphoretic.  Psychiatric: She has a normal mood and affect.  Her behavior is normal.  Nursing note and vitals reviewed.       Assessment & Plan:   1. Essential hypertension   2. Obesity (BMI 30.0-34.9)   3. Depression with anxiety   4. Tobacco user   5. Pure hypercholesterolemia   6. Estrogen deficiency    -controlled; obtain labs; refills provided. -refer for bone density scan. -recommend tobacco cessation; pt contemplative. -recommend weight loss, exercise, and low-calorie and low-fat food choices.   Orders Placed This Encounter  Procedures  . DG Bone Density    09/09/16 NP//FAXED BACK TO OFFICE,  PT SCHEDULED AT [facility]    Standing Status:   Future    Standing Expiration Date:   11/08/2017    Scheduling Instructions:     [facility]    Order Specific Question:   Reason for Exam (SYMPTOM  OR DIAGNOSIS REQUIRED)    Answer:   estrogen deficiency    Order Specific Question:   Preferred imaging location?    Answer:   External  . CBC with Differential/Platelet  . Comprehensive metabolic panel    Order Specific Question:   Has the patient fasted?    Answer:   Yes  . Lipid panel    Order Specific Question:   Has the patient fasted?    Answer:   Yes   Meds ordered this encounter  Medications  . ALPRAZolam (XANAX) 0.25 MG tablet    Sig: TAKE 1 TO 1& 1/2 TABLETS BY MOUTH DAILY    Dispense:  135 tablet    Refill:  1    Return in about 6 months (around 03/09/2017) for complete physical examiniation.   Pebble Botkin Elayne Guerin, M.D. [facility] [address] [place], [place]  [phone] phone [phone] fax

## 2016-09-08 NOTE — Patient Instructions (Addendum)
IF you received an x-ray today, you will receive an invoice from [facility]. Please contact [facility] at [phone] with questions or concerns regarding your invoice.   IF you received labwork today, you will receive an invoice from [employer]. Please contact [facility] at [phone] with questions or concerns regarding your invoice.   Our billing staff will not be able to assist you with questions regarding bills from these companies.  You will be contacted with the lab results as soon as they are available. The fastest way to get your results is to activate your My Chart account. Instructions are located on the last page of this paperwork. If you have not heard from [place] regarding the results in 2 weeks, please contact this office.     High Cholesterol High cholesterol refers to having a high level of cholesterol in your blood. Cholesterol is a white, waxy, fat-like protein that your body needs in small amounts. Your liver makes all the cholesterol you need. Excess cholesterol comes from the food you eat. Cholesterol travels in your bloodstream through your blood vessels. If you have high cholesterol, deposits (plaque) may build up on the walls of your blood vessels. This makes the arteries narrower and stiffer. Plaque increases your risk of heart attack and stroke. Work with your health care provider to keep your cholesterol levels in a healthy range. RISK FACTORS Several things can make you more likely to have high cholesterol. These include:   Eating foods high in animal fat (saturated fat) or cholesterol.  Being overweight.  Not getting enough exercise.  Having a family history of high cholesterol. SIGNS AND SYMPTOMS High cholesterol does not cause symptoms. DIAGNOSIS  Your health care provider can do a blood test to check whether you have high cholesterol. If you are older than 20, your health care provider may check your  cholesterol every 4-6 years. You may be checked more often if you already have high cholesterol or other risk factors for heart disease. The blood test for cholesterol measures the following:  Bad cholesterol (LDL cholesterol). This is the type of cholesterol that causes heart disease. This number should be less than 100.  Good cholesterol (HDL cholesterol). This type helps protect against heart disease. A healthy level of HDL cholesterol is 60 or higher.  Total cholesterol. This is the combined number of LDL cholesterol and HDL cholesterol. A healthy number is less than 200. TREATMENT  High cholesterol can be treated with diet changes, lifestyle changes, and medicine.   Diet changes may include eating more whole grains, fruits, vegetables, nuts, and fish. You may also have to cut back on red meat and foods with a lot of added sugar.  Lifestyle changes may include getting at least 40 minutes of aerobic exercise three times a week. Aerobic exercises include walking, biking, and swimming. Aerobic exercise along with a healthy diet can help you maintain a healthy weight. Lifestyle changes may also include quitting smoking.  If diet and lifestyle changes are not enough to lower your cholesterol, your health care provider may prescribe a statin medicine. This medicine has been shown to lower cholesterol and also lower the risk of heart disease. HOME CARE INSTRUCTIONS  Only take over-the-counter or prescription medicines as directed by your health care provider.   Follow a healthy diet as directed by your health care provider. For instance:   Eat chicken (without skin), fish, veal, shellfish, ground [place] breast, and round or loin cuts of  red meat.  Do not eat fried foods and fatty meats, such as hot dogs and salami.   Eat plenty of fruits, such as apples.   Eat plenty of vegetables, such as broccoli, potatoes, and carrots.   Eat beans, peas, and lentils.   Eat grains, such as barley,  rice, couscous, and bulgur wheat.   Eat pasta without cream sauces.   Use skim or nonfat milk and low-fat or nonfat yogurt and cheeses. Do not eat or drink whole milk, cream, ice cream, egg yolks, and hard cheeses.   Do not eat stick margarine or tub margarines that contain trans fats (also called partially hydrogenated oils).   Do not eat cakes, cookies, crackers, or other baked goods that contain trans fats.   Do not eat saturated tropical oils, such as coconut and palm oil.   Exercise as directed by your health care provider. Increase your activity level with activities such as gardening or walking.   Keep all follow-up appointments.  SEEK MEDICAL CARE IF:  You are struggling to maintain a healthy diet or weight.  You need help starting an exercise program.  You need help to stop smoking. SEEK IMMEDIATE MEDICAL CARE IF:  You have chest pain.  You have trouble breathing.   This information is not intended to replace advice given to you by your health care provider. Make sure you discuss any questions you have with your health care provider.   Document Released: 11/09/2005 Document Revised: 11/30/2014 Document Reviewed: 09/01/2013 Elsevier Interactive Patient Education [employer].

## 2016-09-08 NOTE — Telephone Encounter (Signed)
Dr Orlinda Blalock has an appointment with [facility] on Friday.  Please sign the orders for her mammogram.

## 2016-09-10 NOTE — Telephone Encounter (Signed)
Patient called again today. I printed the order and had Philis Fendt sign since Dr Tamala Julian is out of the office today/tomorrow. Faxed to [place].

## 2016-09-11 ENCOUNTER — Encounter: Payer: Self-pay | Admitting: Family Medicine

## 2016-09-11 DIAGNOSIS — M810 Age-related osteoporosis without current pathological fracture: Secondary | ICD-10-CM | POA: Diagnosis not present

## 2016-10-10 ENCOUNTER — Telehealth: Payer: Self-pay | Admitting: Radiology

## 2016-10-22 ENCOUNTER — Ambulatory Visit (INDEPENDENT_AMBULATORY_CARE_PROVIDER_SITE_OTHER): Payer: Medicare Other | Admitting: Family Medicine

## 2016-10-22 VITALS — HR 102 | Temp 98.4°F | Resp 17 | Ht 62.5 in | Wt 176.0 lb

## 2016-10-22 DIAGNOSIS — M810 Age-related osteoporosis without current pathological fracture: Secondary | ICD-10-CM | POA: Insufficient documentation

## 2016-10-22 MED ORDER — ALENDRONATE SODIUM 70 MG PO TABS: 70.0000 mg | ORAL_TABLET | ORAL | 3 refills | Status: DC

## 2016-10-22 NOTE — Progress Notes (Signed)
Subjective:    Patient ID: Nicole Peters, female    DOB: Aug 25, 1951, 65 y.o.   MRN: [id number]  10/22/2016  Follow-up (Review bone density test results)   HPI This 65 y.o. female presents for discussion of bone density results.  Bone density in osteoporosis range.  Likes greek yogurt.  Does not like milk.  No exercise at this time; stretches every morning and performs leg lifts.  May need repeat arthroscopic surgery of R knee from recent fall on R knee. Catching.  Review of Systems  Constitutional: Negative for chills, diaphoresis, fatigue and fever.  Musculoskeletal: Positive for arthralgias.    Past Medical History:  Diagnosis Date  . Anxiety   . Arthritis   . Depression   . History of bronchitis   . Hyperlipidemia   . Hypertension   . Stress incontinence    Past Surgical History:  Procedure Laterality Date  . APPENDECTOMY    . Arthroscopic knee surgery     Both knees- Dr. Berenice Primas  . CHOLECYSTECTOMY    . COLONOSCOPY  12/22/2007   Diverticulosis. [facility]. Repeat in 10 years.  . INJECTION KNEE Left 03/16/2016   Procedure: LEFT KNEE CORTISONE INJECTION. ;  Surgeon: Dorna Leitz, MD;  Location: [place];  Service: Orthopedics;  Laterality: Left;  . KNEE SURGERY Bilateral    arthroscopic  . TONSILLECTOMY    . TOTAL KNEE ARTHROPLASTY Right 03/16/2016   Procedure: RIGHT TOTAL KNEE ARTHROPLASTY ( LATERAL APPROACH) & LEFT KNEE CORTISONE INJECTION. ;  Surgeon: Dorna Leitz, MD;  Location: [place];  Service: Orthopedics;  Laterality: Right;  . TUBAL LIGATION     No Known Allergies  Social History   Social History  . Marital status: Married    Spouse name: Merry Proud  . Number of children: N/A  . Years of education: N/A   Occupational History  . Not on file.   Social History Main Topics  . Smoking status: Current Every Day Smoker    Packs/day: 0.25    Types: Cigarettes  . Smokeless tobacco: Never Used     Comment: "smoke about 3 cigarettes a day"  . Alcohol use  No  . Drug use: No  . Sexual activity: Yes   Other Topics Concern  . Not on file   Social History Narrative   Marital status: married x 31 years; second husband ;happily; no abuse.      Children: one child (56); one grandchild (78yo)      Lives: with husband.      Employment:  Agricultural engineer.  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      Tobacco:  5 cigarettes per day.  Smoking since age 82.  Quit in 01/2015.      Alcohol:  None      Drugs: none      Exercise: no exercise due to R oasteoarthritis knee.       Seatbelt:  100%       Guns:  Loaded; secured.      ADLs: indepdendent with ADLs.   Family History  Problem Relation Age of Onset  . Cancer Father     stomach cancer with liver mets  . Heart disease Brother     mild heart attack       Objective:    Pulse (!) 102   Temp 98.4 F (36.9 C) (Oral)   Resp 17   Ht 5' 2.5" (1.588 m)   Wt 176 lb (79.8 kg)   SpO2 92%   BMI  31.68 kg/m  Physical Exam  Constitutional: She is oriented to person, place, and time. She appears well-developed and well-nourished. No distress.  HENT:  Head: Normocephalic and atraumatic.  Eyes: Conjunctivae are normal. Pupils are equal, round, and reactive to light.  Neck: Normal range of motion. Neck supple.  Cardiovascular: Normal rate, regular rhythm and normal heart sounds.  Exam reveals no gallop and no friction rub.   No murmur heard. Pulmonary/Chest: Effort normal and breath sounds normal. She has no wheezes. She has no rales.  Neurological: She is alert and oriented to person, place, and time.  Skin: She is not diaphoretic.  Psychiatric: She has a normal mood and affect. Her behavior is normal.  Nursing note and vitals reviewed.  Results for orders placed or performed in visit on 09/08/16  CBC with Differential/Platelet  Result Value Ref Range   WBC 9.2 3.8 - 10.8 K/uL   RBC 4.96 3.80 - 5.10 MIL/uL   Hemoglobin 15.4 11.7 - 15.5 g/dL   HCT 45.8 (H) 35.0 - 45.0 %   MCV 92.3 80.0 - 100.0  fL   MCH 31.0 27.0 - 33.0 pg   MCHC 33.6 32.0 - 36.0 g/dL   RDW 13.8 11.0 - 15.0 %   Platelets 247 140 - 400 K/uL   MPV 10.1 7.5 - 12.5 fL   Neutro Abs 5,520 1,500 - 7,800 cells/uL   Lymphs Abs 2,576 850 - 3,900 cells/uL   Monocytes Absolute 736 200 - 950 cells/uL   Eosinophils Absolute 368 15 - 500 cells/uL   Basophils Absolute 0 0 - 200 cells/uL   Neutrophils Relative % 60 %   Lymphocytes Relative 28 %   Monocytes Relative 8 %   Eosinophils Relative 4 %   Basophils Relative 0 %   Smear Review Criteria for review not met   Comprehensive metabolic panel  Result Value Ref Range   Sodium 137 135 - 146 mmol/L   Potassium 5.0 3.5 - 5.3 mmol/L   Chloride 102 98 - 110 mmol/L   CO2 26 20 - 31 mmol/L   Glucose, Bld 95 65 - 99 mg/dL   BUN 14 7 - 25 mg/dL   Creat 0.88 0.50 - 0.99 mg/dL   Total Bilirubin 0.6 0.2 - 1.2 mg/dL   Alkaline Phosphatase 66 33 - 130 U/L   AST 16 10 - 35 U/L   ALT 11 6 - 29 U/L   Total Protein 6.6 6.1 - 8.1 g/dL   Albumin 3.9 3.6 - 5.1 g/dL   Calcium 9.5 8.6 - 10.4 mg/dL  Lipid panel  Result Value Ref Range   Cholesterol 228 (H) 125 - 200 mg/dL   Triglycerides 117 <150 mg/dL   HDL 80 >=46 mg/dL   Total CHOL/HDL Ratio 2.9 <=5.0 Ratio   VLDL 23 <30 mg/dL   LDL Cholesterol 125 <130 mg/dL       Assessment & Plan:   1. Age-related osteoporosis without current pathological fracture    -New.  Discussed diagnosis and treatment options at length.  Calcium 3 servings daily or 1247m daily.  Vitamin D 2000 IU daily. Weight bearing exercise daily.  Repeat bone density scan in two years.    No orders of the defined types were placed in this encounter.  Meds ordered this encounter  Medications  . aspirin 75 MG chewable tablet    Sig: Chew 75 mg by mouth daily.  .Marland Kitchenalendronate (FOSAMAX) 70 MG tablet    Sig: Take 1 tablet (70 mg total)  by mouth every 7 (seven) days. Take with a full glass of water on an empty stomach.    Dispense:  12 tablet    Refill:  3     No Follow-up on file.   Hassaan Crite Elayne Guerin, M.D. [facility] [address] [place], [place]  [address] [phone] phone [phone] fax

## 2016-10-22 NOTE — Patient Instructions (Addendum)
IF you received an x-ray today, you will receive an invoice from [facility]. Please contact [facility] at [phone] with questions or concerns regarding your invoice.   IF you received labwork today, you will receive an invoice from [employer]. Please contact [facility] at [phone] with questions or concerns regarding your invoice.   Our billing staff will not be able to assist you with questions regarding bills from these companies.  You will be contacted with the lab results as soon as they are available. The fastest way to get your results is to activate your My Chart account. Instructions are located on the last page of this paperwork. If you have not heard from [place] regarding the results in 2 weeks, please contact this office.      Osteoporosis Osteoporosis is the thinning and loss of density in the bones. Osteoporosis makes the bones more brittle, fragile, and likely to break (fracture). Over time, osteoporosis can cause the bones to become so weak that they fracture after a simple fall. The bones most likely to fracture are the bones in the hip, wrist, and spine. What are the causes? The exact cause is not known. What increases the risk? Anyone can develop osteoporosis. You may be at greater risk if you have a family history of the condition or have poor nutrition. You may also have a higher risk if you are:  Female.  65 years old or older.  A smoker.  Not physically active.  White or Asian.  Slender. What are the signs or symptoms? A fracture might be the first sign of the disease, especially if it results from a fall or injury that would not usually cause a bone to break. Other signs and symptoms include:  Low back and neck pain.  Stooped posture.  Height loss. How is this diagnosed? To make a diagnosis, your health care provider may:  Take a medical history.  Perform a physical exam.  Order tests, such  as:  A bone mineral density test.  A dual-energy X-ray absorptiometry test. How is this treated? The goal of osteoporosis treatment is to strengthen your bones to reduce your risk of a fracture. Treatment may involve:  Making lifestyle changes, such as:  Eating a diet rich in calcium.  Doing weight-bearing and muscle-strengthening exercises.  Stopping tobacco use.  Limiting alcohol intake.  Taking medicine to slow the process of bone loss or to increase bone density.  Monitoring your levels of calcium and vitamin D. Follow these instructions at home:  Include calcium and vitamin D in your diet. Calcium is important for bone health, and vitamin D helps the body absorb calcium.  Perform weight-bearing and muscle-strengthening exercises as directed by your health care provider.  Do not use any tobacco products, including cigarettes, chewing tobacco, and electronic cigarettes. If you need help quitting, ask your health care provider.  Limit your alcohol intake.  Take medicines only as directed by your health care provider.  Keep all follow-up visits as directed by your health care provider. This is important.  Take precautions at home to lower your risk of falling, such as:  Keeping rooms well lit and clutter free.  Installing safety rails on stairs.  Using rubber mats in the bathroom and other areas that are often wet or slippery. Get help right away if: You fall or injure yourself. This information is not intended to replace advice given to you by your health care provider. Make sure you discuss  any questions you have with your health care provider. Document Released: 08/19/2005 Document Revised: 04/13/2016 Document Reviewed: 04/19/2014 Elsevier Interactive Patient Education  2017 [employer].

## 2016-12-11 ENCOUNTER — Telehealth: Payer: PRIVATE HEALTH INSURANCE | Admitting: Family

## 2016-12-11 DIAGNOSIS — J029 Acute pharyngitis, unspecified: Secondary | ICD-10-CM

## 2016-12-11 MED ORDER — PREDNISONE 5 MG PO TABS: 5.0000 mg | ORAL_TABLET | ORAL | 0 refills | Status: DC

## 2016-12-11 MED ORDER — BENZONATATE 100 MG PO CAPS: 100.0000 mg | ORAL_CAPSULE | Freq: Three times a day (TID) | ORAL | 0 refills | Status: DC | PRN

## 2016-12-11 NOTE — Progress Notes (Signed)
We are sorry that you are not feeling well.  Here is how we plan to help!  Based on what you have shared with me it looks like you have upper respiratory tract inflammation that has resulted in a significant cough.  Inflammation and infection in the upper respiratory tract is commonly called bronchitis and has four common causes:  Allergies, Viral Infections, Acid Reflux and Bacterial Infections.  Allergies, viruses and acid reflux are treated by controlling symptoms or eliminating the cause. An example might be a cough caused by taking certain blood pressure medications. You stop the cough by changing the medication. Another example might be a cough caused by acid reflux. Controlling the reflux helps control the cough.  Based on your presentation I believe you most likely have A cough due to a virus.  This is called viral bronchitis and is best treated by rest, plenty of fluids and control of the cough.  You may use Ibuprofen or Tylenol as directed to help your symptoms.     In addition you may use A non-prescription cough medication called Mucinex DM: take 2 tablets every 12 hours. and A prescription cough medication called Tessalon Perles 100mg . You may take 1-2 capsules every 8 hours as needed for your cough.  Sterapred 5 mg dosepak  USE OF BRONCHODILATOR ("RESCUE") INHALERS: There is a risk from using your bronchodilator too frequently.  The risk is that over-reliance on a medication which only relaxes the muscles surrounding the breathing tubes can reduce the effectiveness of medications prescribed to reduce swelling and congestion of the tubes themselves.  Although you feel brief relief from the bronchodilator inhaler, your asthma may actually be worsening with the tubes becoming more swollen and filled with mucus.  This can delay other crucial treatments, such as oral steroid medications. If you need to use a bronchodilator inhaler daily, several times per day, you should discuss this with your  provider.  There are probably better treatments that could be used to keep your asthma under control.     HOME CARE . Only take medications as instructed by your medical team. . Complete the entire course of an antibiotic. . Drink plenty of fluids and get plenty of rest. . Avoid close contacts especially the very young and the elderly . Cover your mouth if you cough or cough into your sleeve. . Always remember to wash your hands . A steam or ultrasonic humidifier can help congestion.   GET HELP RIGHT AWAY IF: . You develop worsening fever. . You become short of breath . You cough up blood. . Your symptoms persist after you have completed your treatment plan MAKE SURE YOU   Understand these instructions.  Will watch your condition.  Will get help right away if you are not doing well or get worse.  Your e-visit answers were reviewed by a board certified advanced clinical practitioner to complete your personal care plan.  Depending on the condition, your plan could have included both over the counter or prescription medications. If there is a problem please reply  once you have received a response from your provider. Your safety is important to [place].  If you have drug allergies check your prescription carefully.    You can use MyChart to ask questions about today's visit, request a non-urgent call back, or ask for a work or school excuse for 24 hours related to this e-Visit. If it has been greater than 24 hours you will need to follow up with your provider,  or enter a new e-Visit to address those concerns. You will get an e-mail in the next two days asking about your experience.  I hope that your e-visit has been valuable and will speed your recovery. Thank you for using e-visits.   

## 2017-01-12 DIAGNOSIS — M25561 Pain in right knee: Secondary | ICD-10-CM | POA: Diagnosis not present

## 2017-01-30 DIAGNOSIS — Z23 Encounter for immunization: Secondary | ICD-10-CM | POA: Diagnosis not present

## 2017-02-17 DIAGNOSIS — M659 Synovitis and tenosynovitis, unspecified: Secondary | ICD-10-CM | POA: Diagnosis not present

## 2017-02-17 DIAGNOSIS — M79661 Pain in right lower leg: Secondary | ICD-10-CM | POA: Diagnosis not present

## 2017-02-17 DIAGNOSIS — T8484XA Pain due to internal orthopedic prosthetic devices, implants and grafts, initial encounter: Secondary | ICD-10-CM | POA: Diagnosis not present

## 2017-02-17 DIAGNOSIS — Z96651 Presence of right artificial knee joint: Secondary | ICD-10-CM | POA: Diagnosis not present

## 2017-02-25 DIAGNOSIS — Z96651 Presence of right artificial knee joint: Secondary | ICD-10-CM | POA: Diagnosis not present

## 2017-02-25 DIAGNOSIS — M25561 Pain in right knee: Secondary | ICD-10-CM | POA: Diagnosis not present

## 2017-02-25 DIAGNOSIS — Z4789 Encounter for other orthopedic aftercare: Secondary | ICD-10-CM | POA: Diagnosis not present

## 2017-03-01 DIAGNOSIS — Z9889 Other specified postprocedural states: Secondary | ICD-10-CM | POA: Diagnosis not present

## 2017-03-09 ENCOUNTER — Encounter: Payer: Self-pay | Admitting: Family Medicine

## 2017-03-09 ENCOUNTER — Ambulatory Visit (INDEPENDENT_AMBULATORY_CARE_PROVIDER_SITE_OTHER): Payer: Medicare Other | Admitting: Family Medicine

## 2017-03-09 VITALS — BP 154/86 | HR 79 | Temp 97.7°F | Resp 18 | Ht 62.5 in | Wt 179.0 lb

## 2017-03-09 DIAGNOSIS — F418 Other specified anxiety disorders: Secondary | ICD-10-CM | POA: Diagnosis not present

## 2017-03-09 DIAGNOSIS — E7800 Pure hypercholesterolemia, unspecified: Secondary | ICD-10-CM | POA: Diagnosis not present

## 2017-03-09 DIAGNOSIS — E669 Obesity, unspecified: Secondary | ICD-10-CM | POA: Diagnosis not present

## 2017-03-09 DIAGNOSIS — Z23 Encounter for immunization: Secondary | ICD-10-CM | POA: Diagnosis not present

## 2017-03-09 DIAGNOSIS — Z124 Encounter for screening for malignant neoplasm of cervix: Secondary | ICD-10-CM | POA: Diagnosis not present

## 2017-03-09 DIAGNOSIS — Z0000 Encounter for general adult medical examination without abnormal findings: Secondary | ICD-10-CM

## 2017-03-09 DIAGNOSIS — M810 Age-related osteoporosis without current pathological fracture: Secondary | ICD-10-CM

## 2017-03-09 DIAGNOSIS — I10 Essential (primary) hypertension: Secondary | ICD-10-CM | POA: Diagnosis not present

## 2017-03-09 DIAGNOSIS — M170 Bilateral primary osteoarthritis of knee: Secondary | ICD-10-CM | POA: Diagnosis not present

## 2017-03-09 DIAGNOSIS — Z720 Tobacco use: Secondary | ICD-10-CM

## 2017-03-09 DIAGNOSIS — R311 Benign essential microscopic hematuria: Secondary | ICD-10-CM | POA: Diagnosis not present

## 2017-03-09 MED ORDER — ALPRAZOLAM 0.25 MG PO TABS: ORAL_TABLET | ORAL | 1 refills | Status: DC

## 2017-03-09 MED ORDER — PRAVASTATIN SODIUM 80 MG PO TABS: 80.0000 mg | ORAL_TABLET | Freq: Every day | ORAL | 3 refills | Status: DC

## 2017-03-09 MED ORDER — BENAZEPRIL HCL 20 MG PO TABS: 20.0000 mg | ORAL_TABLET | Freq: Every day | ORAL | 3 refills | Status: DC

## 2017-03-09 MED ORDER — ALENDRONATE SODIUM 70 MG PO TABS: 70.0000 mg | ORAL_TABLET | ORAL | 3 refills | Status: DC

## 2017-03-09 MED ORDER — BUPROPION HCL ER (XL) 300 MG PO TB24: 300.0000 mg | ORAL_TABLET | Freq: Every day | ORAL | 3 refills | Status: DC

## 2017-03-09 MED ORDER — ZOSTER VAC RECOMB ADJUVANTED 50 MCG/0.5ML IM SUSR: 0.5000 mL | Freq: Once | INTRAMUSCULAR | 1 refills | Status: AC

## 2017-03-09 NOTE — Patient Instructions (Addendum)
Preventive Care 65 Years and Older, Female Preventive care refers to lifestyle choices and visits with your health care provider that can promote health and wellness. What does preventive care include?  A yearly physical exam. This is also called an annual well check.  Dental exams once or twice a year.  Routine eye exams. Ask your health care provider how often you should have your eyes checked.  Personal lifestyle choices, including:  Daily care of your teeth and gums.  Regular physical activity.  Eating a healthy diet.  Avoiding tobacco and drug use.  Limiting alcohol use.  Practicing safe sex.  Taking low-dose aspirin every day.  Taking vitamin and mineral supplements as recommended by your health care provider. What happens during an annual well check? The services and screenings done by your health care provider during your annual well check will depend on your age, overall health, lifestyle risk factors, and family history of disease. Counseling  Your health care provider may ask you questions about your:  Alcohol use.  Tobacco use.  Drug use.  Emotional well-being.  Home and relationship well-being.  Sexual activity.  Eating habits.  History of falls.  Memory and ability to understand (cognition).  Work and work environment.  Reproductive health. Screening  You may have the following tests or measurements:  Height, weight, and BMI.  Blood pressure.  Lipid and cholesterol levels. These may be checked every 5 years, or more frequently if you are over 50 years old.  Skin check.  Lung cancer screening. You may have this screening every year starting at age 55 if you have a 30-pack-year history of smoking and currently smoke or have quit within the past 15 years.  Fecal occult blood test (FOBT) of the stool. You may have this test every year starting at age 50.  Flexible sigmoidoscopy or colonoscopy. You may have a sigmoidoscopy every 5 years or  a colonoscopy every 10 years starting at age 50.  Hepatitis C blood test.  Hepatitis B blood test.  Sexually transmitted disease (STD) testing.  Diabetes screening. This is done by checking your blood sugar (glucose) after you have not eaten for a while (fasting). You may have this done every 1-3 years.  Bone density scan. This is done to screen for osteoporosis. You may have this done starting at age 65.  Mammogram. This may be done every 1-2 years. Talk to your health care provider about how often you should have regular mammograms. Talk with your health care provider about your test results, treatment options, and if necessary, the need for more tests. Vaccines  Your health care provider may recommend certain vaccines, such as:  Influenza vaccine. This is recommended every year.  Tetanus, diphtheria, and acellular pertussis (Tdap, Td) vaccine. You may need a Td booster every 10 years.  Varicella vaccine. You may need this if you have not been vaccinated.  Zoster vaccine. You may need this after age 60.  Measles, mumps, and rubella (MMR) vaccine. You may need at least one dose of MMR if you were born in 1957 or later. You may also need a second dose.  Pneumococcal 13-valent conjugate (PCV13) vaccine. One dose is recommended after age 65.  Pneumococcal polysaccharide (PPSV23) vaccine. One dose is recommended after age 65.  Meningococcal vaccine. You may need this if you have certain conditions.  Hepatitis A vaccine. You may need this if you have certain conditions or if you travel or work in places where you may be exposed to   hepatitis A.  Hepatitis B vaccine. You may need this if you have certain conditions or if you travel or work in places where you may be exposed to hepatitis B.  Haemophilus influenzae type b (Hib) vaccine. You may need this if you have certain conditions.  Talk to your health care provider about which screenings and vaccines you need and how often you  need them. This information is not intended to replace advice given to you by your health care provider. Make sure you discuss any questions you have with your health care provider. Document Released: 12/06/2015 Document Revised: 07/29/2016 Document Reviewed: 09/10/2015 Elsevier Interactive Patient Education  2017 [employer]    IF you received an x-ray today, you will receive an invoice from [facility]. Please contact [facility] at [phone] with questions or concerns regarding your invoice.   IF you received labwork today, you will receive an invoice from [facility]. Please contact [facility] at [phone] with questions or concerns regarding your invoice.   Our billing staff will not be able to assist you with questions regarding bills from these companies.  You will be contacted with the lab results as soon as they are available. The fastest way to get your results is to activate your My Chart account. Instructions are located on the last page of this paperwork. If you have not heard from us regarding the results in 2 weeks, please contact this office.      

## 2017-03-09 NOTE — Progress Notes (Signed)
Subjective:    Patient ID: Nicole Peters, female    DOB: 1951-05-21, 66 y.o.   MRN: [id number]  03/09/2017  Annual Exam (6 month )   HPI This 66 y.o. female presents for Annual Wellness Examination Initial and follow-up of chronic medical conditions.  Last physical: 03-03-2016 Pap smear:  02-05-14 WNL; HPV negative. Mammogram:  08-2015 WNL Colonoscopy:   2009; diverticulosis Bone density: 08-2016 Eye exam: +glasses; once per  Year.; no glaucoma; +mild cataracts. Dental exam:  Every 3-4 months; peridontist  Immunization History  Administered Date(s) Administered  . Influenza Split 11/23/2010, 10/15/2012, 01/30/2017  . Influenza-Unspecified 08/08/2015  . Pneumococcal Conjugate-13 03/03/2016  . Pneumococcal Polysaccharide-23 11/23/2009  . Td 11/23/2004  . Tdap 08/12/2015  . Zoster 04/10/2015   BP Readings from Last 3 Encounters:  03/09/17 (!) 154/86  09/08/16 (!) 158/90  03/17/16 (!) 167/80   Wt Readings from Last 3 Encounters:  03/09/17 179 lb (81.2 kg)  10/22/16 176 lb (79.8 kg)  09/08/16 176 lb (79.8 kg)   HTN: Patient reports good compliance with medication, good tolerance to medication, and good symptom control.   Home Bp running 99/53 pulse 63.  This morning 129/79.  Hypercholesterolemia: Patient reports good compliance with medication, good tolerance to medication, and good symptom control.    Allergic Rhinitis: worsening; Patient reports good compliance with medication, good tolerance to medication, and good symptom control.    Anxiety and depression: Patient reports good compliance with medication, good tolerance to medication, and good symptom control.  Taking one every morning and 1/2 at night; sometimes will switch it.   Tobacco abuse: QUIT SMOKING 12/12/16.  Review of Systems  Constitutional: Negative for activity change, appetite change, chills, diaphoresis, fatigue, fever and unexpected weight change.  HENT: Negative for congestion, dental problem,  drooling, ear discharge, ear pain, facial swelling, hearing loss, mouth sores, nosebleeds, postnasal drip, rhinorrhea, sinus pressure, sneezing, sore throat, tinnitus, trouble swallowing and voice change.   Eyes: Negative for photophobia, pain, discharge, redness, itching and visual disturbance.  Respiratory: Negative for apnea, cough, choking, chest tightness, shortness of breath, wheezing and stridor.   Cardiovascular: Negative for chest pain, palpitations and leg swelling.  Gastrointestinal: Negative for abdominal distention, abdominal pain, anal bleeding, blood in stool, constipation, diarrhea, nausea, rectal pain and vomiting.  Endocrine: Negative for cold intolerance, heat intolerance, polydipsia, polyphagia and polyuria.  Genitourinary: Negative for decreased urine volume, difficulty urinating, dyspareunia, dysuria, enuresis, flank pain, frequency, genital sores, hematuria, menstrual problem, pelvic pain, urgency, vaginal bleeding, vaginal discharge and vaginal pain.  Musculoskeletal: Negative for arthralgias, back pain, gait problem, joint swelling, myalgias, neck pain and neck stiffness.  Skin: Negative for color change, pallor, rash and wound.  Allergic/Immunologic: Negative for environmental allergies, food allergies and immunocompromised state.  Neurological: Negative for dizziness, tremors, seizures, syncope, facial asymmetry, speech difficulty, weakness, light-headedness, numbness and headaches.  Hematological: Negative for adenopathy. Does not bruise/bleed easily.  Psychiatric/Behavioral: Negative for agitation, behavioral problems, confusion, decreased concentration, dysphoric mood, hallucinations, self-injury, sleep disturbance and suicidal ideas. The patient is not nervous/anxious and is not hyperactive.     Past Medical History:  Diagnosis Date  . Anxiety   . Arthritis   . Depression   . History of bronchitis   . Hyperlipidemia   . Hypertension   . Osteoporosis   . Stress  incontinence    Past Surgical History:  Procedure Laterality Date  . APPENDECTOMY    . Arthroscopic knee surgery     Both knees- Dr.  Graves  . CHOLECYSTECTOMY    . COLONOSCOPY  12/22/2007   Diverticulosis. [facility]. Repeat in 10 years.  . INJECTION KNEE Left 03/16/2016   Procedure: LEFT KNEE CORTISONE INJECTION. ;  Surgeon: Dorna Leitz, MD;  Location: [facility];  Service: Orthopedics;  Laterality: Left;  . KNEE SURGERY Bilateral    arthroscopic  . TONSILLECTOMY    . TOTAL KNEE ARTHROPLASTY Right 03/16/2016   Procedure: RIGHT TOTAL KNEE ARTHROPLASTY ( LATERAL APPROACH) & LEFT KNEE CORTISONE INJECTION. ;  Surgeon: Dorna Leitz, MD;  Location: [facility];  Service: Orthopedics;  Laterality: Right;  . TUBAL LIGATION     No Known Allergies  Social History   Social History  . Marital status: Married    Spouse name: Merry Proud  . Number of children: 1  . Years of education: N/A   Occupational History  . retired     cleaned house   Social History Main Topics  . Smoking status: Former Smoker    Packs/day: 0.25    Years: 40.00    Types: Cigarettes    Quit date: 12/12/2016  . Smokeless tobacco: Former Systems developer    Quit date: 12/12/2016     Comment: "smoke about 3 cigarettes a day"  . Alcohol use No  . Drug use: No  . Sexual activity: Yes    Birth control/ protection: Post-menopausal   Other Topics Concern  . Not on file   Social History Narrative   Marital status: married x 32 years; second husband ;happily; no abuse.      Children: one child/son in [place] [employer] [place] (73) in [place]; one grandchild (11yo)      Lives: with husband.      Employment:  Homemaker/retired.  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      Tobacco: quit in 11/2016;  5 cigarettes per day.  Smoking since age 51.       Alcohol:  None      Drugs: none      Exercise: no exercise due to R oasteoarthritis knee.       Seatbelt:  100%l bi textubg,       Guns:  Loaded; secured.      ADLs: indepdendent with ADLs.     Advanced  Directives: none; desires FULL CODE but no prolonged measures.   Family History  Problem Relation Age of Onset  . Cancer Father     stomach cancer with liver mets  . Heart disease Father     angina; no AMI/CAD;no CABG  . Heart disease Brother 50    mild heart attack/AMI in 1997       Objective:    BP (!) 154/86   Pulse 79   Temp 97.7 F (36.5 C) (Oral)   Resp 18   Ht 5' 2.5" (1.588 m)   Wt 179 lb (81.2 kg)   SpO2 93%   BMI 32.22 kg/m  Physical Exam  Constitutional: She is oriented to person, place, and time. She appears well-developed and well-nourished. No distress.  HENT:  Head: Normocephalic and atraumatic.  Right Ear: External ear normal.  Left Ear: External ear normal.  Nose: Nose normal.  Mouth/Throat: Oropharynx is clear and moist.  Eyes: Conjunctivae and EOM are normal. Pupils are equal, round, and reactive to light.  Neck: Normal range of motion and full passive range of motion without pain. Neck supple. No JVD present. Carotid bruit is not present. No thyromegaly present.  Cardiovascular: Normal rate, regular rhythm and normal heart sounds.  Exam reveals no  gallop and no friction rub.   No murmur heard. Pulmonary/Chest: Effort normal and breath sounds normal. She has no wheezes. She has no rales. Right breast exhibits no inverted nipple, no mass, no nipple discharge, no skin change and no tenderness. Left breast exhibits no mass, no nipple discharge, no skin change and no tenderness. Breasts are symmetrical.  Abdominal: Soft. Bowel sounds are normal. She exhibits no distension and no mass. There is no tenderness. There is no rebound and no guarding.  Genitourinary: Vagina normal and uterus normal. There is no rash, tenderness, lesion or injury on the right labia. There is no rash, tenderness, lesion or injury on the left labia. Cervix exhibits no motion tenderness, no discharge and no friability. Right adnexum displays no mass, no tenderness and no fullness. Left  adnexum displays no mass, no tenderness and no fullness.  Musculoskeletal:       Right shoulder: Normal.       Left shoulder: Normal.       Cervical back: Normal.  Lymphadenopathy:    She has no cervical adenopathy.  Neurological: She is alert and oriented to person, place, and time. She has normal reflexes. No cranial nerve deficit. She exhibits normal muscle tone. Coordination normal.  Skin: Skin is warm and dry. No rash noted. She is not diaphoretic. No erythema. No pallor.  Psychiatric: She has a normal mood and affect. Her behavior is normal. Judgment and thought content normal.  Nursing note and vitals reviewed.   Depression screen [facility] 2/9 03/09/2017 10/22/2016 03/03/2016 05/17/2015 02/11/2015  Decreased Interest 0 0 0 0 0  Down, Depressed, Hopeless 0 0 0 0 0  PHQ - 2 Score 0 0 0 0 0   Fall Risk  03/09/2017 10/22/2016 03/03/2016 02/11/2015 08/13/2014  Falls in the past year? No No No No Yes  Number falls in past yr: - - - - 1  Injury with Fall? - - - - Yes   Functional Status Survey: Is the patient deaf or have difficulty hearing?: No Does the patient have difficulty seeing, even when wearing glasses/contacts?: No Does the patient have difficulty concentrating, remembering, or making decisions?: No Does the patient have difficulty walking or climbing stairs?: No Does the patient have difficulty dressing or bathing?: No Does the patient have difficulty doing errands alone such as visiting a doctor's office or shopping?: No      Assessment & Plan:   1. Medicare annual wellness visit, initial   2. Essential hypertension   3. Primary osteoarthritis of both knees   4. Age-related osteoporosis without current pathological fracture   5. Depression with anxiety   6. Pure hypercholesterolemia   7. Benign essential microscopic hematuria   8. Tobacco user   9. Obesity (BMI 30.0-34.9)   10. Need for Zostavax administration   11. Cervical cancer screening    -anticipatory guidance --  exercise, weight loss, calcium 3 servings daily, ASA 81mg  daily. -pap smear obtained.  -obtain age appropriate screening labs; also obtain chronic disease management labs. -independent with ADLs; no evidence of hearing loss; low to moderate risk for falling due to OA, obesity, and benzo use. -rx for Shingrix provided. -refills provided. -congratulations on smoking cessation!!!!   Orders Placed This Encounter  Procedures  . CBC with Differential/Platelet  . Comprehensive metabolic panel    Order Specific Question:   Has the patient fasted?    Answer:   Yes  . Lipid panel    Order Specific Question:   Has the patient  fasted?    Answer:   Yes  . POCT urinalysis dipstick   Meds ordered this encounter  Medications  . Zoster Vac Recomb Adjuvanted [facility]) injection    Sig: Inject 0.5 mLs into the muscle once.    Dispense:  0.5 mL    Refill:  1  . alendronate (FOSAMAX) 70 MG tablet    Sig: Take 1 tablet (70 mg total) by mouth every 7 (seven) days. Take with a full glass of water on an empty stomach.    Dispense:  12 tablet    Refill:  3  . ALPRAZolam (XANAX) 0.25 MG tablet    Sig: TAKE 1 TO 1& 1/2 TABLETS BY MOUTH DAILY    Dispense:  135 tablet    Refill:  1  . benazepril (LOTENSIN) 20 MG tablet    Sig: Take 1 tablet (20 mg total) by mouth daily.    Dispense:  90 tablet    Refill:  3  . buPROPion (WELLBUTRIN XL) 300 MG 24 hr tablet    Sig: Take 1 tablet (300 mg total) by mouth daily.    Dispense:  90 tablet    Refill:  3  . pravastatin (PRAVACHOL) 80 MG tablet    Sig: Take 1 tablet (80 mg total) by mouth at bedtime.    Dispense:  90 tablet    Refill:  3    Return in about 6 months (around 09/08/2017) for recheck high blood pressure, high cholesterl, anxiety.   Uday Jantz Elayne Guerin, M.D. Primary Care at [facility] [address] [place], [place]  [address] [phone] phone [phone] fax

## 2017-03-10 ENCOUNTER — Encounter: Payer: Self-pay | Admitting: Family Medicine

## 2017-03-10 LAB — LIPID PANEL
CHOL/HDL RATIO: 2.3 ratio (ref 0.0–4.4)
Cholesterol, Total: 189 mg/dL (ref 100–199)
HDL: 83 mg/dL (ref >39)
LDL Calculated: 86 mg/dL (ref 0–99)
TRIGLYCERIDES: 101 mg/dL (ref 0–149)
VLDL Cholesterol Cal: 20 mg/dL (ref 5–40)

## 2017-03-10 LAB — PAP IG W/ RFLX HPV ASCU: PAP Smear Comment: 0

## 2017-03-10 LAB — CBC WITH DIFFERENTIAL/PLATELET
BASOS: 1 %
Basophils Absolute: 0 10*3/uL (ref 0.0–0.2)
EOS (ABSOLUTE): 0.2 10*3/uL (ref 0.0–0.4)
EOS: 3 %
HEMATOCRIT: 43.7 % (ref 34.0–46.6)
Hemoglobin: 14.2 g/dL (ref 11.1–15.9)
IMMATURE GRANULOCYTES: 0 %
Immature Grans (Abs): 0 10*3/uL (ref 0.0–0.1)
LYMPHS ABS: 2.1 10*3/uL (ref 0.7–3.1)
Lymphs: 32 %
MCH: 30 pg (ref 26.6–33.0)
MCHC: 32.5 g/dL (ref 31.5–35.7)
MCV: 92 fL (ref 79–97)
MONOS ABS: 0.6 10*3/uL (ref 0.1–0.9)
Monocytes: 9 %
NEUTROS PCT: 55 %
Neutrophils Absolute: 3.6 10*3/uL (ref 1.4–7.0)
Platelets: 197 10*3/uL (ref 150–379)
RBC: 4.73 x10E6/uL (ref 3.77–5.28)
RDW: 13.9 % (ref 12.3–15.4)
WBC: 6.6 10*3/uL (ref 3.4–10.8)

## 2017-03-10 LAB — COMPREHENSIVE METABOLIC PANEL
A/G RATIO: 1.6 (ref 1.2–2.2)
ALBUMIN: 4.1 g/dL (ref 3.6–4.8)
ALT: 9 IU/L (ref 0–32)
AST: 19 IU/L (ref 0–40)
Alkaline Phosphatase: 59 IU/L (ref 39–117)
BUN / CREAT RATIO: 14 (ref 12–28)
BUN: 13 mg/dL (ref 8–27)
Bilirubin Total: 0.5 mg/dL (ref 0.0–1.2)
CALCIUM: 9.7 mg/dL (ref 8.7–10.3)
CO2: 25 mmol/L (ref 18–29)
Chloride: 98 mmol/L (ref 96–106)
Creatinine, Ser: 0.96 mg/dL (ref 0.57–1.00)
GFR, EST AFRICAN AMERICAN: 71 mL/min/{1.73_m2} (ref >59)
GFR, EST NON AFRICAN AMERICAN: 62 mL/min/{1.73_m2} (ref >59)
GLOBULIN, TOTAL: 2.6 g/dL (ref 1.5–4.5)
Glucose: 87 mg/dL (ref 65–99)
POTASSIUM: 4.8 mmol/L (ref 3.5–5.2)
SODIUM: 137 mmol/L (ref 134–144)
Total Protein: 6.7 g/dL (ref 6.0–8.5)

## 2017-03-22 DIAGNOSIS — Z9889 Other specified postprocedural states: Secondary | ICD-10-CM | POA: Diagnosis not present

## 2017-05-04 DIAGNOSIS — M25561 Pain in right knee: Secondary | ICD-10-CM | POA: Diagnosis not present

## 2017-05-04 DIAGNOSIS — M1712 Unilateral primary osteoarthritis, left knee: Secondary | ICD-10-CM | POA: Diagnosis not present

## 2017-06-01 DIAGNOSIS — M1712 Unilateral primary osteoarthritis, left knee: Secondary | ICD-10-CM | POA: Diagnosis not present

## 2017-06-08 DIAGNOSIS — M1712 Unilateral primary osteoarthritis, left knee: Secondary | ICD-10-CM | POA: Diagnosis not present

## 2017-06-15 DIAGNOSIS — M1712 Unilateral primary osteoarthritis, left knee: Secondary | ICD-10-CM | POA: Diagnosis not present

## 2017-07-21 DIAGNOSIS — Z23 Encounter for immunization: Secondary | ICD-10-CM | POA: Diagnosis not present

## 2017-07-28 ENCOUNTER — Encounter: Payer: Self-pay | Admitting: Family Medicine

## 2017-07-30 ENCOUNTER — Encounter: Payer: Self-pay | Admitting: Family Medicine

## 2017-08-05 ENCOUNTER — Telehealth: Payer: Self-pay | Admitting: Family Medicine

## 2017-08-05 NOTE — Telephone Encounter (Signed)
Smith - Pt brought by proof that she had her immunizations.  I made a copy and put them in your box.   See mychart message.

## 2017-08-06 IMAGING — CR DG CHEST 2V
2 series · 2 of 2 positions shown · non-contrast
Comparison: None

CLINICAL DATA: Proper evaluation for RIGHT total knee arthroplasty,
history hypertension, smoking, hyperlipidemia

EXAM:
CHEST  2 VIEW

[w chest pa]
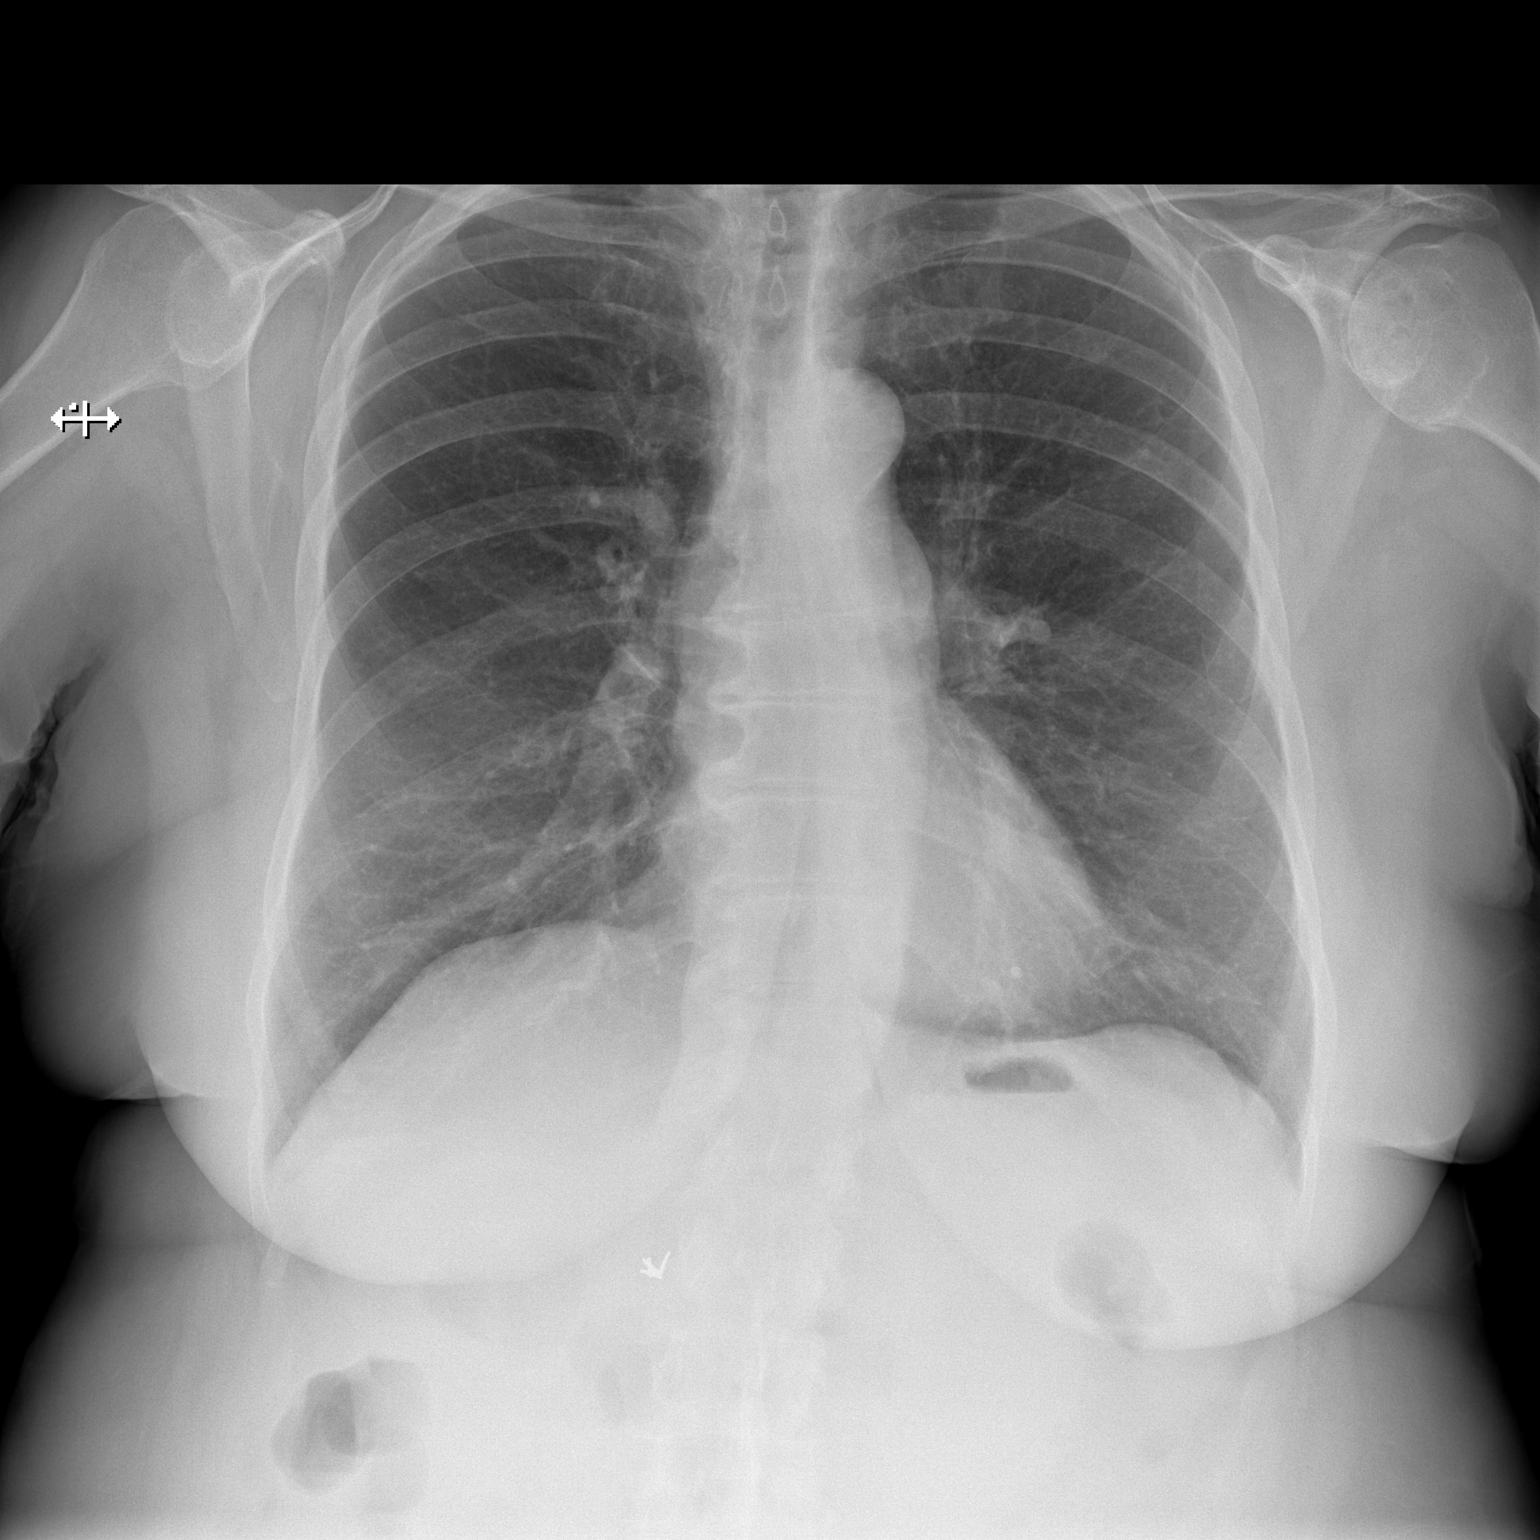

[w chest lat]
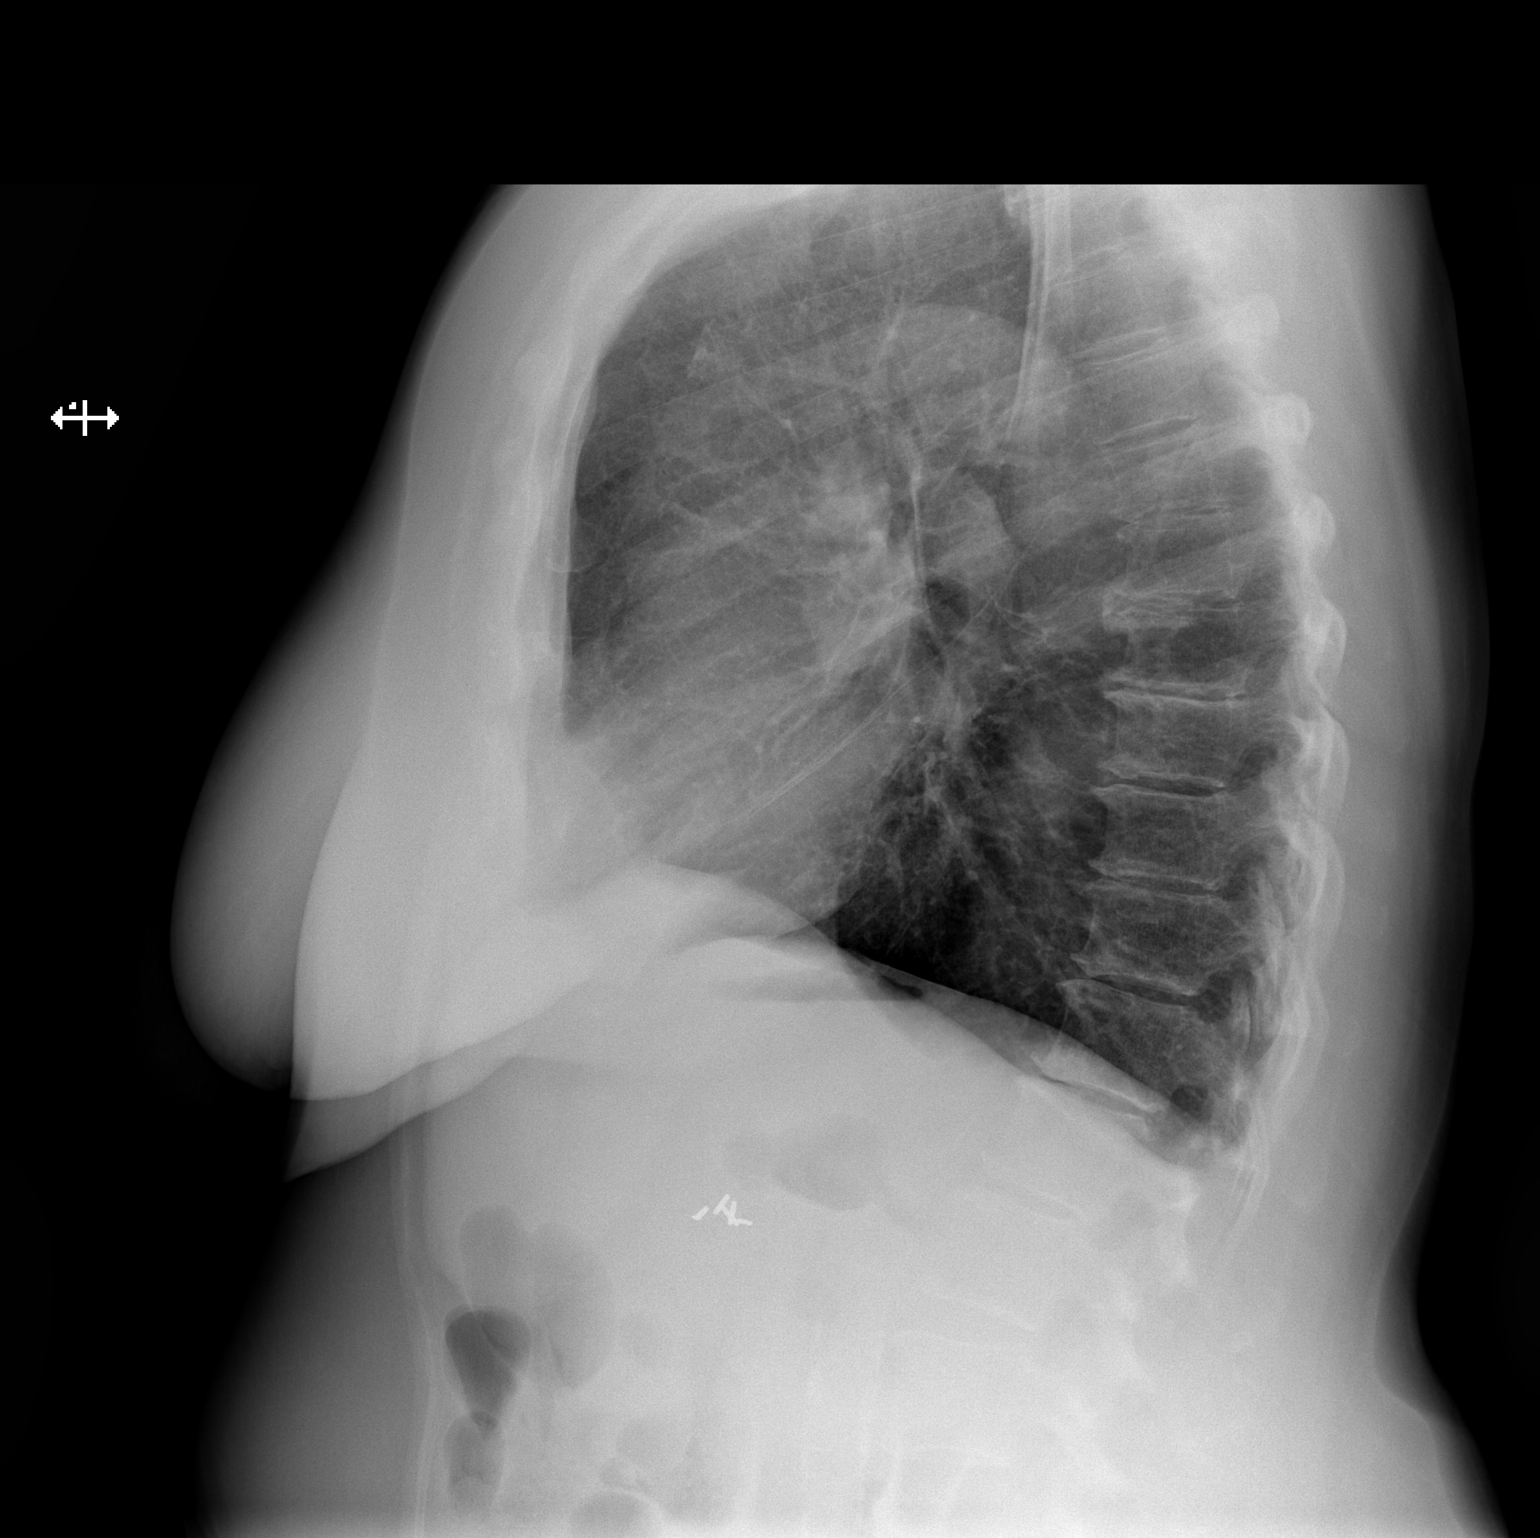

[2 of 2 positions shown; findings below may reference images not displayed]

FINDINGS: Normal heart size, mediastinal contours, and pulmonary vascularity.

Lungs clear.

No pleural effusion or pneumothorax.

Multilevel endplate spur formation thoracic spine.

LEFT glenohumeral degenerative changes.
IMPRESSION: No acute abnormalities.

## 2017-08-12 ENCOUNTER — Encounter: Payer: Self-pay | Admitting: Family Medicine

## 2017-08-24 DIAGNOSIS — Z1231 Encounter for screening mammogram for malignant neoplasm of breast: Secondary | ICD-10-CM | POA: Diagnosis not present

## 2017-09-07 ENCOUNTER — Ambulatory Visit: Payer: Medicare Other | Admitting: Family Medicine

## 2017-09-08 ENCOUNTER — Encounter: Payer: Self-pay | Admitting: Family Medicine

## 2017-09-08 ENCOUNTER — Ambulatory Visit (INDEPENDENT_AMBULATORY_CARE_PROVIDER_SITE_OTHER): Payer: Medicare Other | Admitting: Family Medicine

## 2017-09-08 VITALS — BP 152/82 | HR 100 | Temp 98.0°F | Resp 15 | Ht 62.21 in | Wt 188.0 lb

## 2017-09-08 DIAGNOSIS — Z1231 Encounter for screening mammogram for malignant neoplasm of breast: Secondary | ICD-10-CM | POA: Diagnosis not present

## 2017-09-08 DIAGNOSIS — E7800 Pure hypercholesterolemia, unspecified: Secondary | ICD-10-CM

## 2017-09-08 DIAGNOSIS — F418 Other specified anxiety disorders: Secondary | ICD-10-CM | POA: Diagnosis not present

## 2017-09-08 DIAGNOSIS — E7439 Other disorders of intestinal carbohydrate absorption: Secondary | ICD-10-CM

## 2017-09-08 DIAGNOSIS — M810 Age-related osteoporosis without current pathological fracture: Secondary | ICD-10-CM

## 2017-09-08 DIAGNOSIS — I10 Essential (primary) hypertension: Secondary | ICD-10-CM

## 2017-09-08 LAB — COMPREHENSIVE METABOLIC PANEL
ALT: 14 IU/L (ref 0–32)
AST: 20 IU/L (ref 0–40)
Albumin/Globulin Ratio: 1.6 (ref 1.2–2.2)
Albumin: 4.4 g/dL (ref 3.6–4.8)
Alkaline Phosphatase: 51 IU/L (ref 39–117)
BUN / CREAT RATIO: 14 (ref 12–28)
BUN: 14 mg/dL (ref 8–27)
Bilirubin Total: 0.5 mg/dL (ref 0.0–1.2)
CALCIUM: 9.3 mg/dL (ref 8.7–10.3)
CHLORIDE: 101 mmol/L (ref 96–106)
CO2: 23 mmol/L (ref 20–29)
Creatinine, Ser: 0.98 mg/dL (ref 0.57–1.00)
GFR calc Af Amer: 70 mL/min/{1.73_m2} (ref >59)
GFR, EST NON AFRICAN AMERICAN: 60 mL/min/{1.73_m2} (ref >59)
GLUCOSE: 89 mg/dL (ref 65–99)
Globulin, Total: 2.8 g/dL (ref 1.5–4.5)
Potassium: 4.6 mmol/L (ref 3.5–5.2)
SODIUM: 137 mmol/L (ref 134–144)
TOTAL PROTEIN: 7.2 g/dL (ref 6.0–8.5)

## 2017-09-08 LAB — CBC WITH DIFFERENTIAL/PLATELET
BASOS ABS: 0 10*3/uL (ref 0.0–0.2)
BASOS: 0 %
EOS (ABSOLUTE): 0.1 10*3/uL (ref 0.0–0.4)
Eos: 2 %
Hematocrit: 44.2 % (ref 34.0–46.6)
Hemoglobin: 14.7 g/dL (ref 11.1–15.9)
IMMATURE GRANS (ABS): 0 10*3/uL (ref 0.0–0.1)
Immature Granulocytes: 0 %
LYMPHS: 28 %
Lymphocytes Absolute: 2.1 10*3/uL (ref 0.7–3.1)
MCH: 30.3 pg (ref 26.6–33.0)
MCHC: 33.3 g/dL (ref 31.5–35.7)
MCV: 91 fL (ref 79–97)
Monocytes Absolute: 0.7 10*3/uL (ref 0.1–0.9)
Monocytes: 10 %
Neutrophils Absolute: 4.5 10*3/uL (ref 1.4–7.0)
Neutrophils: 60 %
PLATELETS: 201 10*3/uL (ref 150–379)
RBC: 4.85 x10E6/uL (ref 3.77–5.28)
RDW: 13.1 % (ref 12.3–15.4)
WBC: 7.5 10*3/uL (ref 3.4–10.8)

## 2017-09-08 LAB — LIPID PANEL
CHOLESTEROL TOTAL: 182 mg/dL (ref 100–199)
Chol/HDL Ratio: 2.1 ratio (ref 0.0–4.4)
HDL: 88 mg/dL (ref >39)
LDL CALC: 74 mg/dL (ref 0–99)
TRIGLYCERIDES: 102 mg/dL (ref 0–149)
VLDL Cholesterol Cal: 20 mg/dL (ref 5–40)

## 2017-09-08 MED ORDER — ALPRAZOLAM 0.25 MG PO TABS: ORAL_TABLET | ORAL | 1 refills | Status: DC

## 2017-09-08 NOTE — Patient Instructions (Addendum)
   IF you received an x-ray today, you will receive an invoice from [facility]. Please contact [facility] at [phone] with questions or concerns regarding your invoice.   IF you received labwork today, you will receive an invoice from [facility]. Please contact [facility] at [phone] with questions or concerns regarding your invoice.   Our billing staff will not be able to assist you with questions regarding bills from these companies.  You will be contacted with the lab results as soon as they are available. The fastest way to get your results is to activate your My Chart account. Instructions are located on the last page of this paperwork. If you have not heard from us regarding the results in 2 weeks, please contact this office.     Stress and Stress Management Stress is a normal reaction to life events. It is what you feel when life demands more than you are used to or more than you can handle. Some stress can be useful. For example, the stress reaction can help you catch the last bus of the day, study for a test, or meet a deadline at work. But stress that occurs too often or for too long can cause problems. It can affect your emotional health and interfere with relationships and normal daily activities. Too much stress can weaken your immune system and increase your risk for physical illness. If you already have a medical problem, stress can make it worse. What are the causes? All sorts of life events may cause stress. An event that causes stress for one person may not be stressful for another person. Major life events commonly cause stress. These may be positive or negative. Examples include losing your job, moving into a new home, getting married, having a baby, or losing a loved one. Less obvious life events may also cause stress, especially if they occur day after day or in combination. Examples include working long hours, driving in traffic, caring for children,  being in debt, or being in a difficult relationship. What are the signs or symptoms? Stress may cause emotional symptoms including, the following:  Anxiety. This is feeling worried, afraid, on edge, overwhelmed, or out of control.  Anger. This is feeling irritated or impatient.  Depression. This is feeling sad, down, helpless, or guilty.  Difficulty focusing, remembering, or making decisions. Stress may cause physical symptoms, including the following:  Aches and pains. These may affect your head, neck, back, stomach, or other areas of your body.  Tight muscles or clenched jaw.  Low energy or trouble sleeping. Stress may cause unhealthy behaviors, including the following:  Eating to feel better (overeating) or skipping meals.  Sleeping too little, too much, or both.  Working too much or putting off tasks (procrastination).  Smoking, drinking alcohol, or using drugs to feel better. How is this diagnosed? Stress is diagnosed through an assessment by your health care provider. Your health care provider will ask questions about your symptoms and any stressful life events.Your health care provider will also ask about your medical history and may order blood tests or other tests. Certain medical conditions and medicine can cause physical symptoms similar to stress. Mental illness can cause emotional symptoms and unhealthy behaviors similar to stress. Your health care provider may refer you to a mental health professional for further evaluation. How is this treated? Stress management is the recommended treatment for stress.The goals of stress management are reducing stressful life events and coping with stress in healthy ways. Techniques   for reducing stressful life events include the following:  Stress identification. Self-monitor for stress and identify what causes stress for you. These skills may help you to avoid some stressful events.  Time management. Set your priorities, keep a  calendar of events, and learn to say "no." These tools can help you avoid making too many commitments. Techniques for coping with stress include the following:  Rethinking the problem. Try to think realistically about stressful events rather than ignoring them or overreacting. Try to find the positives in a stressful situation rather than focusing on the negatives.  Exercise. Physical exercise can release both physical and emotional tension. The key is to find a form of exercise you enjoy and do it regularly.  Relaxation techniques. These relax the body and mind. Examples include yoga, meditation, tai chi, biofeedback, deep breathing, progressive muscle relaxation, listening to music, being out in nature, journaling, and other hobbies. Again, the key is to find one or more that you enjoy and can do regularly.  Healthy lifestyle. Eat a balanced diet, get plenty of sleep, and do not smoke. Avoid using alcohol or drugs to relax.  Strong support network. Spend time with family, friends, or other people you enjoy being around.Express your feelings and talk things over with someone you trust. Counseling or talktherapy with a mental health professional may be helpful if you are having difficulty managing stress on your own. Medicine is typically not recommended for the treatment of stress.Talk to your health care provider if you think you need medicine for symptoms of stress. Follow these instructions at home:  Keep all follow-up visits as directed by your health care provider.  Take all medicines as directed by your health care provider. Contact a health care provider if:  Your symptoms get worse or you start having new symptoms.  You feel overwhelmed by your problems and can no longer manage them on your own. Get help right away if:  You feel like hurting yourself or someone else. This information is not intended to replace advice given to you by your health care provider. Make sure you  discuss any questions you have with your health care provider. Document Released: 05/05/2001 Document Revised: 04/16/2016 Document Reviewed: 07/04/2013 Elsevier Interactive Patient Education  2017 [employer]  

## 2017-09-08 NOTE — Progress Notes (Signed)
Subjective:    Patient ID: Nicole Peters, female    DOB: Sep 30, 1951, 66 y.o.   MRN: [id number]  09/08/2017  Hypertension (6 month follow-up); Hyperlipidemia; and Depression (refill on Xanax )    HPI This 66 y.o. female presents for six month follow-up of hypertension, hypercholesterolemia, and anxiety/depression.  No changes to management made at last visit. All labs normal; pap smear WNL.  Patient had quit smoking at last visit. Start Fosamax weekly for osteoporosis.  Stressed horribly for three months.  Son's father passed away of pancreatic cancer.  Then, husband found his brother dead on 08/04/2017 at age 29.  CAD.  Mother in law is 87 years old and must deal with son's issue; big mess everywhere.  Family all lives together.  Brother-in-law lived in house for free.    07/19/17 Dental exam; WNL. 07/21/17 received flu vaccine, Pneumovax 23, Shingrix. 08/19/17 Eye exam; WNL 08/24/17 Mammogram at [facility].  3D mammogram.  Blood pressure at home 139/77 which is higher; sometimes 150s/80s with major stressors.   Gets along with mother in law only because of husband.  MIL is very selfish.   Goes to different church than mother in [employer].   Drinking pepsi to avoid cigarettes; quit nine months ago.  Has gained nine pounds in six months.  1.5 Xanax daily.  Cannot decrease right now.  Walking 3 days per week; walks for 10-15 minutes.  BP Readings from Last 3 Encounters:  09/08/17 (!) 160/88  03/09/17 (!) 154/86  09/08/16 (!) 158/90   Wt Readings from Last 3 Encounters:  09/08/17 188 lb (85.3 kg)  03/09/17 179 lb (81.2 kg)  10/22/16 176 lb (79.8 kg)   Immunization History  Administered Date(s) Administered  . Influenza Split 11/23/2010, 10/15/2012, 01/30/2017  . Influenza-Unspecified 08/08/2015, 07/21/2017  . Pneumococcal Conjugate-13 03/03/2016  . Pneumococcal Polysaccharide-23 11/23/2009, 07/21/2017  . Td 11/23/2004  . Tdap 08/12/2015  . Zoster 04/10/2015  . Zoster Recombinat  (Shingrix) 07/21/2017    Review of Systems  Constitutional: Negative for chills, diaphoresis, fatigue and fever.  Eyes: Negative for visual disturbance.  Respiratory: Negative for cough and shortness of breath.   Cardiovascular: Negative for chest pain, palpitations and leg swelling.  Gastrointestinal: Negative for abdominal pain, constipation, diarrhea, nausea and vomiting.  Endocrine: Negative for cold intolerance, heat intolerance, polydipsia, polyphagia and polyuria.  Neurological: Negative for dizziness, tremors, seizures, syncope, facial asymmetry, speech difficulty, weakness, light-headedness, numbness and headaches.  Psychiatric/Behavioral: Negative for dysphoric mood, self-injury and sleep disturbance. The patient is nervous/anxious.     Past Medical History:  Diagnosis Date  . Anxiety   . Arthritis   . Depression   . History of bronchitis   . Hyperlipidemia   . Hypertension   . Osteoporosis   . Stress incontinence    Past Surgical History:  Procedure Laterality Date  . APPENDECTOMY    . Arthroscopic knee surgery     Both knees- Dr. Berenice Primas  . CHOLECYSTECTOMY    . COLONOSCOPY  12/22/2007   Diverticulosis. [facility]. Repeat in 10 years.  . INJECTION KNEE Left 03/16/2016   Procedure: LEFT KNEE CORTISONE INJECTION. ;  Surgeon: Dorna Leitz, MD;  Location: [facility];  Service: Orthopedics;  Laterality: Left;  . KNEE SURGERY Bilateral    arthroscopic  . TONSILLECTOMY    . TOTAL KNEE ARTHROPLASTY Right 03/16/2016   Procedure: RIGHT TOTAL KNEE ARTHROPLASTY ( LATERAL APPROACH) & LEFT KNEE CORTISONE INJECTION. ;  Surgeon: Dorna Leitz, MD;  Location: [facility]  OR;  Service: Orthopedics;  Laterality: Right;  . TUBAL LIGATION     No Known Allergies Current Outpatient Prescriptions on File Prior to Visit  Medication Sig Dispense Refill  . alendronate (FOSAMAX) 70 MG tablet Take 1 tablet (70 mg total) by mouth every 7 (seven) days. Take with a full glass of water on an empty  stomach. 12 tablet 3  . aspirin 75 MG chewable tablet Chew 75 mg by mouth daily.    . benazepril (LOTENSIN) 20 MG tablet Take 1 tablet (20 mg total) by mouth daily. 90 tablet 3  . buPROPion (WELLBUTRIN XL) 300 MG 24 hr tablet Take 1 tablet (300 mg total) by mouth daily. 90 tablet 3  . cetirizine (ZYRTEC) 10 MG tablet Take 10 mg by mouth daily as needed for allergies.     . Cholecalciferol (VITAMIN D) 2000 units tablet Take 2,000 Units by mouth daily.    . Multiple Vitamin (MULTIVITAMIN) tablet Take 1 tablet by mouth daily.    . pravastatin (PRAVACHOL) 80 MG tablet Take 1 tablet (80 mg total) by mouth at bedtime. 90 tablet 3  . tiZANidine (ZANAFLEX) 2 MG tablet Take 1 tablet (2 mg total) by mouth every 8 (eight) hours as needed for muscle spasms. 50 tablet 0   No current facility-administered medications on file prior to visit.    Social History   Social History  . Marital status: Married    Spouse name: Merry Proud  . Number of children: 1  . Years of education: N/A   Occupational History  . retired     cleaned house   Social History Main Topics  . Smoking status: Former Smoker    Packs/day: 0.25    Years: 40.00    Types: Cigarettes    Quit date: 12/12/2016  . Smokeless tobacco: Former Systems developer    Quit date: 12/12/2016     Comment: "smoke about 3 cigarettes a day"  . Alcohol use No  . Drug use: No  . Sexual activity: Yes    Birth control/ protection: Post-menopausal   Other Topics Concern  . Not on file   Social History Narrative   Marital status: married x 32 years; second husband ;happily; no abuse.      Children: one child/son in [place] (47) in [place]; one grandchild (11yo)      Lives: with husband.      Employment:  Homemaker/retired.  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      Tobacco: quit in 11/2016;  5 cigarettes per day.  Smoking since age 63.       Alcohol:  None      Drugs: none      Exercise: no exercise due to R oasteoarthritis knee.       Seatbelt:  100%l bi  textubg,       Guns:  Loaded; secured.      ADLs: indepdendent with ADLs.     Advanced Directives: none; desires FULL CODE but no prolonged measures.   Family History  Problem Relation Age of Onset  . Cancer Father        stomach cancer with liver mets  . Heart disease Father        angina; no AMI/CAD;no CABG  . Heart disease Brother 50       mild heart attack/AMI in 1997       Objective:    BP (!) 160/88   Pulse 100   Temp 98 F (36.7 C) (Oral)   Resp 15  Ht 5' 2.21" (1.58 m)   Wt 188 lb (85.3 kg)   SpO2 94%   BMI 34.16 kg/m  Physical Exam  Constitutional: She is oriented to person, place, and time. She appears well-developed and well-nourished. No distress.  HENT:  Head: Normocephalic and atraumatic.  Right Ear: External ear normal.  Left Ear: External ear normal.  Nose: Nose normal.  Mouth/Throat: Oropharynx is clear and moist.  Eyes: Pupils are equal, round, and reactive to light. Conjunctivae and EOM are normal.  Neck: Normal range of motion. Neck supple. Carotid bruit is not present. No thyromegaly present.  Cardiovascular: Normal rate, regular rhythm, normal heart sounds and intact distal pulses.  Exam reveals no gallop and no friction rub.   No murmur heard. Pulmonary/Chest: Effort normal and breath sounds normal. She has no wheezes. She has no rales.  Abdominal: Soft. Bowel sounds are normal. She exhibits no distension and no mass. There is no tenderness. There is no rebound and no guarding.  Lymphadenopathy:    She has no cervical adenopathy.  Neurological: She is alert and oriented to person, place, and time. No cranial nerve deficit.  Skin: Skin is warm and dry. No rash noted. She is not diaphoretic. No erythema. No pallor.  Psychiatric: She has a normal mood and affect. Her behavior is normal.   No results found. Depression screen [facility] 2/9 09/08/2017 03/09/2017 10/22/2016 03/03/2016 05/17/2015  Decreased Interest 0 0 0 0 0  Down, Depressed, Hopeless 0 0 0  0 0  PHQ - 2 Score 0 0 0 0 0   Fall Risk  09/08/2017 03/09/2017 10/22/2016 03/03/2016 02/11/2015  Falls in the past year? No No No No No  Number falls in past yr: - - - - -  Injury with [place]:   1. Essential hypertension   2. Depression with anxiety   3. Pure hypercholesterolemia   4. Age-related osteoporosis without current pathological fracture   5. Screening mammogram, encounter for   6. Glucose intolerance    -hypertension, hypercholesterolemia, osteoporosis controlled; obtain labs; refills provided. -increase in family stressors in the past six months; refill of Xanax provided.  -obtain labs for chronic disease management. --recommend weight loss, exercise for 30-60 minutes five days per week; recommend 1200 kcal restriction per day with a minimum of 60 grams of protein per day. -recommend exercise for stress management.   Orders Placed This Encounter  Procedures  . CBC with Differential/Platelet  . Comprehensive metabolic panel    Order Specific Question:   Has the patient fasted?    Answer:   No  . Lipid panel    Order Specific Question:   Has the patient fasted?    Answer:   No  . Care order/instruction:    Please recheck BP.   Meds ordered this encounter  Medications  . ALPRAZolam (XANAX) 0.25 MG tablet    Sig: TAKE 1 TO 1& 1/2 TABLETS BY MOUTH DAILY    Dispense:  135 tablet    Refill:  1    Return in about 6 months (around 03/09/2018) for complete physical examiniation.   Kylie Gros Elayne Guerin, M.D. Primary Care at [facility] previously [facility] [address] [place], [place]  [address] [phone] phone [phone] fax

## 2017-12-29 ENCOUNTER — Encounter: Payer: Self-pay | Admitting: Gastroenterology

## 2017-12-31 ENCOUNTER — Encounter: Payer: Self-pay | Admitting: Gastroenterology

## 2018-01-12 ENCOUNTER — Encounter: Payer: Self-pay | Admitting: Family Medicine

## 2018-01-20 ENCOUNTER — Encounter: Payer: Self-pay | Admitting: Family Medicine

## 2018-02-03 ENCOUNTER — Other Ambulatory Visit: Payer: Self-pay

## 2018-02-03 ENCOUNTER — Ambulatory Visit (AMBULATORY_SURGERY_CENTER): Payer: Self-pay

## 2018-02-03 VITALS — Ht 62.5 in | Wt 198.6 lb

## 2018-02-03 DIAGNOSIS — Z1211 Encounter for screening for malignant neoplasm of colon: Secondary | ICD-10-CM

## 2018-02-03 NOTE — Progress Notes (Signed)
No egg or soy allergy known to patient  No issues with past sedation with any surgeries  or procedures, no intubation problems  No diet pills per patient No home 02 use per patient  No blood thinners per patient  Pt denies issues with constipation  No A fib or A flutter  EMMI video sent to pt's e mail  

## 2018-02-14 ENCOUNTER — Telehealth: Payer: Self-pay | Admitting: Gastroenterology

## 2018-02-14 NOTE — Telephone Encounter (Signed)
Pt states she has been doing the dulcolax stool softener and she has severe stomach cramps and diarrhea- she cant eat due to this- she was instructed to stop these, use the mag citrate and miralax prep as instructed and call with further questions  Lelan Pons PV

## 2018-02-17 ENCOUNTER — Ambulatory Visit (AMBULATORY_SURGERY_CENTER): Payer: Medicare Other | Admitting: Gastroenterology

## 2018-02-17 ENCOUNTER — Other Ambulatory Visit: Payer: Self-pay

## 2018-02-17 ENCOUNTER — Encounter: Payer: Self-pay | Admitting: Gastroenterology

## 2018-02-17 VITALS — BP 107/68 | HR 74 | Temp 97.1°F | Resp 25 | Ht 62.2 in | Wt 188.0 lb

## 2018-02-17 DIAGNOSIS — Z1211 Encounter for screening for malignant neoplasm of colon: Secondary | ICD-10-CM | POA: Diagnosis not present

## 2018-02-17 DIAGNOSIS — Z1212 Encounter for screening for malignant neoplasm of rectum: Secondary | ICD-10-CM | POA: Diagnosis not present

## 2018-02-17 MED ORDER — SODIUM CHLORIDE 0.9 % IV SOLN: 500.0000 mL | Freq: Once | INTRAVENOUS | Status: DC

## 2018-02-17 NOTE — Progress Notes (Signed)
To PACU, VSS. Report to [email or web address] 

## 2018-02-17 NOTE — Patient Instructions (Signed)
YOU HAD AN ENDOSCOPIC PROCEDURE TODAY AT THE [facility]:   Refer to the procedure report that was given to you for any specific questions about what was found during the examination.  If the procedure report does not answer your questions, please call your gastroenterologist to clarify.  If you requested that your care partner not be given the details of your procedure findings, then the procedure report has been included in a sealed envelope for you to review at your convenience later.  YOU SHOULD EXPECT: Some feelings of bloating in the abdomen. Passage of more gas than usual.  Walking can help get rid of the air that was put into your GI tract during the procedure and reduce the bloating. If you had a lower endoscopy (such as a colonoscopy or flexible sigmoidoscopy) you may notice spotting of blood in your stool or on the toilet paper. If you underwent a bowel prep for your procedure, you may not have a normal bowel movement for a few days.  Please Note:  You might notice some irritation and congestion in your nose or some drainage.  This is from the oxygen used during your procedure.  There is no need for concern and it should clear up in a day or so.  SYMPTOMS TO REPORT IMMEDIATELY:   Following lower endoscopy (colonoscopy or flexible sigmoidoscopy):  Excessive amounts of blood in the stool  Significant tenderness or worsening of abdominal pains  Swelling of the abdomen that is new, acute  Fever of 100F or higher   For urgent or emergent issues, a gastroenterologist can be reached at any hour by calling [phone].   DIET:  We do recommend a small meal at first, but then you may proceed to your regular diet.  Drink plenty of fluids but you should avoid alcoholic beverages for 24 hours.  ACTIVITY:  You should plan to take it easy for the rest of today and you should NOT DRIVE or use heavy machinery until tomorrow (because of the sedation medicines used during the test).     FOLLOW UP: Our staff will call the number listed on your records the next business day following your procedure to check on you and address any questions or concerns that you may have regarding the information given to you following your procedure. If we do not reach you, we will leave a message.  However, if you are feeling well and you are not experiencing any problems, there is no need to return our call.  We will assume that you have returned to your regular daily activities without incident.  If any biopsies were taken you will be contacted by phone or by letter within the next 1-3 weeks.  Please call us at [phone] if you have not heard about the biopsies in 3 weeks.    SIGNATURES/CONFIDENTIALITY: You and/or your care partner have signed paperwork which will be entered into your electronic medical record.  These signatures attest to the fact that that the information above on your After Visit Summary has been reviewed and is understood.  Full responsibility of the confidentiality of this discharge information lies with you and/or your care-partner.   Thank you for allowing us to provide your healthcare today.  

## 2018-02-17 NOTE — Progress Notes (Signed)
Pt's states no medical or surgical changes since previsit or office visit. 

## 2018-02-17 NOTE — Op Note (Addendum)
[facility] Patient Name: Nicole Peters Procedure Date: 02/17/2018 8:37 AM MRN: [id number] Endoscopist: Mauri Pole , MD Age: 67 Referring MD:  Date of Birth: 1951-09-19 Gender: Female Account #: [id number] Procedure:                Colonoscopy Indications:              Screening for colorectal malignant neoplasm, Last                            colonoscopy: 2009 Medicines:                Monitored Anesthesia Care Procedure:                Pre-Anesthesia Assessment:                           - Prior to the procedure, a History and Physical                            was performed, and patient medications and                            allergies were reviewed. The patient's tolerance of                            previous anesthesia was also reviewed. The risks                            and benefits of the procedure and the sedation                            options and risks were discussed with the patient.                            All questions were answered, and informed consent                            was obtained. Prior Anticoagulants: The patient has                            taken no previous anticoagulant or antiplatelet                            agents. ASA Grade Assessment: II - A patient with                            mild systemic disease. After reviewing the risks                            and benefits, the patient was deemed in                            satisfactory condition to undergo the procedure.  After obtaining informed consent, the colonoscope                            was passed under direct vision. Throughout the                            procedure, the patient's blood pressure, pulse, and                            oxygen saturations were monitored continuously. The                            Colonoscope was introduced through the anus and                            advanced to the the cecum, identified  by                            appendiceal orifice and ileocecal valve. The                            colonoscopy was performed without difficulty. The                            patient tolerated the procedure well. The quality                            of the bowel preparation was excellent. The                            ileocecal valve, appendiceal orifice, and rectum                            were photographed. Scope In: 8:49:39 AM Scope Out: 9:07:03 AM Scope Withdrawal Time: 0 hours 10 minutes 36 seconds  Total Procedure Duration: 0 hours 17 minutes 24 seconds  Findings:                 The perianal and digital rectal examinations were                            normal.                           Multiple small and large-mouthed diverticula were                            found in the sigmoid colon and descending colon.                           Non-bleeding internal hemorrhoids were found during                            retroflexion. The hemorrhoids were moderate. Complications:            No immediate complications. Estimated Blood  Loss:     Estimated blood loss was minimal. Impression:               - Moderate diverticulosis in the sigmoid colon and                            in the descending colon.                           - Non-bleeding internal hemorrhoids.                           - No specimens collected. Recommendation:           - Patient has a contact number available for                            emergencies. The signs and symptoms of potential                            delayed complications were discussed with the                            patient. Return to normal activities tomorrow.                            Written discharge instructions were provided to the                            patient.                           - Resume previous diet.                           - Continue present medications.                           - Repeat colonoscopy in 10  years for screening                            purposes. Mauri Pole, MD 02/17/2018 9:12:56 AM This report has been signed electronically.

## 2018-02-18 ENCOUNTER — Telehealth: Payer: Self-pay | Admitting: *Deleted

## 2018-02-18 NOTE — Telephone Encounter (Signed)
  Follow up Call-  Call back number 02/17/2018  Post procedure Call Back phone  # [phone]  Permission to leave phone message Yes  Some recent data might be hidden     Patient questions:  Do you have a fever, pain , or abdominal swelling? No. Pain Score  0 *  Have you tolerated food without any problems? Yes.    Have you been able to return to your normal activities? Yes.    Do you have any questions about your discharge instructions: Diet   No. Medications  No. Follow up visit  No.  Do you have questions or concerns about your Care? No.  Actions: * If pain score is 4 or above: No action needed, pain <4.

## 2018-03-07 ENCOUNTER — Telehealth: Payer: Medicare Other | Admitting: Family

## 2018-03-07 DIAGNOSIS — J029 Acute pharyngitis, unspecified: Secondary | ICD-10-CM

## 2018-03-07 MED ORDER — BENZONATATE 100 MG PO CAPS: 100.0000 mg | ORAL_CAPSULE | Freq: Three times a day (TID) | ORAL | 0 refills | Status: DC | PRN

## 2018-03-07 NOTE — Progress Notes (Signed)
Thank you for the details you included in the comment boxes. Those details are very helpful in determining the best course of treatment for you and help [place] to provide the best care.  We are sorry that you are not feeling well.  Here is how we plan to help!  Based on your presentation I believe you most likely have A cough due to a virus which is not the flu.  This is called viral bronchitis and is best treated by rest, plenty of fluids and control of the cough.  You may use Ibuprofen or Tylenol as directed to help your symptoms.     In addition you may use A non-prescription cough medication called Mucinex DM: take 2 tablets every 12 hours. and A prescription cough medication called Tessalon Perles 100mg . You may take 1-2 capsules every 8 hours as needed for your cough.    From your responses in the eVisit questionnaire you describe inflammation in the upper respiratory tract which is causing a significant cough.  This is commonly called Bronchitis and has four common causes:    Allergies  Viral Infections  Acid Reflux  Bacterial Infection Allergies, viruses and acid reflux are treated by controlling symptoms or eliminating the cause. An example might be a cough caused by taking certain blood pressure medications. You stop the cough by changing the medication. Another example might be a cough caused by acid reflux. Controlling the reflux helps control the cough.  USE OF BRONCHODILATOR ("RESCUE") INHALERS: There is a risk from using your bronchodilator too frequently.  The risk is that over-reliance on a medication which only relaxes the muscles surrounding the breathing tubes can reduce the effectiveness of medications prescribed to reduce swelling and congestion of the tubes themselves.  Although you feel brief relief from the bronchodilator inhaler, your asthma may actually be worsening with the tubes becoming more swollen and filled with mucus.  This can delay other crucial treatments, such  as oral steroid medications. If you need to use a bronchodilator inhaler daily, several times per day, you should discuss this with your provider.  There are probably better treatments that could be used to keep your asthma under control.     HOME CARE . Only take medications as instructed by your medical team. . Complete the entire course of an antibiotic. . Drink plenty of fluids and get plenty of rest. . Avoid close contacts especially the very young and the elderly . Cover your mouth if you cough or cough into your sleeve. . Always remember to wash your hands . A steam or ultrasonic humidifier can help congestion.   GET HELP RIGHT AWAY IF: . You develop worsening fever. . You become short of breath . You cough up blood. . Your symptoms persist after you have completed your treatment plan MAKE SURE YOU   Understand these instructions.  Will watch your condition.  Will get help right away if you are not doing well or get worse.  Your e-visit answers were reviewed by a board certified advanced clinical practitioner to complete your personal care plan.  Depending on the condition, your plan could have included both over the counter or prescription medications. If there is a problem please reply  once you have received a response from your provider. Your safety is important to [place].  If you have drug allergies check your prescription carefully.    You can use MyChart to ask questions about today's visit, request a non-urgent call back, or ask for  a work or school excuse for 24 hours related to this e-Visit. If it has been greater than 24 hours you will need to follow up with your provider, or enter a new e-Visit to address those concerns. You will get an e-mail in the next two days asking about your experience.  I hope that your e-visit has been valuable and will speed your recovery. Thank you for using e-visits.

## 2018-03-09 ENCOUNTER — Encounter: Payer: Medicare Other | Admitting: Family Medicine

## 2018-03-14 ENCOUNTER — Encounter: Payer: Self-pay | Admitting: Family Medicine

## 2018-03-14 ENCOUNTER — Other Ambulatory Visit: Payer: Self-pay

## 2018-03-14 ENCOUNTER — Ambulatory Visit (INDEPENDENT_AMBULATORY_CARE_PROVIDER_SITE_OTHER): Payer: Medicare Other | Admitting: Family Medicine

## 2018-03-14 VITALS — BP 160/88 | HR 90 | Temp 98.0°F | Resp 16 | Ht 62.21 in | Wt 193.0 lb

## 2018-03-14 DIAGNOSIS — F418 Other specified anxiety disorders: Secondary | ICD-10-CM | POA: Diagnosis not present

## 2018-03-14 DIAGNOSIS — Z0000 Encounter for general adult medical examination without abnormal findings: Secondary | ICD-10-CM | POA: Diagnosis not present

## 2018-03-14 DIAGNOSIS — R739 Hyperglycemia, unspecified: Secondary | ICD-10-CM | POA: Diagnosis not present

## 2018-03-14 DIAGNOSIS — I10 Essential (primary) hypertension: Secondary | ICD-10-CM

## 2018-03-14 DIAGNOSIS — Z6835 Body mass index (BMI) 35.0-35.9, adult: Secondary | ICD-10-CM | POA: Diagnosis not present

## 2018-03-14 DIAGNOSIS — M170 Bilateral primary osteoarthritis of knee: Secondary | ICD-10-CM | POA: Diagnosis not present

## 2018-03-14 DIAGNOSIS — M810 Age-related osteoporosis without current pathological fracture: Secondary | ICD-10-CM | POA: Diagnosis not present

## 2018-03-14 DIAGNOSIS — E7800 Pure hypercholesterolemia, unspecified: Secondary | ICD-10-CM

## 2018-03-14 LAB — POCT URINALYSIS DIP (MANUAL ENTRY)
Bilirubin, UA: NEGATIVE
GLUCOSE UA: NEGATIVE mg/dL
Ketones, POC UA: NEGATIVE mg/dL
LEUKOCYTES UA: NEGATIVE
Nitrite, UA: NEGATIVE
Protein Ur, POC: NEGATIVE mg/dL
SPEC GRAV UA: 1.02 (ref 1.010–1.025)
Urobilinogen, UA: 0.2 E.U./dL
pH, UA: 6.5 (ref 5.0–8.0)

## 2018-03-14 MED ORDER — PRAVASTATIN SODIUM 80 MG PO TABS: 80.0000 mg | ORAL_TABLET | Freq: Every day | ORAL | 3 refills | Status: DC

## 2018-03-14 MED ORDER — BUPROPION HCL ER (XL) 300 MG PO TB24: 300.0000 mg | ORAL_TABLET | Freq: Every day | ORAL | 3 refills | Status: DC

## 2018-03-14 MED ORDER — BENAZEPRIL HCL 20 MG PO TABS: 20.0000 mg | ORAL_TABLET | Freq: Every day | ORAL | 3 refills | Status: DC

## 2018-03-14 MED ORDER — ALPRAZOLAM 0.25 MG PO TABS: ORAL_TABLET | ORAL | 1 refills | Status: DC

## 2018-03-14 MED ORDER — ALENDRONATE SODIUM 70 MG PO TABS: 70.0000 mg | ORAL_TABLET | ORAL | 3 refills | Status: DC

## 2018-03-14 NOTE — Progress Notes (Signed)
Subjective:    Patient ID: Nicole Peters, female    DOB: 1951-05-27, 67 y.o.   MRN: [id number]  03/14/2018  Medicare Wellness    HPI This 67 y.o. female presents for Nicole Peters and follow-up of chronic medical conditions. Management changes made last visit include the following: -hypertension, hypercholesterolemia, osteoporosis controlled; obtain labs; refills provided. -increase in family stressors in the past six months; refill of Xanax provided.  -obtain labs for chronic disease management. --recommend weight loss, exercise for 30-60 minutes five days per week; recommend 1200 kcal restriction per day with a minimum of 60 grams of protein per day. -recommend exercise for stress management  Eye exam 08-19-2017 Dental exam:  10-05-2017   Visual Acuity Screening   Right eye Left eye Both eyes  Without correction:     With correction: 20/20 20/20 20/20     BP Readings from Last 3 Encounters:  03/14/18 (!) 160/88  02/17/18 107/68  09/08/17 (!) 152/82   Wt Readings from Last 3 Encounters:  03/14/18 193 lb (87.5 kg)  02/17/18 188 lb (85.3 kg)  02/03/18 198 lb 9.6 oz (90.1 kg)   Immunization History  Administered Date(s) Administered  . Influenza Split 11/23/2010, 10/15/2012, 01/30/2017  . Influenza, High Dose Seasonal PF 07/21/2017  . Influenza-Unspecified 08/08/2015, 07/21/2017  . Pneumococcal Conjugate-13 03/03/2016  . Pneumococcal Polysaccharide-23 11/23/2009, 07/21/2017  . Td 11/23/2004  . Tdap 08/12/2015  . Zoster 04/10/2015  . Zoster Recombinat (Shingrix) 07/21/2017, 03/05/2018   Health Maintenance  Topic Date Due  . INFLUENZA VACCINE  06/23/2018  . MAMMOGRAM  08/25/2019  . TETANUS/TDAP  08/11/2025  . COLONOSCOPY  02/18/2028  . DEXA SCAN  Completed  . Hepatitis C Screening  Completed  . PNA vac Low Risk Adult  Completed   Anxiety: taking Wellbutrin XL 300mg  daily; Xanax 1.5 daily.  Mother in law is stressful.  HTN: Patient reports good  compliance with medication, good tolerance to medication, and good symptom control.    Hypercholesterolemia: Patient reports good compliance with medication, good tolerance to medication, and good symptom control.    Review of Systems  Constitutional: Negative for activity change, appetite change, chills, diaphoresis, fatigue, fever and unexpected weight change.  HENT: Negative for congestion, dental problem, drooling, ear discharge, ear pain, facial swelling, hearing loss, mouth sores, nosebleeds, postnasal drip, rhinorrhea, sinus pressure, sneezing, sore throat, tinnitus, trouble swallowing and voice change.   Eyes: Negative for photophobia, pain, discharge, redness, itching and visual disturbance.  Respiratory: Negative for apnea, cough, choking, chest tightness, shortness of breath, wheezing and stridor.   Cardiovascular: Negative for chest pain, palpitations and leg swelling.  Gastrointestinal: Negative for abdominal distention, abdominal pain, anal bleeding, blood in stool, constipation, diarrhea, nausea, rectal pain and vomiting.  Endocrine: Negative for cold intolerance, heat intolerance, polydipsia, polyphagia and polyuria.  Genitourinary: Negative for decreased urine volume, difficulty urinating, dyspareunia, dysuria, enuresis, flank pain, frequency, genital sores, hematuria, menstrual problem, pelvic pain, urgency, vaginal bleeding, vaginal discharge and vaginal pain.       Nocturia x 1.  Urinary leakage with coughing.  Musculoskeletal: Positive for arthralgias. Negative for back pain, gait problem, joint swelling, myalgias, neck pain and neck stiffness.  Skin: Negative for color change, pallor, rash and wound.  Allergic/Immunologic: Negative for environmental allergies, food allergies and immunocompromised state.  Neurological: Negative for dizziness, tremors, seizures, syncope, facial asymmetry, speech difficulty, weakness, light-headedness, numbness and headaches.  Hematological:  Negative for adenopathy. Does not bruise/bleed easily.  Psychiatric/Behavioral: Negative for agitation, behavioral problems,  confusion, decreased concentration, dysphoric mood, hallucinations, self-injury, sleep disturbance and suicidal ideas. The patient is nervous/anxious. The patient is not hyperactive.        Bedtime 1130-1200; wakes up 730-800.    Past Medical History:  Diagnosis Date  . Allergy   . Anxiety   . Arthritis   . Depression   . History of bronchitis   . Hyperlipidemia   . Hypertension   . Osteoporosis   . Stress incontinence    Past Surgical History:  Procedure Laterality Date  . APPENDECTOMY    . Arthroscopic knee surgery     Both knees- Dr. Berenice Primas  . CHOLECYSTECTOMY    . COLONOSCOPY  12/22/2007   Diverticulosis. [facility]. Repeat in 10 years.  . INJECTION KNEE Left 03/16/2016   Procedure: LEFT KNEE CORTISONE INJECTION. ;  Surgeon: Dorna Leitz, MD;  Location: [place];  Service: Orthopedics;  Laterality: Left;  . KNEE SURGERY Bilateral    arthroscopic  . TONSILLECTOMY    . TOTAL KNEE ARTHROPLASTY Right 03/16/2016   Procedure: RIGHT TOTAL KNEE ARTHROPLASTY ( LATERAL APPROACH) & LEFT KNEE CORTISONE INJECTION. ;  Surgeon: Dorna Leitz, MD;  Location: [facility];  Service: Orthopedics;  Laterality: Right;  . TUBAL LIGATION     No Known Allergies Current Outpatient Medications on File Prior to Visit  Medication Sig Dispense Refill  . aspirin 75 MG chewable tablet Chew 81 mg by mouth daily.     . cetirizine (ZYRTEC) 10 MG tablet Take 10 mg by mouth daily as needed for allergies.     . Cholecalciferol (VITAMIN D) 2000 units tablet Take 2,000 Units by mouth daily.    Marland Kitchen ibuprofen (ADVIL,MOTRIN) 200 MG tablet Take 400 mg by mouth every 6 (six) hours as needed.    . Multiple Vitamin (MULTIVITAMIN) tablet Take 1 tablet by mouth daily.    Marland Kitchen tiZANidine (ZANAFLEX) 2 MG tablet Take 1 tablet (2 mg total) by mouth every 8 (eight) hours as needed for muscle spasms. 50 tablet  0   Current Facility-Administered Medications on File Prior to Visit  Medication Dose Route Frequency Provider Last Rate Last Dose  . 0.9 %  sodium chloride infusion  500 mL Intravenous Once Mauri Pole, MD       Social History   Socioeconomic History  . Marital status: Married    Spouse name: Nicole Peters  . Number of children: 1  . Years of education: Not on file  . Highest education level: Not on file  Occupational History  . Occupation: retired    Comment: cleaned house  Social Needs  . Financial resource strain: Not on file  . Food insecurity:    Worry: Not on file    Inability: Not on file  . Transportation needs:    Medical: Not on file    Non-medical: Not on file  Tobacco Use  . Smoking status: Former Smoker    Packs/day: 0.25    Years: 40.00    Pack years: 10.00    Types: Cigarettes    Last attempt to quit: 12/12/2016    Years since quitting: 1.4  . Smokeless tobacco: Never Used  . Tobacco comment: quit smoking 2018  Substance and Sexual Activity  . Alcohol use: No    Alcohol/week: 0.0 oz  . Drug use: No  . Sexual activity: Yes    Birth control/protection: Post-menopausal  Lifestyle  . Physical activity:    Days per week: Not on file    Minutes per session: Not  on file  . Stress: Not on file  Relationships  . Social connections:    Talks on phone: Not on file    Gets together: Not on file    Attends religious service: Not on file    Active member of club or organization: Not on file    Attends meetings of clubs or organizations: Not on file    Relationship status: Not on file  . Intimate partner violence:    Fear of current or ex partner: Not on file    Emotionally abused: Not on file    Physically abused: Not on file    Forced sexual activity: Not on file  Other Topics Concern  . Not on file  Social History Narrative   Marital status: married x 33 years; second husband ;happily; no abuse.      Children: one child/son in [place] (46) in [place]; one  grandchild (12yo)      Lives: with husband.      Employment:  Homemaker/retired.  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      Tobacco: quit in 11/2016;  5 cigarettes per day.  Smoking since age 73.       Alcohol:  None      Drugs: none      Exercise: no exercise due to R oasteoarthritis knee.       Seatbelt:  100%.       Guns:  Loaded; secured.      ADLs: indepdendent with ADLs.     Advanced Directives: none; desires FULL CODE but no prolonged measures.   Family History  Problem Relation Age of Onset  . Cancer Father        stomach cancer with liver mets  . Heart disease Father        angina; no AMI/CAD;no CABG  . Stomach cancer Father   . Heart disease Brother 50       mild heart attack/AMI in 1997  . Colon cancer Neg Hx   . Colon polyps Neg Hx   . Esophageal cancer Neg Hx   . Rectal cancer Neg Hx        Objective:    BP (!) 160/88   Pulse 90   Temp 98 F (36.7 C) (Oral)   Resp 16   Ht 5' 2.21" (1.58 m)   Wt 193 lb (87.5 kg)   SpO2 93%   BMI 35.07 kg/m  Physical Exam  Constitutional: She is oriented to person, place, and time. She appears well-developed and well-nourished. No distress.  HENT:  Head: Normocephalic and atraumatic.  Right Ear: External ear normal.  Left Ear: External ear normal.  Nose: Nose normal.  Mouth/Throat: Oropharynx is clear and moist.  Eyes: Pupils are equal, round, and reactive to light. Conjunctivae and EOM are normal.  Neck: Normal range of motion and full passive range of motion without pain. Neck supple. No JVD present. Carotid bruit is not present. No thyromegaly present.  Cardiovascular: Normal rate, regular rhythm and normal heart sounds. Exam reveals no gallop and no friction rub.  No murmur heard. Pulmonary/Chest: Effort normal and breath sounds normal. She has no wheezes. She has no rales.  Abdominal: Soft. Bowel sounds are normal. She exhibits no distension and no mass. There is no tenderness. There is no rebound and no  guarding.  Musculoskeletal:       Right shoulder: Normal.       Left shoulder: Normal.       Cervical back: Normal.  Lymphadenopathy:  She has no cervical adenopathy.  Neurological: She is alert and oriented to person, place, and time. She has normal reflexes. No cranial nerve deficit. She exhibits normal muscle tone. Coordination normal.  Skin: Skin is warm and dry. No rash noted. She is not diaphoretic. No erythema. No pallor.  Psychiatric: She has a normal mood and affect. Her behavior is normal. Judgment and thought content normal.  Nursing note and vitals reviewed.  No results found. Depression screen [place] 2/9 09/08/2017 03/09/2017 10/22/2016 03/03/2016 05/17/2015  Decreased Interest 0 0 0 0 0  Down, Depressed, Hopeless 0 0 0 0 0  PHQ - 2 Score 0 0 0 0 0   Fall Risk  09/08/2017 03/09/2017 10/22/2016 03/03/2016 02/11/2015  Falls in the past year? No No No No No  Number falls in past yr: - - - - -  Injury with Fall? - - - - -  Comment - - - - -   Functional Status Survey: Is the patient deaf or have difficulty hearing?: No Does the patient have difficulty seeing, even when wearing glasses/contacts?: No Does the patient have difficulty concentrating, remembering, or making decisions?: No Does the patient have difficulty walking or climbing stairs?: No Does the patient have difficulty dressing or bathing?: No Does the patient have difficulty doing errands alone such as visiting a doctor's office or shopping?: No      Assessment & Plan:   1. Medicare annual wellness visit, subsequent   2. Essential hypertension   3. Depression with anxiety   4. Pure hypercholesterolemia   5. Age-related osteoporosis without current pathological fracture   6. Primary osteoarthritis of both knees   7. Hyperglycemia   8. Class 2 severe obesity due to excess calories with serious comorbidity and body mass index (BMI) of 35.0 to 35.9 in adult [facility])     -anticipatory guidance provided --- exercise,  weight loss, safe driving practices, aspirin 81mg  daily. -obtain age appropriate screening labs and labs for chronic disease management. -moderate fall risk; undergoing treatment for anxiety/ depression; no evidence of hearing loss.  Discussed advanced directives and living will; also discussed end of life issues including code status.  -Osteoporosis: New onset.  Initiate Fosamax therapy.  Also recommend calcium 600 mg twice daily and vitamin D 801,000 international units daily.  Recommend weightbearing exercises daily.  Repeat bone density scan recommended in 2 years. -Generalized anxiety disorder: Moderately controlled with Wellbutrin and Xanax therapy.  Counseled patient regarding the risk of dementia with chronic Xanax use.  Patient expresses understanding. -Hypertension: Moderately controlled at this time.  Elevated reading today yet normal home readings.  Patient with a history of known whitecoat syndrome.  Continue to monitor closely at home.  Obtain labs for chronic disease management.  Refills provided. -HyperCholesterolemia: Controlled.  Obtain labs.  Refills provided. -Obesity:  Recommend weight loss, exercise for 30-60 minutes five days per week; recommend 1200 kcal restriction per day with a minimum of 60 grams of protein per day.  Eat 3 meals per day. Do not skip meals. Consider having a protein shake as a meal replacement to aid with eliminating meal skipping. Look for products with <220 calories, <7 gm sugar, and 20-30 gm protein.  Eat breakfast within 2 hours of getting up.   Make  your plate non-starchy vegetables,  protein, and  carbohydrates at lunch and dinner.   Aim for at least 64 oz. of calorie-free beverages daily (water, Crystal Light, diet green tea, etc.). Eliminate any sugary beverages such as  regular soda, sweet tea, or fruit juice.   Pay attention to hunger and fullness cues.  Stop eating once you feel satisfied; don't wait until you feel full, stuffed, or sick from  eating.  Choose lean meats and low fat/fat free dairy products.  Choose foods high in fiber such as fruits, vegetables, and whole grains (brown rice, whole wheat pasta, whole wheat bread, etc.).  Limit foods with added sugar to <7 gm per serving.  Always eat in the kitchen/dining room.  Never eat in the bedroom or in front of the TV.     Orders Placed This Encounter  Procedures  . CBC with Differential/Platelet  . Comprehensive metabolic panel    Order Specific Question:   Has the patient fasted?    Answer:   No  . Hemoglobin A1c  . Lipid panel    Order Specific Question:   Has the patient fasted?    Answer:   No  . Microalbumin / creatinine urine ratio  . POCT urinalysis dipstick   Meds ordered this encounter  Medications  . alendronate (FOSAMAX) 70 MG tablet    Sig: Take 1 tablet (70 mg total) by mouth every 7 (seven) days. Take with a full glass of water on an empty stomach.    Dispense:  12 tablet    Refill:  3  . ALPRAZolam (XANAX) 0.25 MG tablet    Sig: TAKE 1 TO 1& 1/2 TABLETS BY MOUTH DAILY    Dispense:  135 tablet    Refill:  1  . benazepril (LOTENSIN) 20 MG tablet    Sig: Take 1 tablet (20 mg total) by mouth daily.    Dispense:  90 tablet    Refill:  3  . buPROPion (WELLBUTRIN XL) 300 MG 24 hr tablet    Sig: Take 1 tablet (300 mg total) by mouth daily.    Dispense:  90 tablet    Refill:  3  . pravastatin (PRAVACHOL) 80 MG tablet    Sig: Take 1 tablet (80 mg total) by mouth at bedtime.    Dispense:  90 tablet    Refill:  3    Return in about 6 months (around 09/13/2018) for follow-up chronic medical conditions [facility].   Aneliz Carbary Elayne Guerin, M.D. Primary Care at [facility] [address] [place],   [address] [phone] phone [phone] fax

## 2018-03-14 NOTE — Patient Instructions (Addendum)
   IF you received an x-ray today, you will receive an invoice from [facility]. Please contact [facility] at [phone] with questions or concerns regarding your invoice.   IF you received labwork today, you will receive an invoice from [facility]. Please contact [facility] at [phone] with questions or concerns regarding your invoice.   Our billing staff will not be able to assist you with questions regarding bills from these companies.  You will be contacted with the lab results as soon as they are available. The fastest way to get your results is to activate your My Chart account. Instructions are located on the last page of this paperwork. If you have not heard from us regarding the results in 2 weeks, please contact this office.      Preventive Care 67 Years and Older, Female Preventive care refers to lifestyle choices and visits with your health care provider that can promote health and wellness. What does preventive care include?  A yearly physical exam. This is also called an annual well check.  Dental exams once or twice a year.  Routine eye exams. Ask your health care provider how often you should have your eyes checked.  Personal lifestyle choices, including: ? Daily care of your teeth and gums. ? Regular physical activity. ? Eating a healthy diet. ? Avoiding tobacco and drug use. ? Limiting alcohol use. ? Practicing safe sex. ? Taking low-dose aspirin every day. ? Taking vitamin and mineral supplements as recommended by your health care provider. What happens during an annual well check? The services and screenings done by your health care provider during your annual well check will depend on your age, overall health, lifestyle risk factors, and family history of disease. Counseling Your health care provider may ask you questions about your:  Alcohol use.  Tobacco use.  Drug use.  Emotional well-being.  Home and relationship  well-being.  Sexual activity.  Eating habits.  History of falls.  Memory and ability to understand (cognition).  Work and work environment.  Reproductive health.  Screening You may have the following tests or measurements:  Height, weight, and BMI.  Blood pressure.  Lipid and cholesterol levels. These may be checked every 5 years, or more frequently if you are over 50 years old.  Skin check.  Lung cancer screening. You may have this screening every year starting at age 55 if you have a 30-pack-year history of smoking and currently smoke or have quit within the past 15 years.  Fecal occult blood test (FOBT) of the stool. You may have this test every year starting at age 50.  Flexible sigmoidoscopy or colonoscopy. You may have a sigmoidoscopy every 5 years or a colonoscopy every 10 years starting at age 50.  Hepatitis C blood test.  Hepatitis B blood test.  Sexually transmitted disease (STD) testing.  Diabetes screening. This is done by checking your blood sugar (glucose) after you have not eaten for a while (fasting). You may have this done every 1-3 years.  Bone density scan. This is done to screen for osteoporosis. You may have this done starting at age 65.  Mammogram. This may be done every 1-2 years. Talk to your health care provider about how often you should have regular mammograms.  Talk with your health care provider about your test results, treatment options, and if necessary, the need for more tests. Vaccines Your health care provider may recommend certain vaccines, such as:  Influenza vaccine. This is recommended every year.    Tetanus, diphtheria, and acellular pertussis (Tdap, Td) vaccine. You may need a Td booster every 10 years.  Varicella vaccine. You may need this if you have not been vaccinated.  Zoster vaccine. You may need this after age 60.  Measles, mumps, and rubella (MMR) vaccine. You may need at least one dose of MMR if you were born in  1957 or later. You may also need a second dose.  Pneumococcal 13-valent conjugate (PCV13) vaccine. One dose is recommended after age 65.  Pneumococcal polysaccharide (PPSV23) vaccine. One dose is recommended after age 65.  Meningococcal vaccine. You may need this if you have certain conditions.  Hepatitis A vaccine. You may need this if you have certain conditions or if you travel or work in places where you may be exposed to hepatitis A.  Hepatitis B vaccine. You may need this if you have certain conditions or if you travel or work in places where you may be exposed to hepatitis B.  Haemophilus influenzae type b (Hib) vaccine. You may need this if you have certain conditions.  Talk to your health care provider about which screenings and vaccines you need and how often you need them. This information is not intended to replace advice given to you by your health care provider. Make sure you discuss any questions you have with your health care provider. Document Released: 12/06/2015 Document Revised: 07/29/2016 Document Reviewed: 09/10/2015 Elsevier Interactive Patient Education  2018 [employer]  

## 2018-03-15 LAB — HEMOGLOBIN A1C
ESTIMATED AVERAGE GLUCOSE: 111 mg/dL
HEMOGLOBIN A1C: 5.5 % (ref 4.8–5.6)

## 2018-03-15 LAB — CBC WITH DIFFERENTIAL/PLATELET
BASOS ABS: 0 10*3/uL (ref 0.0–0.2)
Basos: 0 %
EOS (ABSOLUTE): 0.1 10*3/uL (ref 0.0–0.4)
EOS: 2 %
HEMATOCRIT: 44.3 % (ref 34.0–46.6)
Hemoglobin: 14.6 g/dL (ref 11.1–15.9)
IMMATURE GRANULOCYTES: 0 %
Immature Grans (Abs): 0 10*3/uL (ref 0.0–0.1)
LYMPHS: 34 %
Lymphocytes Absolute: 2.4 10*3/uL (ref 0.7–3.1)
MCH: 30.4 pg (ref 26.6–33.0)
MCHC: 33 g/dL (ref 31.5–35.7)
MCV: 92 fL (ref 79–97)
MONOCYTES: 8 %
Monocytes Absolute: 0.6 10*3/uL (ref 0.1–0.9)
NEUTROS PCT: 56 %
Neutrophils Absolute: 4 10*3/uL (ref 1.4–7.0)
Platelets: 222 10*3/uL (ref 150–379)
RBC: 4.81 x10E6/uL (ref 3.77–5.28)
RDW: 13.1 % (ref 12.3–15.4)
WBC: 7.2 10*3/uL (ref 3.4–10.8)

## 2018-03-15 LAB — COMPREHENSIVE METABOLIC PANEL
ALK PHOS: 51 IU/L (ref 39–117)
ALT: 12 IU/L (ref 0–32)
AST: 20 IU/L (ref 0–40)
Albumin/Globulin Ratio: 1.4 (ref 1.2–2.2)
Albumin: 4 g/dL (ref 3.6–4.8)
BUN/Creatinine Ratio: 11 — ABNORMAL LOW (ref 12–28)
BUN: 11 mg/dL (ref 8–27)
Bilirubin Total: 0.5 mg/dL (ref 0.0–1.2)
CALCIUM: 9.2 mg/dL (ref 8.7–10.3)
CO2: 23 mmol/L (ref 20–29)
CREATININE: 0.99 mg/dL (ref 0.57–1.00)
Chloride: 101 mmol/L (ref 96–106)
GFR calc Af Amer: 68 mL/min/{1.73_m2} (ref >59)
GFR calc non Af Amer: 59 mL/min/{1.73_m2} — ABNORMAL LOW (ref >59)
GLUCOSE: 96 mg/dL (ref 65–99)
Globulin, Total: 2.8 g/dL (ref 1.5–4.5)
Potassium: 4.2 mmol/L (ref 3.5–5.2)
Sodium: 141 mmol/L (ref 134–144)
Total Protein: 6.8 g/dL (ref 6.0–8.5)

## 2018-03-15 LAB — MICROALBUMIN / CREATININE URINE RATIO
CREATININE, UR: 93.3 mg/dL
Microalb/Creat Ratio: 8.1 mg/g creat (ref 0.0–30.0)
Microalbumin, Urine: 7.6 ug/mL

## 2018-03-15 LAB — LIPID PANEL
CHOLESTEROL TOTAL: 188 mg/dL (ref 100–199)
Chol/HDL Ratio: 2.8 ratio (ref 0.0–4.4)
HDL: 67 mg/dL (ref >39)
LDL Calculated: 102 mg/dL — ABNORMAL HIGH (ref 0–99)
TRIGLYCERIDES: 97 mg/dL (ref 0–149)
VLDL Cholesterol Cal: 19 mg/dL (ref 5–40)

## 2018-04-08 DIAGNOSIS — M1712 Unilateral primary osteoarthritis, left knee: Secondary | ICD-10-CM | POA: Diagnosis not present

## 2018-04-19 ENCOUNTER — Encounter: Payer: Self-pay | Admitting: Family Medicine

## 2018-05-23 ENCOUNTER — Encounter: Payer: Self-pay | Admitting: Family Medicine

## 2018-05-23 DIAGNOSIS — M1712 Unilateral primary osteoarthritis, left knee: Secondary | ICD-10-CM | POA: Diagnosis not present

## 2018-05-30 ENCOUNTER — Other Ambulatory Visit: Payer: Self-pay | Admitting: Orthopedic Surgery

## 2018-06-16 ENCOUNTER — Encounter (HOSPITAL_COMMUNITY): Payer: Self-pay

## 2018-06-16 ENCOUNTER — Other Ambulatory Visit: Payer: Self-pay

## 2018-06-16 NOTE — Patient Instructions (Signed)
Your procedure is scheduled on: Friday, Jun 24, 2018   Surgery Time:  7:30AM-9:30AM   Report to [facility]  Entrance    Report to admitting at 5:30 AM   Call this number if you have problems the morning of surgery [phone]   Do not eat food or drink liquids :After Midnight.   Do NOT smoke after Midnight   Take these medicines the morning of surgery with A SIP OF WATER: Bupropion, Cetirizine as needed, Xanax as needed                               You may not have any metal on your body including hair pins, jewelry, and body piercings             Do not wear make-up, lotions, powders, perfumes/cologne, or deodorant             Do not wear nail polish.  Do not shave  48 hours prior to surgery.              Do not bring valuables to the hospital. [place].   Contacts, dentures or bridgework may not be worn into surgery.   Leave suitcase in the car. After surgery it may be brought to your room.   Special Instructions: Bring a copy of your healthcare power of attorney and living will documents         the day of surgery if you haven't scanned them in before.              Please read over the following fact sheets you were given:  [facility] - Preparing for Surgery Before surgery, you can play an important role.  Because skin is not sterile, your skin needs to be as free of germs as possible.  You can reduce the number of germs on your skin by washing with CHG (chlorahexidine gluconate) soap before surgery.  CHG is an antiseptic cleaner which kills germs and bonds with the skin to continue killing germs even after washing. Please DO NOT use if you have an allergy to CHG or antibacterial soaps.  If your skin becomes reddened/irritated stop using the CHG and inform your nurse when you arrive at Short Stay. Do not shave (including legs and underarms) for at least 48 hours prior to the first CHG shower.  You may shave  your face/neck.  Please follow these instructions carefully:  1.  Shower with CHG Soap the night before surgery and the  morning of surgery.  2.  If you choose to wash your hair, wash your hair first as usual with your normal  shampoo.  3.  After you shampoo, rinse your hair and body thoroughly to remove the shampoo.                             4.  Use CHG as you would any other liquid soap.  You can apply chg directly to the skin and wash.  Gently with a scrungie or clean washcloth.  5.  Apply the CHG Soap to your body ONLY FROM THE NECK DOWN.   Do   not use on face/ open  Wound or open sores. Avoid contact with eyes, ears mouth and   genitals (private parts).                       Wash face,  Genitals (private parts) with your normal soap.             6.  Wash thoroughly, paying special attention to the area where your    surgery  will be performed.  7.  Thoroughly rinse your body with warm water from the neck down.  8.  DO NOT shower/wash with your normal soap after using and rinsing off the CHG Soap.                9.  Pat yourself dry with a clean towel.            10.  Wear clean pajamas.            11.  Place clean sheets on your bed the night of your first shower and do not  sleep with pets. Day of Surgery : Do not apply any lotions/deodorants the morning of surgery.  Please wear clean clothes to the hospital/surgery [facility].  FAILURE TO FOLLOW THESE INSTRUCTIONS MAY RESULT IN THE CANCELLATION OF YOUR SURGERY  PATIENT SIGNATURE_________________________________  NURSE SIGNATURE__________________________________  ________________________________________________________________________   [facility]  An incentive spirometer is a tool that can help keep your lungs clear and active. This tool measures how well you are filling your lungs with each breath. Taking long deep breaths may help reverse or decrease the chance of developing breathing  (pulmonary) problems (especially infection) following:  A long period of time when you are unable to move or be active. BEFORE THE PROCEDURE   If the spirometer includes an indicator to show your best effort, your nurse or respiratory therapist will set it to a desired goal.  If possible, sit up straight or lean slightly forward. Try not to slouch.  Hold the incentive spirometer in an upright position. INSTRUCTIONS FOR USE  1. Sit on the edge of your bed if possible, or sit up as far as you can in bed or on a chair. 2. Hold the incentive spirometer in an upright position. 3. Breathe out normally. 4. Place the mouthpiece in your mouth and seal your lips tightly around it. 5. Breathe in slowly and as deeply as possible, raising the piston or the ball toward the top of the column. 6. Hold your breath for 3-5 seconds or for as long as possible. Allow the piston or ball to fall to the bottom of the column. 7. Remove the mouthpiece from your mouth and breathe out normally. 8. Rest for a few seconds and repeat Steps 1 through 7 at least 10 times every 1-2 hours when you are awake. Take your time and take a few normal breaths between deep breaths. 9. The spirometer may include an indicator to show your best effort. Use the indicator as a goal to work toward during each repetition. 10. After each set of 10 deep breaths, practice coughing to be sure your lungs are clear. If you have an incision (the cut made at the time of surgery), support your incision when coughing by placing a pillow or rolled up towels firmly against it. Once you are able to get out of bed, walk around indoors and cough well. You may stop using the incentive spirometer when instructed by your caregiver.  RISKS AND COMPLICATIONS  Take your time  so you do not get dizzy or light-headed.  If you are in pain, you may need to take or ask for pain medication before doing incentive spirometry. It is harder to take a deep breath if you  are having pain. AFTER USE  Rest and breathe slowly and easily.  It can be helpful to keep track of a log of your progress. Your caregiver can provide you with a simple table to help with this. If you are using the spirometer at home, follow these instructions: [place] IF:   You are having difficultly using the spirometer.  You have trouble using the spirometer as often as instructed.  Your pain medication is not giving enough relief while using the spirometer.  You develop fever of 100.5 F (38.1 C) or higher. SEEK IMMEDIATE MEDICAL CARE IF:   You cough up bloody sputum that had not been present before.  You develop fever of 102 F (38.9 C) or greater.  You develop worsening pain at or near the incision site. MAKE SURE YOU:   Understand these instructions.  Will watch your condition.  Will get help right away if you are not doing well or get worse. Document Released: 03/22/2007 Document Revised: 02/01/2012 Document Reviewed: 05/23/2007 Exit[employer] Patient Information 2014 [employer], [place].   ________________________________________________________________________  WHAT IS A BLOOD TRANSFUSION? Blood Transfusion Information  A transfusion is the replacement of blood or some of its parts. Blood is made up of multiple cells which provide different functions.  Red blood cells carry oxygen and are used for blood loss replacement.  White blood cells fight against infection.  Platelets control bleeding.  Plasma helps clot blood.  Other blood products are available for specialized needs, such as hemophilia or other clotting disorders. BEFORE THE TRANSFUSION  Who gives blood for transfusions?   Healthy volunteers who are fully evaluated to make sure their blood is safe. This is blood bank blood. Transfusion therapy is the safest it has ever been in the practice of medicine. Before blood is taken from a donor, a complete history is taken to make sure that person has  no history of diseases nor engages in risky social behavior (examples are intravenous drug use or sexual activity with multiple partners). The donor's travel history is screened to minimize risk of transmitting infections, such as malaria. The donated blood is tested for signs of infectious diseases, such as HIV and hepatitis. The blood is then tested to be sure it is compatible with you in order to minimize the chance of a transfusion reaction. If you or a relative donates blood, this is often done in anticipation of surgery and is not appropriate for emergency situations. It takes many days to process the donated blood. RISKS AND COMPLICATIONS Although transfusion therapy is very safe and saves many lives, the main dangers of transfusion include:   Getting an infectious disease.  Developing a transfusion reaction. This is an allergic reaction to something in the blood you were given. Every precaution is taken to prevent this. The decision to have a blood transfusion has been considered carefully by your caregiver before blood is given. Blood is not given unless the benefits outweigh the risks. AFTER THE TRANSFUSION  Right after receiving a blood transfusion, you will usually feel much better and more energetic. This is especially true if your red blood cells have gotten low (anemic). The transfusion raises the level of the red blood cells which carry oxygen, and this usually causes an energy increase.  The  nurse administering the transfusion will monitor you carefully for complications. HOME CARE INSTRUCTIONS  No special instructions are needed after a transfusion. You may find your energy is better. Speak with your caregiver about any limitations on activity for underlying diseases you may have. SEEK MEDICAL CARE IF:   Your condition is not improving after your transfusion.  You develop redness or irritation at the intravenous (IV) site. SEEK IMMEDIATE MEDICAL CARE IF:  Any of the following  symptoms occur over the next 12 hours:  Shaking chills.  You have a temperature by mouth above 102 F (38.9 C), not controlled by medicine.  Chest, back, or muscle pain.  People around you feel you are not acting correctly or are confused.  Shortness of breath or difficulty breathing.  Dizziness and fainting.  You get a rash or develop hives.  You have a decrease in urine output.  Your urine turns a dark color or changes to pink, red, or brown. Any of the following symptoms occur over the next 10 days:  You have a temperature by mouth above 102 F (38.9 C), not controlled by medicine.  Shortness of breath.  Weakness after normal activity.  The white part of the eye turns yellow (jaundice).  You have a decrease in the amount of urine or are urinating less often.  Your urine turns a dark color or changes to pink, red, or brown. Document Released: 11/06/2000 Document Revised: 02/01/2012 Document Reviewed: 06/25/2008 [facility] Patient Information 2014 [place], [place].  _______________________________________________________________________

## 2018-06-16 NOTE — Care Plan (Signed)
Spoke with paitent prior to surgery. She will discharge to home with HHPT and the following:   DME:  No needs  HHP: [facility]  OPPT:  [facility] [address] 07/11/18  MD follow up: 07/11/18 @ 1000.   Please contact Ladell Heads, [place], [phone], with questions or if this plan needs to change.   Thanks

## 2018-06-16 NOTE — Pre-Procedure Instructions (Signed)
Last office visit note Dr. Tamala Julian 03/14/18 in epic.

## 2018-06-17 ENCOUNTER — Ambulatory Visit (HOSPITAL_COMMUNITY)
Admission: RE | Admit: 2018-06-17 | Discharge: 2018-06-17 | Disposition: A | Discharge status: Home / Self Care | Payer: Medicare Other | Source: Ambulatory Visit | Attending: Orthopedic Surgery | Admitting: Orthopedic Surgery

## 2018-06-17 ENCOUNTER — Encounter (HOSPITAL_COMMUNITY)
Admission: RE | Admit: 2018-06-17 | Discharge: 2018-06-17 | Disposition: A | Discharge status: Home / Self Care | Payer: Medicare Other | Source: Ambulatory Visit | Attending: Orthopedic Surgery | Admitting: Orthopedic Surgery

## 2018-06-17 ENCOUNTER — Encounter (HOSPITAL_COMMUNITY): Payer: Self-pay

## 2018-06-17 ENCOUNTER — Other Ambulatory Visit: Payer: Self-pay

## 2018-06-17 DIAGNOSIS — Z01818 Encounter for other preprocedural examination: Secondary | ICD-10-CM | POA: Diagnosis not present

## 2018-06-17 DIAGNOSIS — M1712 Unilateral primary osteoarthritis, left knee: Secondary | ICD-10-CM | POA: Diagnosis not present

## 2018-06-17 DIAGNOSIS — I498 Other specified cardiac arrhythmias: Secondary | ICD-10-CM | POA: Diagnosis not present

## 2018-06-17 DIAGNOSIS — J984 Other disorders of lung: Secondary | ICD-10-CM | POA: Diagnosis not present

## 2018-06-17 DIAGNOSIS — Z01810 Encounter for preprocedural cardiovascular examination: Secondary | ICD-10-CM | POA: Insufficient documentation

## 2018-06-17 DIAGNOSIS — I10 Essential (primary) hypertension: Secondary | ICD-10-CM | POA: Diagnosis not present

## 2018-06-17 HISTORY — DX: Obesity, unspecified: E66.9

## 2018-06-17 LAB — URINALYSIS, ROUTINE W REFLEX MICROSCOPIC
Bilirubin Urine: NEGATIVE
Glucose, UA: NEGATIVE mg/dL
Ketones, ur: NEGATIVE mg/dL
Leukocytes, UA: NEGATIVE
Nitrite: NEGATIVE
PH: 5 (ref 5.0–8.0)
Protein, ur: NEGATIVE mg/dL
SPECIFIC GRAVITY, URINE: 1.016 (ref 1.005–1.030)

## 2018-06-17 LAB — CBC WITH DIFFERENTIAL/PLATELET
BASOS PCT: 0 %
Basophils Absolute: 0 10*3/uL (ref 0.0–0.1)
EOS ABS: 0.1 10*3/uL (ref 0.0–0.7)
Eosinophils Relative: 1 %
HCT: 43 % (ref 36.0–46.0)
HEMOGLOBIN: 14.1 g/dL (ref 12.0–15.0)
LYMPHS ABS: 1.8 10*3/uL (ref 0.7–4.0)
Lymphocytes Relative: 27 %
MCH: 30.6 pg (ref 26.0–34.0)
MCHC: 32.8 g/dL (ref 30.0–36.0)
MCV: 93.3 fL (ref 78.0–100.0)
Monocytes Absolute: 0.5 10*3/uL (ref 0.1–1.0)
Monocytes Relative: 7 %
NEUTROS PCT: 65 %
Neutro Abs: 4.4 10*3/uL (ref 1.7–7.7)
PLATELETS: 215 10*3/uL (ref 150–400)
RBC: 4.61 MIL/uL (ref 3.87–5.11)
RDW: 13 % (ref 11.5–15.5)
WBC: 6.8 10*3/uL (ref 4.0–10.5)

## 2018-06-17 LAB — COMPREHENSIVE METABOLIC PANEL
ALBUMIN: 4.1 g/dL (ref 3.5–5.0)
ALK PHOS: 42 U/L (ref 38–126)
ALT: 14 U/L (ref 0–44)
ANION GAP: 9 (ref 5–15)
AST: 21 U/L (ref 15–41)
BUN: 18 mg/dL (ref 8–23)
CALCIUM: 9.1 mg/dL (ref 8.9–10.3)
CHLORIDE: 103 mmol/L (ref 98–111)
CO2: 27 mmol/L (ref 22–32)
CREATININE: 1.24 mg/dL — AB (ref 0.44–1.00)
GFR calc non Af Amer: 44 mL/min — ABNORMAL LOW (ref >60)
GFR, EST AFRICAN AMERICAN: 51 mL/min — AB (ref >60)
GLUCOSE: 93 mg/dL (ref 70–99)
Potassium: 4.3 mmol/L (ref 3.5–5.1)
SODIUM: 139 mmol/L (ref 135–145)
Total Bilirubin: 0.9 mg/dL (ref 0.3–1.2)
Total Protein: 7.3 g/dL (ref 6.5–8.1)

## 2018-06-17 LAB — SURGICAL PCR SCREEN
MRSA, PCR: NEGATIVE
STAPHYLOCOCCUS AUREUS: NEGATIVE

## 2018-06-17 LAB — APTT: APTT: 28 s (ref 24–36)

## 2018-06-17 LAB — PROTIME-INR
INR: 0.91
Prothrombin Time: 12.1 seconds (ref 11.4–15.2)

## 2018-06-17 LAB — ABO/RH: ABO/RH(D): A POS

## 2018-06-17 NOTE — Pre-Procedure Instructions (Signed)
CMP, UA, CXR results 06/17/18 faxed to Dr. Berenice Primas via epic.

## 2018-06-23 ENCOUNTER — Encounter (HOSPITAL_COMMUNITY): Payer: Self-pay | Admitting: Anesthesiology

## 2018-06-23 NOTE — Anesthesia Preprocedure Evaluation (Addendum)
Anesthesia Evaluation  Patient identified by MRN, date of birth, ID band Patient awake    Reviewed: Allergy & Precautions, NPO status , Patient's Chart, lab work & pertinent test results  Airway Mallampati: II  TM Distance: >3 FB Neck ROM: Full    Dental  (+) Edentulous Upper   Pulmonary former smoker,    Pulmonary exam normal breath sounds clear to auscultation       Cardiovascular hypertension, Pt. on medications Normal cardiovascular exam Rhythm:Regular Rate:Normal     Neuro/Psych PSYCHIATRIC DISORDERS Anxiety Depression negative neurological ROS     GI/Hepatic negative GI ROS, Neg liver ROS,   Endo/Other  Obesity Osteoporosis Hypercholesterolemia  Renal/GU Renal InsufficiencyRenal disease  negative genitourinary   Musculoskeletal  (+) Arthritis , Osteoarthritis,  OA left knee   Abdominal (+) + obese,   Peds  Hematology negative hematology ROS (+)   Anesthesia Other Findings   Reproductive/Obstetrics                            Anesthesia Physical Anesthesia Plan  ASA: II  Anesthesia Plan: Spinal   Post-op Pain Management:  [facility] for Post-op pain   Induction:   PONV Risk Score and Plan: 3 and Ondansetron, Propofol infusion, Treatment may vary due to age or medical condition and Dexamethasone  Airway Management Planned: Natural Airway, Simple Face Mask and Nasal Cannula  Additional Equipment:   Intra-op Plan:   Post-operative Plan:   Informed Consent: I have reviewed the patients History and Physical, chart, labs and discussed the procedure including the risks, benefits and alternatives for the proposed anesthesia with the patient or authorized representative who has indicated his/her understanding and acceptance.   Dental advisory given  Plan Discussed with: CRNA and Surgeon  Anesthesia Plan Comments:        Anesthesia Quick Evaluation

## 2018-06-23 NOTE — H&P (Addendum)
TOTAL KNEE ADMISSION H&P  Patient is being admitted for left total knee arthroplasty.  Subjective:  Chief Complaint:left knee pain.  HPI: Nicole Peters, 67 y.o. female, has a history of pain and functional disability in the left knee due to arthritis and has failed non-surgical conservative treatments for greater than 12 weeks to includeNSAID's and/or analgesics, corticosteriod injections, viscosupplementation injections, weight reduction as appropriate and activity modification.  Onset of symptoms was gradual, starting 8 years ago with gradually worsening course since that time. The patient noted no past surgery on the left knee(s).  Patient currently rates pain in the left knee(s) at 9 out of 10 with activity. Patient has night pain, worsening of pain with activity and weight bearing, pain that interferes with activities of daily living, pain with passive range of motion, crepitus and joint swelling.  Patient has evidence of subchondral sclerosis, periarticular osteophytes and joint space narrowing by imaging studies. This patient has had Failure of all reasonable conservative care. There is no active infection.  Patient Active Problem List   Diagnosis Date Noted  . Age-related osteoporosis without current pathological fracture 10/22/2016  . Pure hypercholesterolemia 08/12/2015  . Primary osteoarthritis of both knees 08/12/2015  . Hematuria 08/13/2014  . DJD (degenerative joint disease) 10/18/2012  . Depression with anxiety 10/18/2012  . Obesity (BMI 30.0-34.9) 10/14/2012  . HTN (hypertension) 06/10/2012   Past Medical History:  Diagnosis Date  . Allergy   . Anxiety   . Arthritis   . Depression   . History of bronchitis   . Hyperlipidemia   . Hypertension   . Obesity   . Osteoporosis   . Stress incontinence     Past Surgical History:  Procedure Laterality Date  . APPENDECTOMY    . Arthroscopic knee surgery     Both knees- Dr. Berenice Primas  . CHOLECYSTECTOMY    . COLONOSCOPY   12/22/2007   Diverticulosis. Robert Kaplan/[place]. Repeat in 10 years.  . INJECTION KNEE Left 03/16/2016   Procedure: LEFT KNEE CORTISONE INJECTION. ;  Surgeon: Dorna Leitz, MD;  Location: [facility];  Service: Orthopedics;  Laterality: Left;  . KNEE SURGERY Bilateral    arthroscopic  . LAPAROTOMY     lysis of scar adhesion  . TONSILLECTOMY    . TOTAL KNEE ARTHROPLASTY Right 03/16/2016   Procedure: RIGHT TOTAL KNEE ARTHROPLASTY ( LATERAL APPROACH) & LEFT KNEE CORTISONE INJECTION. ;  Surgeon: Dorna Leitz, MD;  Location: [facility];  Service: Orthopedics;  Laterality: Right;  . TUBAL LIGATION      Current Facility-Administered Medications  Medication Dose Route Frequency Provider Last Rate Last Dose  . 0.9 %  sodium chloride infusion  500 mL Intravenous Once Nandigam, Venia Minks, MD       Current Outpatient Medications  Medication Sig Dispense Refill Last Dose  . alendronate (FOSAMAX) 70 MG tablet Take 1 tablet (70 mg total) by mouth every 7 (seven) days. Take with a full glass of water on an empty stomach. 12 tablet 3   . ALPRAZolam (XANAX) 0.25 MG tablet TAKE 1 TO 1& 1/2 TABLETS BY MOUTH DAILY (Patient taking differently: Take 0.125-0.25 mg by mouth See admin instructions. Take 0.25 mg in the morning and 0.125 mg at night) 135 tablet 1   . aspirin EC 81 MG tablet Take 81 mg by mouth daily.     . benazepril (LOTENSIN) 20 MG tablet Take 1 tablet (20 mg total) by mouth daily. 90 tablet 3   . buPROPion (WELLBUTRIN XL) 300 MG 24  hr tablet Take 1 tablet (300 mg total) by mouth daily. 90 tablet 3   . cetirizine (ZYRTEC) 10 MG tablet Take 10 mg by mouth daily as needed for allergies.    Taking  . Cholecalciferol (VITAMIN D3) 2000 units capsule Take 2,000 Units by mouth daily.     Marland Kitchen ibuprofen (ADVIL,MOTRIN) 200 MG tablet Take 400 mg by mouth 2 (two) times daily as needed for moderate pain.    Taking  . Multiple Vitamin (MULTIVITAMIN) tablet Take 1 tablet by mouth daily.   Taking  . pravastatin (PRAVACHOL) 80  MG tablet Take 1 tablet (80 mg total) by mouth at bedtime. 90 tablet 3    No Known Allergies  Social History   Tobacco Use  . Smoking status: Former Smoker    Packs/day: 0.25    Years: 40.00    Pack years: 10.00    Types: Cigarettes    Last attempt to quit: 12/12/2016    Years since quitting: 1.5  . Smokeless tobacco: Never Used  . Tobacco comment: quit smoking 2018  Substance Use Topics  . Alcohol use: No    Alcohol/week: 0.0 oz    Family History  Problem Relation Age of Onset  . Cancer Father        stomach cancer with liver mets  . Heart disease Father        angina; no AMI/CAD;no CABG  . Stomach cancer Father   . Heart disease Brother 50       mild heart attack/AMI in 1997  . Colon cancer Neg Hx   . Colon polyps Neg Hx   . Esophageal cancer Neg Hx   . Rectal cancer Neg Hx      ROS ROS: I have reviewed the patient's review of systems thoroughly and there are no positive responses as relates to the HPI. Objective:  Physical Exam  Vital signs in last 24 hours:    Vitals:   06/24/18 0544 06/24/18 0555  BP: (!) 174/95   Pulse: 91   Resp: 16   Temp: 98.5 F (36.9 C)   SpO2: 91% 95%   Well-developed well-nourished patient in no acute distress. Alert and oriented x3 HEENT:within normal limits Cardiac: Regular rate and rhythm Pulmonary: Lungs clear to auscultation Abdomen: Soft and nontender.  Normal active bowel sounds  Musculoskeletal: (Left knee: Limited range of motion.  Painful range of motion.  No instability.  Trace effusion. Labs: Recent Results (from the past 2160 hour(s))  Urinalysis, Routine w reflex microscopic     Status: Abnormal   Collection Time: 06/17/18  8:48 AM  Result Value Ref Range   Color, Urine YELLOW YELLOW   APPearance CLEAR CLEAR   Specific Gravity, Urine 1.016 1.005 - 1.030   pH 5.0 5.0 - 8.0   Glucose, UA NEGATIVE NEGATIVE mg/dL   Hgb urine dipstick SMALL (A) NEGATIVE   Bilirubin Urine NEGATIVE NEGATIVE   Ketones, ur  NEGATIVE NEGATIVE mg/dL   Protein, ur NEGATIVE NEGATIVE mg/dL   Nitrite NEGATIVE NEGATIVE   Leukocytes, UA NEGATIVE NEGATIVE   RBC / HPF 0-5 0 - 5 RBC/hpf   WBC, UA 0-5 0 - 5 WBC/hpf   Bacteria, UA RARE (A) NONE SEEN   Squamous Epithelial / LPF 0-5 0 - 5   Mucus PRESENT     Comment: Performed at [facility], [place] [address] [place], [place] [address]  Surgical pcr screen     Status: None   Collection Time: 06/17/18  9:00 AM  Result Value Ref Range   MRSA, PCR NEGATIVE NEGATIVE   Staphylococcus aureus NEGATIVE NEGATIVE    Comment: (NOTE) The Xpert SA Assay (FDA approved for NASAL specimens in patients 10 years of age and older), is one component of a comprehensive surveillance program. It is not intended to diagnose infection nor to guide or monitor treatment. Performed at [facility], [place] [address], [place], [place] [address]   ABO/Rh     Status: None   Collection Time: 06/17/18  9:01 AM  Result Value Ref Range   ABO/RH(D)      A POS Performed at [facility], [place] [address], [place], [place] [address]   APTT     Status: None   Collection Time: 06/17/18  9:07 AM  Result Value Ref Range   aPTT 28 24 - 36 seconds    Comment: Performed at [facility], [place] [address], [place], [place] [address]  CBC WITH DIFFERENTIAL     Status: None   Collection Time: 06/17/18  9:07 AM  Result Value Ref Range   WBC 6.8 4.0 - 10.5 K/uL   RBC 4.61 3.87 - 5.11 MIL/uL   Hemoglobin 14.1 12.0 - 15.0 g/dL   HCT 43.0 36.0 - 46.0 %   MCV 93.3 78.0 - 100.0 fL   MCH 30.6 26.0 - 34.0 pg   MCHC 32.8 30.0 - 36.0 g/dL   RDW 13.0 11.5 - 15.5 %   Platelets 215 150 - 400 K/uL   Neutrophils Relative % 65 %   Neutro Abs 4.4 1.7 - 7.7 K/uL   Lymphocytes Relative 27 %   Lymphs Abs 1.8 0.7 - 4.0 K/uL   Monocytes Relative 7 %   Monocytes Absolute 0.5 0.1 - 1.0 K/uL   Eosinophils Relative 1 %   Eosinophils Absolute 0.1 0.0 - 0.7  K/uL   Basophils Relative 0 %   Basophils Absolute 0.0 0.0 - 0.1 K/uL    Comment: Performed at [facility], [place] [address], [place], [place] [address]  Comprehensive metabolic panel     Status: Abnormal   Collection Time: 06/17/18  9:07 AM  Result Value Ref Range   Sodium 139 135 - 145 mmol/L   Potassium 4.3 3.5 - 5.1 mmol/L   Chloride 103 98 - 111 mmol/L   CO2 27 22 - 32 mmol/L   Glucose, Bld 93 70 - 99 mg/dL   BUN 18 8 - 23 mg/dL   Creatinine, Ser 1.24 (H) 0.44 - 1.00 mg/dL   Calcium 9.1 8.9 - 10.3 mg/dL   Total Protein 7.3 6.5 - 8.1 g/dL   Albumin 4.1 3.5 - 5.0 g/dL   AST 21 15 - 41 U/L   ALT 14 0 - 44 U/L   Alkaline Phosphatase 42 38 - 126 U/L   Total Bilirubin 0.9 0.3 - 1.2 mg/dL   GFR calc non Af Amer 44 (L) >60 mL/min   GFR calc Af Amer 51 (L) >60 mL/min    Comment: (NOTE) The eGFR has been calculated using the CKD EPI equation. This calculation has not been validated in all clinical situations. eGFR's persistently <60 mL/min signify possible Chronic Kidney Disease.    Anion gap 9 5 - 15    Comment: Performed at [facility], [place] [address], [place], [place] [address]  Protime-INR     Status: None   Collection Time: 06/17/18  9:07 AM  Result Value Ref Range   Prothrombin Time  12.1 11.4 - 15.2 seconds   INR 0.91     Comment: Performed at [facility], [place] [address] [place], [place] [address]  Type and screen Order type and screen if day of surgery is less than 15 days from draw of preadmission visit or order morning of surgery if day of surgery is greater than 6 days from preadmission visit.     Status: None   Collection Time: 06/17/18  9:07 AM  Result Value Ref Range   ABO/RH(D) A POS    Antibody Screen NEG    Sample Expiration 07/01/2018    Extend sample reason      NO TRANSFUSIONS OR PREGNANCY IN THE PAST 3 MONTHS Performed at [facility], [place] [address], [place],  [address]      Estimated body mass index is 36.74 kg/m as calculated from the following:   Height as of 06/17/18: 5' 2.5" (1.588 m).   Weight as of 06/17/18: 92.6 kg (204 lb 2 oz).   Imaging Review Plain radiographs demonstrate severe degenerative joint disease of the left knee(s). The overall alignment ismild varus. The bone quality appears to be fair for age and reported activity level.   Preoperative templating of the joint replacement has been completed, documented, and submitted to the Operating Room personnel in order to optimize intra-operative equipment management.    Patient's anticipated LOS is less than 2 midnights, meeting these requirements: - Younger than 16 - Lives within 1 hour of care - Has a competent adult at home to recover with post-op recover - NO history of  - Chronic pain requiring opiods  - Diabetes  - Coronary Artery Disease  - Heart failure  - Heart attack  - Stroke  - DVT/VTE  - Cardiac arrhythmia  - Respiratory Failure/COPD  - Renal failure  - Anemia  - Advanced Liver disease        Assessment/Plan:  End stage arthritis, left knee   The patient history, physical examination, clinical judgment of the provider and imaging studies are consistent with end stage degenerative joint disease of the left knee(s) and total knee arthroplasty is deemed medically necessary. The treatment options including medical management, injection therapy arthroscopy and arthroplasty were discussed at length. The risks and benefits of total knee arthroplasty were presented and reviewed. The risks due to aseptic loosening, infection, stiffness, patella tracking problems, thromboembolic complications and other imponderables were discussed. The patient acknowledged the explanation, agreed to proceed with the plan and consent was signed. Patient is being admitted for inpatient treatment for surgery, pain control, PT, OT, prophylactic antibiotics, VTE prophylaxis, progressive ambulation  and ADL's and discharge planning. The patient is planning to be discharged home with home health services  No change in H_0  overnight

## 2018-06-24 ENCOUNTER — Inpatient Hospital Stay (HOSPITAL_COMMUNITY)
Admission: RE | Admit: 2018-06-24 | Discharge: 2018-06-25 | DRG: 470 | Disposition: A | Discharge status: Home Health | Payer: Medicare Other | Source: Ambulatory Visit | Attending: Orthopedic Surgery | Admitting: Orthopedic Surgery

## 2018-06-24 ENCOUNTER — Ambulatory Visit (HOSPITAL_COMMUNITY): Payer: Medicare Other | Admitting: Anesthesiology

## 2018-06-24 ENCOUNTER — Other Ambulatory Visit: Payer: Self-pay

## 2018-06-24 ENCOUNTER — Encounter (HOSPITAL_COMMUNITY): Payer: Self-pay | Admitting: Emergency Medicine

## 2018-06-24 ENCOUNTER — Encounter (HOSPITAL_COMMUNITY)
Admission: RE | Disposition: A | Discharge status: Home Health | Payer: Self-pay | Source: Ambulatory Visit | Attending: Orthopedic Surgery

## 2018-06-24 DIAGNOSIS — E785 Hyperlipidemia, unspecified: Secondary | ICD-10-CM | POA: Diagnosis present

## 2018-06-24 DIAGNOSIS — Z6836 Body mass index (BMI) 36.0-36.9, adult: Secondary | ICD-10-CM

## 2018-06-24 DIAGNOSIS — Z7983 Long term (current) use of bisphosphonates: Secondary | ICD-10-CM | POA: Diagnosis not present

## 2018-06-24 DIAGNOSIS — I10 Essential (primary) hypertension: Secondary | ICD-10-CM | POA: Diagnosis present

## 2018-06-24 DIAGNOSIS — Z7982 Long term (current) use of aspirin: Secondary | ICD-10-CM

## 2018-06-24 DIAGNOSIS — Z87891 Personal history of nicotine dependence: Secondary | ICD-10-CM | POA: Diagnosis not present

## 2018-06-24 DIAGNOSIS — M810 Age-related osteoporosis without current pathological fracture: Secondary | ICD-10-CM | POA: Diagnosis present

## 2018-06-24 DIAGNOSIS — Z9049 Acquired absence of other specified parts of digestive tract: Secondary | ICD-10-CM | POA: Diagnosis not present

## 2018-06-24 DIAGNOSIS — F419 Anxiety disorder, unspecified: Secondary | ICD-10-CM | POA: Diagnosis present

## 2018-06-24 DIAGNOSIS — M1712 Unilateral primary osteoarthritis, left knee: Secondary | ICD-10-CM | POA: Diagnosis present

## 2018-06-24 DIAGNOSIS — Z79899 Other long term (current) drug therapy: Secondary | ICD-10-CM

## 2018-06-24 DIAGNOSIS — Z8249 Family history of ischemic heart disease and other diseases of the circulatory system: Secondary | ICD-10-CM

## 2018-06-24 DIAGNOSIS — Z800 Family history of malignant neoplasm of digestive organs: Secondary | ICD-10-CM | POA: Diagnosis not present

## 2018-06-24 DIAGNOSIS — Z96651 Presence of right artificial knee joint: Secondary | ICD-10-CM | POA: Diagnosis present

## 2018-06-24 DIAGNOSIS — F329 Major depressive disorder, single episode, unspecified: Secondary | ICD-10-CM | POA: Diagnosis present

## 2018-06-24 DIAGNOSIS — E7800 Pure hypercholesterolemia, unspecified: Secondary | ICD-10-CM | POA: Diagnosis not present

## 2018-06-24 DIAGNOSIS — E669 Obesity, unspecified: Secondary | ICD-10-CM | POA: Diagnosis present

## 2018-06-24 DIAGNOSIS — F418 Other specified anxiety disorders: Secondary | ICD-10-CM | POA: Diagnosis not present

## 2018-06-24 DIAGNOSIS — G8918 Other acute postprocedural pain: Secondary | ICD-10-CM | POA: Diagnosis not present

## 2018-06-24 HISTORY — PX: TOTAL KNEE ARTHROPLASTY: SHX125

## 2018-06-24 LAB — TYPE AND SCREEN
ABO/RH(D): A POS
Antibody Screen: NEGATIVE

## 2018-06-24 SURGERY — ARTHROPLASTY, KNEE, TOTAL
Anesthesia: Spinal | Site: Knee | Laterality: Left

## 2018-06-24 MED ORDER — HYDROCODONE-ACETAMINOPHEN 5-325 MG PO TABS: 1.0000 | ORAL_TABLET | Freq: Four times a day (QID) | ORAL | 0 refills | Status: DC | PRN

## 2018-06-24 MED ORDER — DOCUSATE SODIUM 100 MG PO CAPS: 100.0000 mg | ORAL_CAPSULE | Freq: Two times a day (BID) | ORAL | 0 refills | Status: DC

## 2018-06-24 MED ORDER — ALPRAZOLAM 0.25 MG PO TABS: 0.1250 mg | ORAL_TABLET | ORAL | Status: DC

## 2018-06-24 MED ORDER — ALPRAZOLAM 0.25 MG PO TABS
0.1250 mg | ORAL_TABLET | ORAL | Status: DC
Start: 2018-06-24 — End: 2018-06-24

## 2018-06-24 MED ORDER — ONDANSETRON HCL 4 MG/2ML IJ SOLN
INTRAMUSCULAR | Status: AC
  Filled 2018-06-24: qty 2

## 2018-06-24 MED ORDER — TRANEXAMIC ACID 1000 MG/10ML IV SOLN
1000.0000 mg | Freq: Once | INTRAVENOUS | Status: AC
  Administered 2018-06-24: 1000 mg via INTRAVENOUS
  Filled 2018-06-24: qty 1000

## 2018-06-24 MED ORDER — PROPOFOL 10 MG/ML IV BOLUS
INTRAVENOUS | Status: AC
Start: 2018-06-24 — End: ?
  Filled 2018-06-24: qty 60

## 2018-06-24 MED ORDER — BUPIVACAINE-EPINEPHRINE (PF) 0.5% -1:200000 IJ SOLN
INTRAMUSCULAR | Status: AC
  Filled 2018-06-24: qty 30

## 2018-06-24 MED ORDER — CEFAZOLIN SODIUM-DEXTROSE 2-4 GM/100ML-% IV SOLN
2.0000 g | Freq: Four times a day (QID) | INTRAVENOUS | Status: AC
  Administered 2018-06-24 (×2): 2 g via INTRAVENOUS
  Filled 2018-06-24 (×2): qty 100

## 2018-06-24 MED ORDER — ACETAMINOPHEN 10 MG/ML IV SOLN
INTRAVENOUS | Status: AC
  Filled 2018-06-24: qty 100

## 2018-06-24 MED ORDER — BUPIVACAINE-EPINEPHRINE 0.5% -1:200000 IJ SOLN
INTRAMUSCULAR | Status: DC | PRN
  Administered 2018-06-24: 20 mL

## 2018-06-24 MED ORDER — SODIUM CHLORIDE 0.9 % IV SOLN
INTRAVENOUS | Status: DC
  Administered 2018-06-24: 15:00:00 via INTRAVENOUS

## 2018-06-24 MED ORDER — DEXAMETHASONE SODIUM PHOSPHATE 10 MG/ML IJ SOLN
INTRAMUSCULAR | Status: AC
  Filled 2018-06-24: qty 1

## 2018-06-24 MED ORDER — FENTANYL CITRATE (PF) 100 MCG/2ML IJ SOLN
INTRAMUSCULAR | Status: DC | PRN
  Administered 2018-06-24 (×2): 50 ug via INTRAVENOUS

## 2018-06-24 MED ORDER — ROPIVACAINE HCL 7.5 MG/ML IJ SOLN
INTRAMUSCULAR | Status: DC | PRN
  Administered 2018-06-24: 20 mL via PERINEURAL

## 2018-06-24 MED ORDER — CELECOXIB 200 MG PO CAPS
200.0000 mg | ORAL_CAPSULE | Freq: Two times a day (BID) | ORAL | Status: DC
  Administered 2018-06-25: 200 mg via ORAL
  Filled 2018-06-24 (×2): qty 1

## 2018-06-24 MED ORDER — HYDROCODONE-ACETAMINOPHEN 5-325 MG PO TABS
1.0000 | ORAL_TABLET | ORAL | Status: DC | PRN
  Administered 2018-06-24 – 2018-06-25 (×4): 1 via ORAL
  Filled 2018-06-24 (×3): qty 1
  Filled 2018-06-24: qty 2
  Filled 2018-06-24: qty 1

## 2018-06-24 MED ORDER — SODIUM CHLORIDE 0.9 % IJ SOLN
INTRAMUSCULAR | Status: AC
  Filled 2018-06-24: qty 50

## 2018-06-24 MED ORDER — DIPHENHYDRAMINE HCL 12.5 MG/5ML PO ELIX
12.5000 mg | ORAL_SOLUTION | ORAL | Status: DC | PRN
Start: 2018-06-24 — End: 2018-06-25

## 2018-06-24 MED ORDER — MIDAZOLAM HCL 5 MG/5ML IJ SOLN
INTRAMUSCULAR | Status: DC | PRN
  Administered 2018-06-24 (×2): 1 mg via INTRAVENOUS

## 2018-06-24 MED ORDER — PROPOFOL 10 MG/ML IV BOLUS
INTRAVENOUS | Status: AC
  Filled 2018-06-24: qty 20

## 2018-06-24 MED ORDER — GABAPENTIN 300 MG PO CAPS
300.0000 mg | ORAL_CAPSULE | Freq: Two times a day (BID) | ORAL | Status: DC
  Filled 2018-06-24 (×2): qty 1

## 2018-06-24 MED ORDER — ALPRAZOLAM 0.25 MG PO TABS: 0.1250 mg | ORAL_TABLET | Freq: Every day | ORAL | Status: DC

## 2018-06-24 MED ORDER — BENAZEPRIL HCL 10 MG PO TABS
20.0000 mg | ORAL_TABLET | Freq: Every day | ORAL | Status: DC
  Administered 2018-06-25: 20 mg via ORAL
  Filled 2018-06-24: qty 2

## 2018-06-24 MED ORDER — TRANEXAMIC ACID 1000 MG/10ML IV SOLN
1000.0000 mg | INTRAVENOUS | Status: AC
  Administered 2018-06-24: 1000 mg via INTRAVENOUS
  Filled 2018-06-24: qty 10
  Filled 2018-06-24: qty 1000

## 2018-06-24 MED ORDER — PROPOFOL 500 MG/50ML IV EMUL
INTRAVENOUS | Status: DC | PRN
  Administered 2018-06-24: 100 ug/kg/min via INTRAVENOUS

## 2018-06-24 MED ORDER — ALPRAZOLAM 0.25 MG PO TABS
0.2500 mg | ORAL_TABLET | Freq: Every day | ORAL | Status: DC
  Administered 2018-06-25: 0.25 mg via ORAL
  Filled 2018-06-24: qty 1

## 2018-06-24 MED ORDER — MAGNESIUM CITRATE PO SOLN: 1.0000 | Freq: Once | ORAL | Status: DC | PRN

## 2018-06-24 MED ORDER — MORPHINE SULFATE (PF) 2 MG/ML IV SOLN
0.5000 mg | INTRAVENOUS | Status: DC | PRN
  Filled 2018-06-24: qty 1

## 2018-06-24 MED ORDER — ONDANSETRON HCL 4 MG/2ML IJ SOLN: 4.0000 mg | Freq: Four times a day (QID) | INTRAMUSCULAR | Status: DC | PRN

## 2018-06-24 MED ORDER — MIDAZOLAM HCL 2 MG/2ML IJ SOLN
INTRAMUSCULAR | Status: AC
  Filled 2018-06-24: qty 2

## 2018-06-24 MED ORDER — ALPRAZOLAM 0.25 MG PO TABS
0.2500 mg | ORAL_TABLET | Freq: Every day | ORAL | Status: DC
  Administered 2018-06-24: 0.25 mg via ORAL
  Filled 2018-06-24: qty 1

## 2018-06-24 MED ORDER — 0.9 % SODIUM CHLORIDE (POUR BTL) OPTIME
TOPICAL | Status: DC | PRN
  Administered 2018-06-24: 1000 mL

## 2018-06-24 MED ORDER — SODIUM CHLORIDE 0.9 % IJ SOLN
INTRAMUSCULAR | Status: DC | PRN
  Administered 2018-06-24: 30 mL

## 2018-06-24 MED ORDER — HYDROMORPHONE HCL 1 MG/ML IJ SOLN: 0.2500 mg | INTRAMUSCULAR | Status: DC | PRN

## 2018-06-24 MED ORDER — CEFAZOLIN SODIUM-DEXTROSE 2-4 GM/100ML-% IV SOLN
2.0000 g | INTRAVENOUS | Status: AC
  Administered 2018-06-24: 2 g via INTRAVENOUS
  Filled 2018-06-24: qty 100

## 2018-06-24 MED ORDER — BUPIVACAINE IN DEXTROSE 0.75-8.25 % IT SOLN
INTRATHECAL | Status: DC | PRN
  Administered 2018-06-24: 2 mL via INTRATHECAL

## 2018-06-24 MED ORDER — METHOCARBAMOL 500 MG IVPB - SIMPLE MED
500.0000 mg | Freq: Four times a day (QID) | INTRAVENOUS | Status: DC | PRN
  Administered 2018-06-24: 500 mg via INTRAVENOUS
  Filled 2018-06-24: qty 50
  Filled 2018-06-24: qty 500

## 2018-06-24 MED ORDER — PRAVASTATIN SODIUM 20 MG PO TABS
80.0000 mg | ORAL_TABLET | Freq: Every day | ORAL | Status: DC
  Administered 2018-06-24: 80 mg via ORAL
  Filled 2018-06-24: qty 4

## 2018-06-24 MED ORDER — MEPERIDINE HCL 50 MG/ML IJ SOLN: 6.2500 mg | INTRAMUSCULAR | Status: DC | PRN

## 2018-06-24 MED ORDER — POLYETHYLENE GLYCOL 3350 17 G PO PACK: 17.0000 g | PACK | Freq: Every day | ORAL | Status: DC | PRN

## 2018-06-24 MED ORDER — LACTATED RINGERS IV SOLN
INTRAVENOUS | Status: DC
  Administered 2018-06-24 (×3): via INTRAVENOUS

## 2018-06-24 MED ORDER — ONDANSETRON HCL 4 MG PO TABS: 4.0000 mg | ORAL_TABLET | Freq: Four times a day (QID) | ORAL | Status: DC | PRN

## 2018-06-24 MED ORDER — METHOCARBAMOL 500 MG PO TABS
500.0000 mg | ORAL_TABLET | Freq: Four times a day (QID) | ORAL | Status: DC | PRN
  Administered 2018-06-25 (×2): 500 mg via ORAL
  Filled 2018-06-24 (×2): qty 1

## 2018-06-24 MED ORDER — BUPROPION HCL ER (XL) 300 MG PO TB24
300.0000 mg | ORAL_TABLET | Freq: Every day | ORAL | Status: DC
  Administered 2018-06-24 – 2018-06-25 (×2): 300 mg via ORAL
  Filled 2018-06-24 (×3): qty 1

## 2018-06-24 MED ORDER — SODIUM CHLORIDE 0.9 % IR SOLN
Status: DC | PRN
  Administered 2018-06-24: 1000 mL

## 2018-06-24 MED ORDER — ASPIRIN EC 325 MG PO TBEC: 325.0000 mg | DELAYED_RELEASE_TABLET | Freq: Two times a day (BID) | ORAL | 0 refills | Status: DC

## 2018-06-24 MED ORDER — DOCUSATE SODIUM 100 MG PO CAPS
100.0000 mg | ORAL_CAPSULE | Freq: Two times a day (BID) | ORAL | Status: DC
  Administered 2018-06-24 – 2018-06-25 (×2): 100 mg via ORAL
  Filled 2018-06-24 (×2): qty 1

## 2018-06-24 MED ORDER — DEXAMETHASONE SODIUM PHOSPHATE 10 MG/ML IJ SOLN
10.0000 mg | Freq: Two times a day (BID) | INTRAMUSCULAR | Status: AC
  Administered 2018-06-24 – 2018-06-25 (×3): 10 mg via INTRAVENOUS
  Filled 2018-06-24 (×3): qty 1

## 2018-06-24 MED ORDER — TIZANIDINE HCL 2 MG PO TABS: 2.0000 mg | ORAL_TABLET | Freq: Three times a day (TID) | ORAL | 0 refills | Status: DC | PRN

## 2018-06-24 MED ORDER — PROPOFOL 10 MG/ML IV BOLUS
INTRAVENOUS | Status: DC | PRN
  Administered 2018-06-24: 30 mg via INTRAVENOUS

## 2018-06-24 MED ORDER — FENTANYL CITRATE (PF) 100 MCG/2ML IJ SOLN
INTRAMUSCULAR | Status: AC
  Filled 2018-06-24: qty 2

## 2018-06-24 MED ORDER — STERILE WATER FOR IRRIGATION IR SOLN
Status: DC | PRN
  Administered 2018-06-24: 2000 mL

## 2018-06-24 MED ORDER — BISACODYL 5 MG PO TBEC: 5.0000 mg | DELAYED_RELEASE_TABLET | Freq: Every day | ORAL | Status: DC | PRN

## 2018-06-24 MED ORDER — ALUM & MAG HYDROXIDE-SIMETH 200-200-20 MG/5ML PO SUSP: 30.0000 mL | ORAL | Status: DC | PRN

## 2018-06-24 MED ORDER — BUPIVACAINE LIPOSOME 1.3 % IJ SUSP
20.0000 mL | Freq: Once | INTRAMUSCULAR | Status: AC
  Administered 2018-06-24: 20 mL
  Filled 2018-06-24: qty 20

## 2018-06-24 MED ORDER — ASPIRIN EC 325 MG PO TBEC
325.0000 mg | DELAYED_RELEASE_TABLET | Freq: Two times a day (BID) | ORAL | Status: DC
  Administered 2018-06-24 – 2018-06-25 (×2): 325 mg via ORAL
  Filled 2018-06-24 (×2): qty 1

## 2018-06-24 MED ORDER — METOCLOPRAMIDE HCL 5 MG/ML IJ SOLN: 10.0000 mg | Freq: Once | INTRAMUSCULAR | Status: DC | PRN

## 2018-06-24 MED ORDER — ACETAMINOPHEN 325 MG PO TABS: 325.0000 mg | ORAL_TABLET | Freq: Four times a day (QID) | ORAL | Status: DC | PRN

## 2018-06-24 MED ORDER — CHLORHEXIDINE GLUCONATE 4 % EX LIQD: 60.0000 mL | Freq: Once | CUTANEOUS | Status: DC

## 2018-06-24 MED ORDER — PHENYLEPHRINE HCL 10 MG/ML IJ SOLN
INTRAMUSCULAR | Status: DC | PRN
  Administered 2018-06-24: 25 ug/min via INTRAVENOUS

## 2018-06-24 SURGICAL SUPPLY — 60 items
ATTUNE MED DOME PAT 38 KNEE (Knees) ×2 IMPLANT
ATTUNE MED DOME PAT 38MM KNEE (Knees) ×1 IMPLANT
ATTUNE PS FEM LT SZ 4 CEM KNEE (Femur) ×3 IMPLANT
ATTUNE PSRP INSR SZ4 6 KNEE (Insert) ×2 IMPLANT
ATTUNE PSRP INSR SZ4 6MM KNEE (Insert) ×1 IMPLANT
BAG ZIPLOCK 12X15 (MISCELLANEOUS) ×3 IMPLANT
BANDAGE ACE 6X5 VEL STRL LF (GAUZE/BANDAGES/DRESSINGS) ×3 IMPLANT
BASEPLATE TIBIAL ROTATING SZ 4 (Knees) ×3 IMPLANT
BENZOIN TINCTURE PRP APPL 2/3 (GAUZE/BANDAGES/DRESSINGS) ×3 IMPLANT
BLADE SAGITTAL 25.0X1.19X90 (BLADE) ×2 IMPLANT
BLADE SAGITTAL 25.0X1.19X90MM (BLADE) ×1
BLADE SAW SGTL 11.0X1.19X90.0M (BLADE) ×3 IMPLANT
BOOTIES KNEE HIGH SLOAN (MISCELLANEOUS) ×3 IMPLANT
BOWL SMART MIX CTS (DISPOSABLE) ×3 IMPLANT
CEMENT HV SMART SET (Cement) ×6 IMPLANT
CLOSURE WOUND 1/2 X4 (GAUZE/BANDAGES/DRESSINGS) ×1
CUFF TOURN SGL QUICK 34 (TOURNIQUET CUFF) ×2
CUFF TRNQT CYL 34X4X40X1 (TOURNIQUET CUFF) ×1 IMPLANT
DECANTER SPIKE VIAL GLASS SM (MISCELLANEOUS) IMPLANT
DRAPE U-SHAPE 47X51 STRL (DRAPES) ×3 IMPLANT
DRESSING AQUACEL AG SP 3.5X10 (GAUZE/BANDAGES/DRESSINGS) ×1 IMPLANT
DRSG AQUACEL AG ADV 3.5X10 (GAUZE/BANDAGES/DRESSINGS) ×3 IMPLANT
DRSG AQUACEL AG SP 3.5X10 (GAUZE/BANDAGES/DRESSINGS) ×3
DURAPREP 26ML APPLICATOR (WOUND CARE) ×3 IMPLANT
ELECT REM PT RETURN 15FT ADLT (MISCELLANEOUS) ×3 IMPLANT
GLOVE BIOGEL PI IND STRL 8 (GLOVE) ×3 IMPLANT
GLOVE BIOGEL PI INDICATOR 8 (GLOVE) ×6
GLOVE ECLIPSE 7.5 STRL STRAW (GLOVE) ×6 IMPLANT
GLOVE SURG SS PI 6.5 STRL IVOR (GLOVE) ×6 IMPLANT
GLOVE SURG SS PI 8.0 STRL IVOR (GLOVE) ×3 IMPLANT
GOWN STRL REUS W/TWL XL LVL3 (GOWN DISPOSABLE) ×6 IMPLANT
HANDPIECE INTERPULSE COAX TIP (DISPOSABLE) ×2
HOLDER FOLEY CATH W/STRAP (MISCELLANEOUS) IMPLANT
HOOD PEEL AWAY FLYTE STAYCOOL (MISCELLANEOUS) ×9 IMPLANT
IMMOBILIZER KNEE 20 (SOFTGOODS) ×6 IMPLANT
IMMOBILIZER KNEE 20 THIGH 36 (SOFTGOODS) ×1 IMPLANT
MANIFOLD NEPTUNE II (INSTRUMENTS) ×3 IMPLANT
NDL SAFETY ECLIPSE 18X1.5 (NEEDLE) IMPLANT
NEEDLE HYPO 18GX1.5 SHARP (NEEDLE)
NEEDLE HYPO 22GX1.5 SAFETY (NEEDLE) ×3 IMPLANT
NS IRRIG 1000ML POUR BTL (IV SOLUTION) ×3 IMPLANT
PACK ICE MAXI GEL EZY WRAP (MISCELLANEOUS) ×3 IMPLANT
PACK TOTAL KNEE CUSTOM (KITS) ×3 IMPLANT
PADDING CAST COTTON 6X4 STRL (CAST SUPPLIES) ×3 IMPLANT
POSITIONER SURGICAL ARM (MISCELLANEOUS) ×3 IMPLANT
SET HNDPC FAN SPRY TIP SCT (DISPOSABLE) ×1 IMPLANT
STAPLER VISISTAT 35W (STAPLE) IMPLANT
STRIP CLOSURE SKIN 1/2X4 (GAUZE/BANDAGES/DRESSINGS) ×2 IMPLANT
SUT MNCRL AB 3-0 PS2 18 (SUTURE) ×3 IMPLANT
SUT VIC AB 0 CT1 36 (SUTURE) ×3 IMPLANT
SUT VIC AB 1 CT1 36 (SUTURE) ×6 IMPLANT
SUT VIC AB 2-0 CT1 27 (SUTURE) ×4
SUT VIC AB 2-0 CT1 TAPERPNT 27 (SUTURE) ×2 IMPLANT
SYR 3ML LL SCALE MARK (SYRINGE) IMPLANT
SYR CONTROL 10ML LL (SYRINGE) ×6 IMPLANT
TOWEL OR NON WOVEN STRL DISP B (DISPOSABLE) ×3 IMPLANT
TRAY FOLEY CATH 14FR (SET/KITS/TRAYS/PACK) ×3 IMPLANT
WATER STERILE IRR 1000ML POUR (IV SOLUTION) ×6 IMPLANT
WRAP KNEE MAXI GEL POST OP (GAUZE/BANDAGES/DRESSINGS) ×3 IMPLANT
YANKAUER SUCT BULB TIP 10FT TU (MISCELLANEOUS) ×3 IMPLANT

## 2018-06-24 NOTE — Brief Op Note (Signed)
06/24/2018  9:34 AM  PATIENT:  Nicole Peters  67 y.o. female  PRE-OPERATIVE DIAGNOSIS:  OSTEOARTHRITIS LEFT KNEE  POST-OPERATIVE DIAGNOSIS:  OSTEOARTHRITIS LEFT KNEE  PROCEDURE:  Procedure(s): LEFT TOTAL KNEE ARTHROPLASTY (Left)  SURGEON:  Surgeon(s) and Role:    Dorna Leitz, MD - Primary  PHYSICIAN ASSISTANT:   ASSISTANTS: bethune pa   ANESTHESIA:   spinal  EBL:  50 mL   BLOOD ADMINISTERED:none  DRAINS: none   LOCAL MEDICATIONS USED:  MARCAINE    and OTHER experel  SPECIMEN:  No Specimen  DISPOSITION OF SPECIMEN:  N/A  COUNTS:  YES  TOURNIQUET:   Total Tourniquet Time Documented: Thigh (Left) - 61 minutes Total: Thigh (Left) - 61 minutes   DICTATION: .Other Dictation: Dictation Number [id number]  PLAN OF CARE: Admit to inpatient   PATIENT DISPOSITION:  PACU - hemodynamically stable.   Delay start of Pharmacological VTE agent (>24hrs) due to surgical blood loss or risk of bleeding: no

## 2018-06-24 NOTE — Progress Notes (Signed)
Sandy on 3 E aware pt will be in 1301 in 20 minutes.

## 2018-06-24 NOTE — Op Note (Signed)
NAMENAJEE, COWENS MEDICAL RECORD [id number] ACCOUNT [id number] DATE OF BIRTH:08-Dec-1950 FACILITY: [facility] LOCATION: [facility] PHYSICIAN:Delaina Fetsch Maudie Mercury, MD  OPERATIVE REPORT  DATE OF PROCEDURE:  06/24/2018  PREOPERATIVE DIAGNOSIS:  End-stage degenerative joint disease, left knee.  POSTOPERATIVE DIAGNOSIS:  End-stage degenerative joint disease, left knee.  PROCEDURE:  Left total knee replacement with an Attune system, size 4 femur, size 4 tibia, 6 mm bridging bearing, and a 38 mm all polyethylene patella.  SURGEON:  Dorna Leitz, MD  ASSISTANT:  Gaspar Skeeters, PA.  ANESTHESIA:  Spinal.  BRIEF HISTORY:  The patient is a 67 year old female with a long history of severe bilateral knee problems.  She had a right total knee replacement that ultimately did well after a couple of arthroscopies for continuous grinding and popping.  After  failure of conservative care, she was taken to the operating room for left total knee replacement with a knee that showed medial subluxation of the femur on the tibia and significant medial side narrowing.  She was brought to the operating room for this  left total knee replacement.  DESCRIPTION OF PROCEDURE:  The patient taken to the operating room after adequate anesthesia was obtained while a spinal anesthetic placed.  The patient was placed supine on the operating table.  Left leg was prepped and draped in the usual sterile  fashion.  Following this, the leg was exsanguinated, blood pressure tourniquet inflated to 300 mmHg.  Following this, a midline incision was made, subcutaneous tissue down to the extensor mechanism.  A medial parapatellar arthrotomy was undertaken.  The  medial and lateral meniscus were removed, retropatellar fat pad, synovium in the anterior aspect of the femur, and the retropatellar fat pad.  At this point, attention was turned to the femur where an intramedullary pilot hole was drilled and a 4 degree  valgus inclination cut was made  with 9 mm of distal bone resected.  Following this, the femur sized to a 4.  Anterior and posterior cuts were made chamfers and box.  Attention was then turned to the tibia.  It was cut perpendicular to its long axis.   Rotation alignment was set.  It was drilled and keeled.  Then, a size 4 tibial implant is placed.  Trials were put in place.  Attention was turned to the patella where it was cut down to a level of 13 mm.  A 38 paddle was used and lugs were drilled for  this.  Once this was done, attention was turned to range of motion with trial components in and excellent range of motion and stability were achieved.  At this point, the trial components were removed.  Attention was then turned towards washing and  drying the knee and the final components were then cemented into place, size 4 femur, size 4 tibia, a size 5 mm bridging bearing trial was placed and a 38 all poly patella was placed and held with a clamp.  All excess bone cement was removed and the  cement allowed to completely harden.  Once cement was hardened, the tourniquet was let down, all bleeders controlled with electrocautery.  We trialed a #6 poly, I liked it a little bit better.  She had easy full extension and was good in flexion balance  as well.  A #6 poly was opened and placed.  The knee was again irrigated and 60 mL of Exparel with Marcaine and epinephrine were instilled throughout the synovial reflections of the knee for pain control.  At this  point, the medial parapatellar  arthrotomy was closed with #1 Vicryl running the skin with 0 and 2-0 Vicryl and 3-0 Monocryl subcuticular.  Benzoin and Steri-Strips were applied.  Sterile compressive dressing was applied and the patient was taken to recovery was noted to be in  satisfactory condition.  Estimated blood loss for the procedure was minimal.  TN/NUANCE  D:06/24/2018 T:06/24/2018 JOB:[id number]

## 2018-06-24 NOTE — Anesthesia Procedure Notes (Signed)
Procedure Name: MAC Date/Time: 06/24/2018 8:25 AM Performed by: Lissa Morales, CRNA Pre-anesthesia Checklist: Patient identified, Emergency Drugs available, Suction available and Patient being monitored Patient Re-evaluated:Patient Re-evaluated prior to induction Oxygen Delivery Method: Simple face mask Placement Confirmation: positive ETCO2

## 2018-06-24 NOTE — Evaluation (Signed)
Physical Therapy Evaluation Patient Details Name: Nicole Peters MRN: [id number] DOB: 26-Oct-1951 Today's Date: 06/24/2018   History of Present Illness  L TKA  Clinical Impression  The patient is progressing well. Ambulated x 60'. Plans Dc tomorrow after PT goals met. Pt admitted with above diagnosis. Pt currently with functional limitations due to the deficits listed below (see PT Problem List).  Pt will benefit from skilled PT to increase their independence and safety with mobility to allow discharge to the venue listed below.       Follow Up Recommendations Home health PT    Equipment Recommendations  None recommended by PT    Recommendations for Other Services       Precautions / Restrictions Precautions Precautions: Knee;Fall Required Braces or Orthoses: Knee Immobilizer - Left Knee Immobilizer - Left: Discontinue once straight leg raise with < 10 degree lag      Mobility  Bed Mobility Overal bed mobility: Needs Assistance Bed Mobility: Supine to Sit     Supine to sit: Min guard     General bed mobility comments: manages left leg  Transfers Overall transfer level: Needs assistance Equipment used: Rolling walker (2 wheeled) Transfers: Sit to/from Stand Sit to Stand: Min guard            Ambulation/Gait Ambulation/Gait assistance: Herbalist (Feet): 60 Feet Assistive device: Rolling walker (2 wheeled) Gait Pattern/deviations: Step-to pattern;Step-through pattern     General Gait Details: cues for sequence  Stairs            Wheelchair Mobility    Modified Rankin (Stroke Patients Only)       Balance                                             Pertinent Vitals/Pain Pain Assessment: 0-10 Pain Score: 3  Pain Location: left nee Pain Descriptors / Indicators: Discomfort;Sore Pain Intervention(s): Premedicated before session;Monitored during session;Ice applied    Home Living Family/patient expects to be  discharged to:: Private residence Living Arrangements: Spouse/significant other Available Help at Discharge: Family Type of Home: House Home Access: Stairs to enter Entrance Stairs-Rails: Right Entrance Stairs-Number of Steps: 2 Home Layout: One level Home Equipment: Environmental consultant - 2 wheels;Cane - single point      Prior Function Level of Independence: Independent with assistive device(s)               Hand Dominance        Extremity/Trunk Assessment   Upper Extremity Assessment Upper Extremity Assessment: Overall WFL for tasks assessed    Lower Extremity Assessment LLE Deficits / Details: 5-60 left knee flex. + SLR       Communication   Communication: No difficulties  Cognition Arousal/Alertness: Awake/alert Behavior During Therapy: WFL for tasks assessed/performed Overall Cognitive Status: Within Functional Limits for tasks assessed                                        General Comments      Exercises Total Joint Exercises Ankle Circles/Pumps: AROM;Both;10 reps Heel Slides: AROM;Left;5 reps;Supine Straight Leg Raises: AROM;Left;5 reps;Supine Long Arc Quad: AROM;Left;10 reps;Seated   Assessment/Plan    PT Assessment Patient needs continued PT services  PT Problem List Decreased strength;Decreased range of motion;Decreased activity tolerance;Decreased mobility;Decreased knowledge  of precautions;Decreased safety awareness;Decreased knowledge of use of DME;Pain       PT Treatment Interventions DME instruction;Therapeutic exercise;Gait training;Stair training;Functional mobility training;Therapeutic activities;Patient/family education    PT Goals (Current goals can be found in the Care Plan section)  Acute Rehab PT Goals Patient Stated Goal: to be done with this PT Goal Formulation: With patient/family Time For Goal Achievement: 06/27/18 Potential to Achieve Goals: Good    Frequency 7X/week   Barriers to discharge         Co-evaluation               AM-PAC PT "6 Clicks" Daily Activity  Outcome Measure Difficulty turning over in bed (including adjusting bedclothes, sheets and blankets)?: A Little Difficulty moving from lying on back to sitting on the side of the bed? : A Little Difficulty sitting down on and standing up from a chair with arms (e.g., wheelchair, bedside commode, etc,.)?: A Little Help needed moving to and from a bed to chair (including a wheelchair)?: A Little Help needed walking in hospital room?: A Little Help needed climbing 3-5 steps with a railing? : A Lot 6 Click Score: 17    End of Session Equipment Utilized During Treatment: Left knee immobilizer Activity Tolerance: Patient tolerated treatment well Patient left: with call bell/phone within reach;with family/visitor present Nurse Communication: Mobility status PT Visit Diagnosis: Unsteadiness on feet (R26.81);Pain Pain - Right/Left: Left Pain - part of body: Knee    Time: 0300-9233 PT Time Calculation (min) (ACUTE ONLY): 30 min   Charges:   PT Evaluation $PT Eval Low Complexity: 1 Low PT Treatments $Gait Training: 8-22 mins        [place] PT [phone]   [place], [place] 06/24/2018, 4:58 PM

## 2018-06-24 NOTE — Progress Notes (Signed)
[facility]  Patient Status: New  [facility] is providing the following services: PT  If patient discharges after hours, please call [phone].   Nicole Peters 06/24/2018, 10:04 AM

## 2018-06-24 NOTE — Discharge Instructions (Signed)

## 2018-06-24 NOTE — Anesthesia Postprocedure Evaluation (Signed)
Anesthesia Post Note  Patient: SHAKISHA ABEND  Procedure(s) Performed: LEFT TOTAL KNEE ARTHROPLASTY (Left Knee)     Patient location during evaluation: PACU Anesthesia Type: Spinal Level of consciousness: oriented and awake and alert Pain management: pain level controlled Vital Signs Assessment: post-procedure vital signs reviewed and stable Respiratory status: spontaneous breathing, respiratory function stable, patient connected to nasal cannula oxygen and nonlabored ventilation Cardiovascular status: blood pressure returned to baseline and stable Postop Assessment: no headache, no backache, no apparent nausea or vomiting and spinal receding Anesthetic complications: no    Last Vitals:  Vitals:   06/24/18 1030 06/24/18 1045  BP: (!) 144/78   Pulse: (!) 56   Resp: 16   Temp:    SpO2: 100% 100%    Last Pain:  Vitals:   06/24/18 1045  TempSrc:   PainSc: 0-No pain    LLE Motor Response: No movement due to regional block (06/24/18 1045) LLE Sensation: No sensation (absent) (06/24/18 1045)   RLE Sensation: Decreased (06/24/18 1045) L Sensory Level: L1-Inguinal (groin) region (06/24/18 1045) R Sensory Level: L1-Inguinal (groin) region (06/24/18 1045)  Mirha Brucato A.

## 2018-06-24 NOTE — Transfer of Care (Signed)
Immediate Anesthesia Transfer of Care Note  Patient: Nicole Peters  Procedure(s) Performed: LEFT TOTAL KNEE ARTHROPLASTY (Left Knee)  Patient Location: PACU  Anesthesia Type:Spinal  Level of Consciousness: awake, alert , oriented and patient cooperative  Airway & Oxygen Therapy: Patient Spontanous Breathing and Patient connected to face mask oxygen  Post-op Assessment: Report given to RN and Post -op Vital signs reviewed and stable  Post vital signs: stable  Last Vitals:  Vitals Value Taken Time  BP 143/79 06/24/2018  9:50 AM  Temp    Pulse    Resp 18 06/24/2018  9:53 AM  SpO2    Vitals shown include unvalidated device data.  Last Pain:  Vitals:   06/24/18 0551  TempSrc:   PainSc: 0-No pain      Patients Stated Pain Goal: 4 (42/37/02 3017)  Complications: No apparent anesthesia complications

## 2018-06-24 NOTE — Anesthesia Procedure Notes (Signed)
Spinal  Patient location during procedure: OR End time: 06/24/2018 7:34 AM Staffing Performed: anesthesiologist  Preanesthetic Checklist Completed: patient identified, site marked, surgical consent, pre-op evaluation, timeout performed, IV checked, risks and benefits discussed and monitors and equipment checked Spinal Block Patient position: sitting Prep: DuraPrep Patient monitoring: heart rate, continuous pulse ox and blood pressure Approach: midline Location: L4-5 Injection technique: single-shot Needle Needle type: Pencan  Needle gauge: 24 G Needle length: 9 cm Additional Notes Expiration date of kit checked and confirmed. Patient tolerated procedure well, without complications.

## 2018-06-24 NOTE — Anesthesia Procedure Notes (Signed)
Anesthesia Regional Block: Adductor canal block   Pre-Anesthetic Checklist: ,, timeout performed, Correct Patient, Correct Site, Correct Laterality, Correct Procedure, Correct Position, site marked, Risks and benefits discussed,  Surgical consent,  Pre-op evaluation,  At surgeon's request and post-op pain management  Laterality: Left  Prep: chloraprep       Needles:  Injection technique: Single-shot  Needle Type: Echogenic Stimulator Needle     Needle Length: 9cm  Needle Gauge: 21   Needle insertion depth: 6 cm   Additional Needles:   Procedures:,,,, ultrasound used (permanent image in chart),,,,  Narrative:  Start time: 06/24/2018 7:03 AM End time: 06/24/2018 7:08 AM Injection made incrementally with aspirations every 5 mL.  Performed by: Personally  Anesthesiologist: Josephine Igo, MD  Additional Notes: Timeout performed. Patient sedated. Relevant anatomy ID'd using [place]. Incremental 2-13ml injection of LA with frequent aspiration. Patient tolerated procedure well.       Left Adductor Canal Block

## 2018-06-24 NOTE — Progress Notes (Signed)
Care Plan Notes 03/26/2018 to 06/24/2018       Care Plan by Ladell Heads at 06/16/2018 12:17 PM    Date of Service   Author Author Type Status Note Type File Time  06/16/2018  [place], [place] 06/16/2018             Spoke with paitent prior to surgery. She will discharge to home with HHPT and the following:   DME:  No needs  HHP: [facility]  OPPT:  [facility] [address] 07/11/18  MD follow up: 07/11/18 @ 1000.   Please contact Ladell Heads, [place], [phone], with questions or if this plan needs to change.   Thanks

## 2018-06-25 DIAGNOSIS — M1712 Unilateral primary osteoarthritis, left knee: Secondary | ICD-10-CM | POA: Diagnosis not present

## 2018-06-25 DIAGNOSIS — E7800 Pure hypercholesterolemia, unspecified: Secondary | ICD-10-CM | POA: Diagnosis not present

## 2018-06-25 DIAGNOSIS — F329 Major depressive disorder, single episode, unspecified: Secondary | ICD-10-CM | POA: Diagnosis not present

## 2018-06-25 DIAGNOSIS — I10 Essential (primary) hypertension: Secondary | ICD-10-CM | POA: Diagnosis not present

## 2018-06-25 DIAGNOSIS — F419 Anxiety disorder, unspecified: Secondary | ICD-10-CM | POA: Diagnosis not present

## 2018-06-25 DIAGNOSIS — M810 Age-related osteoporosis without current pathological fracture: Secondary | ICD-10-CM | POA: Diagnosis not present

## 2018-06-25 LAB — BASIC METABOLIC PANEL
ANION GAP: 10 (ref 5–15)
BUN: 15 mg/dL (ref 8–23)
CALCIUM: 9 mg/dL (ref 8.9–10.3)
CO2: 27 mmol/L (ref 22–32)
Chloride: 104 mmol/L (ref 98–111)
Creatinine, Ser: 0.89 mg/dL (ref 0.44–1.00)
Glucose, Bld: 151 mg/dL — ABNORMAL HIGH (ref 70–99)
POTASSIUM: 4.5 mmol/L (ref 3.5–5.1)
Sodium: 141 mmol/L (ref 135–145)

## 2018-06-25 LAB — CBC
HEMATOCRIT: 38.8 % (ref 36.0–46.0)
Hemoglobin: 12.9 g/dL (ref 12.0–15.0)
MCH: 30.6 pg (ref 26.0–34.0)
MCHC: 33.2 g/dL (ref 30.0–36.0)
MCV: 91.9 fL (ref 78.0–100.0)
PLATELETS: 187 10*3/uL (ref 150–400)
RBC: 4.22 MIL/uL (ref 3.87–5.11)
RDW: 12.9 % (ref 11.5–15.5)
WBC: 15.3 10*3/uL — AB (ref 4.0–10.5)

## 2018-06-25 NOTE — Plan of Care (Signed)
Pt to discharge home today with [facility]

## 2018-06-25 NOTE — Progress Notes (Signed)
Contacted [facility] to make aware of discharge home today. Jonnie Finner RN CCM Case Mgmt phone [phone]

## 2018-06-25 NOTE — Progress Notes (Signed)
Subjective: 1 Day Post-Op Procedure(s) (LRB): LEFT TOTAL KNEE ARTHROPLASTY (Left)   Patient doing very well and wishes to go home today.  Activity level:  wbat Diet tolerance:  ok Voiding:  ok Patient reports pain as mild.    Objective: Vital signs in last 24 hours: Temp:  [97.4 F (36.3 C)-98.7 F (37.1 C)] 98 F (36.7 C) (08/03 0604) Pulse Rate:  [56-92] 76 (08/03 0604) Resp:  [10-18] 16 (08/03 0604) BP: (137-186)/(66-103) 174/89 (08/03 0604) SpO2:  [94 %-100 %] 100 % (08/03 0604)  Labs: Recent Labs    06/25/18 0541  HGB 12.9   Recent Labs    06/25/18 0541  WBC 15.3*  RBC 4.22  HCT 38.8  PLT 187   Recent Labs    06/25/18 0541  NA 141  K 4.5  CL 104  CO2 27  BUN 15  CREATININE 0.89  GLUCOSE 151*  CALCIUM 9.0   No results for input(s): LABPT, INR in the last 72 hours.  Physical Exam:  Neurologically intact ABD soft Neurovascular intact Sensation intact distally Intact pulses distally Dorsiflexion/Plantar flexion intact Incision: dressing C/D/I and no drainage No cellulitis present Compartment soft  Assessment/Plan:  1 Day Post-Op Procedure(s) (LRB): LEFT TOTAL KNEE ARTHROPLASTY (Left) Advance diet Up with therapy D/C IV fluids Discharge home with home health Today after PT. Continue on ASA 325mg  BID for DVT prevention. Follow up in office with Dr. Berenice Primas 2 weeks post op. Anticipated LOS equal to or greater than 2 midnights due to - Age 65 and older with one or more of the following:  - Obesity  - Expected need for hospital services (PT, OT, Nursing) required for safe  discharge  - Anticipated need for postoperative skilled nursing care or inpatient rehab  - Active co-morbidities: None OR   - Unanticipated findings during/Post Surgery: Slow post-op progression: GI, pain control, mobility  - Patient is a high risk of re-admission due to: None  Jamaris Biernat PAUL 06/25/2018, 8:01 AM

## 2018-06-25 NOTE — Progress Notes (Signed)
Patient dc'd home. She left the floor in stable condition, accompanied by staff, with all belongings. She verbalized discharge instructions. IV removed. Rx given.

## 2018-06-25 NOTE — Progress Notes (Signed)
   06/25/18 1337  PT Visit Information  Last PT Received On 06/25/18  Assistance Needed +1  Precautions  Precautions Knee;Fall  Pain Assessment  Pain Score 1  Pain Location left nee  Pain Descriptors / Indicators Discomfort;Sore  Pain Intervention(s) Monitored during session  Cognition  Arousal/Alertness Awake/alert  Bed Mobility  General bed mobility comments in recliner  Transfers  Equipment used Rolling walker (2 wheeled)  Transfers Sit to/from Stand  Sit to Stand Supervision  General transfer comment cues for hand placement, push from bed, slightly impulsive  Ambulation/Gait  Ambulation/Gait assistance Min guard  Gait Distance (Feet) 50 Feet  Assistive device Rolling walker (2 wheeled)  Gait Pattern/deviations Step-to pattern;Step-through pattern  General Gait Details cues for sequence  Stairs Yes  Stairs assistance Min guard  Stair Management One rail Right;With cane  Number of Stairs 2  PT - End of Session  Activity Tolerance Patient tolerated treatment well  Patient left with call bell/phone within reach  Nurse Communication Mobility status   PT - Assessment/Plan  PT Plan Current plan remains appropriate  PT Visit Diagnosis Unsteadiness on feet (R26.81);Pain  Pain - Right/Left Left  Pain - part of body Knee  PT Frequency (ACUTE ONLY) 7X/week  Follow Up Recommendations Home health PT  PT equipment None recommended by PT  AM-PAC PT "6 Clicks" Daily Activity Outcome Measure  Difficulty turning over in bed (including adjusting bedclothes, sheets and blankets)? 4  Difficulty moving from lying on back to sitting on the side of the bed?  4  Difficulty sitting down on and standing up from a chair with arms (e.g., wheelchair, bedside commode, etc,.)? 3  Help needed moving to and from a bed to chair (including a wheelchair)? 3  Help needed walking in hospital room? 3  Help needed climbing 3-5 steps with a railing?  3  6 Click Score 20  Mobility G Code  CJ  PT Time  Calculation  PT Start Time (ACUTE ONLY) 1219  PT Stop Time (ACUTE ONLY) 1230  PT Time Calculation (min) (ACUTE ONLY) 11 min  PT General Charges  $$ ACUTE PT VISIT 1 Visit  PT Treatments  $Gait Training 8-22 mins

## 2018-06-25 NOTE — Progress Notes (Signed)
   06/25/18 1000  PT Visit Information  Last PT Received On 06/25/18  Assistance Needed +1  History of Present Illness L TKA  Precautions  Precautions Knee;Fall  Required Braces or Orthoses  (did not require KI)  Pain Assessment  Pain Assessment 0-10  Pain Score 1  Pain Location left nee  Pain Descriptors / Indicators Discomfort;Sore  Pain Intervention(s) Premedicated before session;Ice applied  Cognition  Arousal/Alertness Awake/alert  Bed Mobility  Overal bed mobility Needs Assistance  Bed Mobility Supine to Sit  Supine to sit Supervision  General bed mobility comments manages left leg  Transfers  Overall transfer level Needs assistance  Equipment used Rolling walker (2 wheeled)  Transfers Sit to/from Stand  Sit to Stand Min guard  General transfer comment cues for hand placement, push from bed, slightly impulsive  Ambulation/Gait  Ambulation/Gait assistance Min guard  Gait Distance (Feet) 120 Feet  Assistive device Rolling walker (2 wheeled)  Gait Pattern/deviations Step-to pattern;Step-through pattern  General Gait Details cues for sequence  Total Joint Exercises  Ankle Circles/Pumps AROM;Both;10 reps  Long Arc Quad AROM;Left;10 reps;Seated  Straight Leg Raises AROM;Left;Supine;10 reps  PT - End of Session  Equipment Utilized During Treatment  Gait belt  Activity Tolerance Patient tolerated treatment well  Patient left with call bell/phone within reach  Nurse Communication Mobility status   PT - Assessment/Plan  PT Plan Current plan remains appropriate  PT Visit Diagnosis Unsteadiness on feet (R26.81);Pain  Pain - Right/Left Left  Pain - part of body Knee  PT Frequency (ACUTE ONLY) 7X/week  Follow Up Recommendations Home health PT  PT equipment None recommended by PT  AM-PAC PT "6 Clicks" Daily Activity Outcome Measure  Difficulty turning over in bed (including adjusting bedclothes, sheets and blankets)? 4  Difficulty moving from lying on back to sitting on  the side of the bed?  4  Difficulty sitting down on and standing up from a chair with arms (e.g., wheelchair, bedside commode, etc,.)? 3  Help needed moving to and from a bed to chair (including a wheelchair)? 3  Help needed walking in hospital room? 3  Help needed climbing 3-5 steps with a railing?  3  6 Click Score 20  Mobility G Code  CJ  PT Time Calculation  PT Start Time (ACUTE ONLY) 0847  PT Stop Time (ACUTE ONLY) 0925  PT Time Calculation (min) (ACUTE ONLY) 38 min  PT General Charges  $$ ACUTE PT VISIT 1 Visit  PT Treatments  $Gait Training 8-22 mins  $Therapeutic Exercise 8-22 mins  $Self Care/Home Management 8-22  [facility] PT [phone] Gait 1, There ex 1 self care 1

## 2018-06-25 NOTE — Discharge Summary (Signed)
Patient ID: Nicole Peters MRN: [id number] DOB/AGE: 01-09-1951 67 y.o.  Admit date: 06/24/2018 Discharge date: 06/25/2018  Admission Diagnoses:  Principal Problem:   Primary osteoarthritis of left knee   Discharge Diagnoses:  Same  Past Medical History:  Diagnosis Date  . Allergy   . Anxiety   . Arthritis   . Depression   . History of bronchitis   . Hyperlipidemia   . Hypertension   . Obesity   . Osteoporosis   . Stress incontinence     Surgeries: Procedure(s): LEFT TOTAL KNEE ARTHROPLASTY on 06/24/2018   Consultants:   Discharged Condition: Improved  Hospital Course: MADELL HEINO is an 67 y.o. female who was admitted 06/24/2018 for operative treatment ofPrimary osteoarthritis of left knee. Patient has severe unremitting pain that affects sleep, daily activities, and work/hobbies. After pre-op clearance the patient was taken to the operating room on 06/24/2018 and underwent  Procedure(s): LEFT TOTAL KNEE ARTHROPLASTY.    Patient was given perioperative antibiotics:  Anti-infectives (From admission, onward)   Start     Dose/Rate Route Frequency Ordered Stop   06/24/18 1400  ceFAZolin (ANCEF) IVPB 2g/100 mL premix     2 g 200 mL/hr over 30 Minutes Intravenous Every 6 hours 06/24/18 1129 06/24/18 2104   06/24/18 0600  ceFAZolin (ANCEF) IVPB 2g/100 mL premix     2 g 200 mL/hr over 30 Minutes Intravenous On call to O.R. 06/24/18 0533 06/24/18 0086       Patient was given sequential compression devices, early ambulation, and chemoprophylaxis to prevent DVT.  Patient benefited maximally from hospital stay and there were no complications.    Recent vital signs:  Patient Vitals for the past 24 hrs:  BP Temp Temp src Pulse Resp SpO2  06/25/18 0604 (!) 174/89 98 F (36.7 C) Oral 76 16 100 %  06/24/18 2002 (!) 171/98 (!) 97.5 F (36.4 C) Oral 75 16 -  06/24/18 1654 (!) 186/91 98.3 F (36.8 C) Oral 92 16 94 %  06/24/18 1307 (!) 175/84 98.7 F (37.1 C) - 68 - 99 %   06/24/18 1209 (!) 160/66 - - 60 - 100 %  06/24/18 1110 (!) 174/103 (!) 97.4 F (36.3 C) - 64 18 96 %  06/24/18 1045 137/72 97.8 F (36.6 C) - (!) 59 - 100 %  06/24/18 1030 (!) 144/78 - - (!) 56 16 100 %  06/24/18 1015 - - - 68 12 97 %  06/24/18 1004 (!) 142/88 - - 64 10 99 %  06/24/18 1000 - - - 68 16 100 %  06/24/18 0951 - - - - - 100 %  06/24/18 0950 - 98.4 F (36.9 C) - 68 - 97 %     Recent laboratory studies:  Recent Labs    06/25/18 0541  WBC 15.3*  HGB 12.9  HCT 38.8  PLT 187  NA 141  K 4.5  CL 104  CO2 27  BUN 15  CREATININE 0.89  GLUCOSE 151*  CALCIUM 9.0     Discharge Medications:   Allergies as of 06/25/2018   No Known Allergies     Medication List    STOP taking these medications   ibuprofen 200 MG tablet Commonly known as:  ADVIL,MOTRIN     TAKE these medications   alendronate 70 MG tablet Commonly known as:  FOSAMAX Take 1 tablet (70 mg total) by mouth every 7 (seven) days. Take with a full glass of water on an empty stomach.   ALPRAZolam  0.25 MG tablet Commonly known as:  XANAX TAKE 1 TO 1& 1/2 TABLETS BY MOUTH DAILY What changed:    how much to take  how to take this  when to take this  additional instructions   aspirin EC 325 MG tablet Take 1 tablet (325 mg total) by mouth 2 (two) times daily after a meal. Take x 1 month post op to decrease risk of blood clots. What changed:    medication strength  how much to take  when to take this  additional instructions   benazepril 20 MG tablet Commonly known as:  LOTENSIN Take 1 tablet (20 mg total) by mouth daily.   buPROPion 300 MG 24 hr tablet Commonly known as:  WELLBUTRIN XL Take 1 tablet (300 mg total) by mouth daily.   cetirizine 10 MG tablet Commonly known as:  ZYRTEC Take 10 mg by mouth daily as needed for allergies.   docusate sodium 100 MG capsule Commonly known as:  COLACE Take 1 capsule (100 mg total) by mouth 2 (two) times daily.   HYDROcodone-acetaminophen  5-325 MG tablet Commonly known as:  NORCO Take 1-2 tablets by mouth every 6 (six) hours as needed for moderate pain.   multivitamin tablet Take 1 tablet by mouth daily.   pravastatin 80 MG tablet Commonly known as:  PRAVACHOL Take 1 tablet (80 mg total) by mouth at bedtime.   tiZANidine 2 MG tablet Commonly known as:  ZANAFLEX Take 1 tablet (2 mg total) by mouth every 8 (eight) hours as needed for muscle spasms.   Vitamin D3 2000 units capsule Take 2,000 Units by mouth daily.       Diagnostic Studies: Dg Chest 2 View  Result Date: 06/17/2018 CLINICAL DATA:  Preoperative total knee replacement.  Hypertension. EXAM: CHEST - 2 VIEW COMPARISON:  March 05, 2016 FINDINGS: There are areas of slight scarring in each lower lobe. There is no edema or consolidation. The heart size and pulmonary vascularity are normal. No adenopathy. There is degenerative change in the thoracic spine. IMPRESSION: Areas of mild scarring in each lower lobe. No edema or consolidation. Stable cardiac silhouette. Electronically Signed   By: Lowella Grip III M.D.   On: 06/17/2018 11:05    Disposition: Discharge disposition: 01-Home or Self Care       Discharge Instructions    Call MD / Call 911   Complete by:  As directed    If you experience chest pain or shortness of breath, CALL 911 and be transported to the hospital emergency room.  If you develope a fever above 101 F, pus (white drainage) or increased drainage or redness at the wound, or calf pain, call your surgeon's office.   Constipation Prevention   Complete by:  As directed    Drink plenty of fluids.  Prune juice may be helpful.  You may use a stool softener, such as Colace (over the counter) 100 mg twice a day.  Use MiraLax (over the counter) for constipation as needed.   Diet - low sodium heart healthy   Complete by:  As directed    Discharge instructions   Complete by:  As directed    INSTRUCTIONS AFTER JOINT REPLACEMENT   Remove items  at home which could result in a fall. This includes throw rugs or furniture in walking pathways ICE to the affected joint every three hours while awake for 30 minutes at a time, for at least the first 3-5 days, and then as needed for pain and  swelling.  Continue to use ice for pain and swelling. You may notice swelling that will progress down to the foot and ankle.  This is normal after surgery.  Elevate your leg when you are not up walking on it.   Continue to use the breathing machine you got in the hospital (incentive spirometer) which will help keep your temperature down.  It is common for your temperature to cycle up and down following surgery, especially at night when you are not up moving around and exerting yourself.  The breathing machine keeps your lungs expanded and your temperature down.   DIET:  As you were doing prior to hospitalization, we recommend a well-balanced diet.  DRESSING / WOUND CARE / SHOWERING  You may shower 3 days after surgery, but keep the wounds dry during showering.  You may use an occlusive plastic wrap (Press'n Seal for example), NO SOAKING/SUBMERGING IN THE BATHTUB.  If the bandage gets wet, change with a clean dry gauze.  If the incision gets wet, pat the wound dry with a clean towel.  ACTIVITY  Increase activity slowly as tolerated, but follow the weight bearing instructions below.   No driving for 6 weeks or until further direction given by your physician.  You cannot drive while taking narcotics.  No lifting or carrying greater than 10 lbs. until further directed by your surgeon. Avoid periods of inactivity such as sitting longer than an hour when not asleep. This helps prevent blood clots.  You may return to work once you are authorized by your doctor.     WEIGHT BEARING   Weight bearing as tolerated with assist device (walker, cane, etc) as directed, use it as long as suggested by your surgeon or therapist, typically at least 4-6  weeks.   EXERCISES  Results after joint replacement surgery are often greatly improved when you follow the exercise, range of motion and muscle strengthening exercises prescribed by your doctor. Safety measures are also important to protect the joint from further injury. Any time any of these exercises cause you to have increased pain or swelling, decrease what you are doing until you are comfortable again and then slowly increase them. If you have problems or questions, call your caregiver or physical therapist for advice.   Rehabilitation is important following a joint replacement. After just a few days of immobilization, the muscles of the leg can become weakened and shrink (atrophy).  These exercises are designed to build up the tone and strength of the thigh and leg muscles and to improve motion. Often times heat used for twenty to thirty minutes before working out will loosen up your tissues and help with improving the range of motion but do not use heat for the first two weeks following surgery (sometimes heat can increase post-operative swelling).   These exercises can be done on a training (exercise) mat, on the floor, on a table or on a bed. Use whatever works the best and is most comfortable for you.    Use music or television while you are exercising so that the exercises are a pleasant break in your day. This will make your life better with the exercises acting as a break in your routine that you can look forward to.   Perform all exercises about fifteen times, three times per day or as directed.  You should exercise both the operative leg and the other leg as well.   Exercises include:   Quad Sets - Tighten up the muscle on  the front of the thigh (Quad) and hold for 5-10 seconds.   Straight Leg Raises - With your knee straight (if you were given a brace, keep it on), lift the leg to 60 degrees, hold for 3 seconds, and slowly lower the leg.  Perform this exercise against resistance later  as your leg gets stronger.  Leg Slides: Lying on your back, slowly slide your foot toward your buttocks, bending your knee up off the floor (only go as far as is comfortable). Then slowly slide your foot back down until your leg is flat on the floor again.  Angel Wings: Lying on your back spread your legs to the side as far apart as you can without causing discomfort.  Hamstring Strength:  Lying on your back, push your heel against the floor with your leg straight by tightening up the muscles of your buttocks.  Repeat, but this time bend your knee to a comfortable angle, and push your heel against the floor.  You may put a pillow under the heel to make it more comfortable if necessary.   A rehabilitation program following joint replacement surgery can speed recovery and prevent re-injury in the future due to weakened muscles. Contact your doctor or a physical therapist for more information on knee rehabilitation.    CONSTIPATION  Constipation is defined medically as fewer than three stools per week and severe constipation as less than one stool per week.  Even if you have a regular bowel pattern at home, your normal regimen is likely to be disrupted due to multiple reasons following surgery.  Combination of anesthesia, postoperative narcotics, change in appetite and fluid intake all can affect your bowels.   YOU MUST use at least one of the following options; they are listed in order of increasing strength to get the job done.  They are all available over the counter, and you may need to use some, POSSIBLY even all of these options:    Drink plenty of fluids (prune juice may be helpful) and high fiber foods Colace 100 mg by mouth twice a day  Senokot for constipation as directed and as needed Dulcolax (bisacodyl), take with full glass of water  Miralax (polyethylene glycol) once or twice a day as needed.  If you have tried all these things and are unable to have a bowel movement in the first 3-4  days after surgery call either your surgeon or your primary doctor.    If you experience loose stools or diarrhea, hold the medications until you stool forms back up.  If your symptoms do not get better within 1 week or if they get worse, check with your doctor.  If you experience "the worst abdominal pain ever" or develop nausea or vomiting, please contact the office immediately for further recommendations for treatment.   ITCHING:  If you experience itching with your medications, try taking only a single pain pill, or even half a pain pill at a time.  You can also use Benadryl over the counter for itching or also to help with sleep.   TED HOSE STOCKINGS:  Use stockings on both legs until for at least 2 weeks or as directed by physician office. They may be removed at night for sleeping.  MEDICATIONS:  See your medication summary on the "After Visit Summary" that nursing will review with you.  You may have some home medications which will be placed on hold until you complete the course of blood thinner medication.  It is important  for you to complete the blood thinner medication as prescribed.  PRECAUTIONS:  If you experience chest pain or shortness of breath - call 911 immediately for transfer to the hospital emergency department.   If you develop a fever greater that 101 F, purulent drainage from wound, increased redness or drainage from wound, foul odor from the wound/dressing, or calf pain - CONTACT YOUR SURGEON.                                                   FOLLOW-UP APPOINTMENTS:  If you do not already have a post-op appointment, please call the office for an appointment to be seen by your surgeon.  Guidelines for how soon to be seen are listed in your "After Visit Summary", but are typically between 1-4 weeks after surgery.  OTHER INSTRUCTIONS:   Knee Replacement:  Do not place pillow under knee, focus on keeping the knee straight while resting. CPM instructions: 0-90 degrees, 2 hours  in the morning, 2 hours in the afternoon, and 2 hours in the evening. Place foam block, curve side up under heel at all times except when in CPM or when walking.  DO NOT modify, tear, cut, or change the foam block in any way.  MAKE SURE YOU:  Understand these instructions.  Get help right away if you are not doing well or get worse.    Thank you for letting us be a part of your medical care team.  It is a privilege we respect greatly.  We hope these instructions will help you stay on track for a fast and full recovery!   Increase activity slowly as tolerated   Complete by:  As directed       Follow-up Information    Dorna Leitz, MD. Schedule an appointment as soon as possible for a visit in 2 weeks.   Specialty:  Orthopedic Surgery Contact information: [place] [address] [phone]            Signed: Rich Fuchs 06/25/2018, 8:03 AM

## 2018-06-26 DIAGNOSIS — Z96652 Presence of left artificial knee joint: Secondary | ICD-10-CM | POA: Diagnosis not present

## 2018-06-26 DIAGNOSIS — I10 Essential (primary) hypertension: Secondary | ICD-10-CM | POA: Diagnosis not present

## 2018-06-26 DIAGNOSIS — E7800 Pure hypercholesterolemia, unspecified: Secondary | ICD-10-CM | POA: Diagnosis not present

## 2018-06-26 DIAGNOSIS — M1711 Unilateral primary osteoarthritis, right knee: Secondary | ICD-10-CM | POA: Diagnosis not present

## 2018-06-26 DIAGNOSIS — F419 Anxiety disorder, unspecified: Secondary | ICD-10-CM | POA: Diagnosis not present

## 2018-06-26 DIAGNOSIS — F329 Major depressive disorder, single episode, unspecified: Secondary | ICD-10-CM | POA: Diagnosis not present

## 2018-06-26 DIAGNOSIS — E669 Obesity, unspecified: Secondary | ICD-10-CM | POA: Diagnosis not present

## 2018-06-26 DIAGNOSIS — M810 Age-related osteoporosis without current pathological fracture: Secondary | ICD-10-CM | POA: Diagnosis not present

## 2018-06-26 DIAGNOSIS — Z471 Aftercare following joint replacement surgery: Secondary | ICD-10-CM | POA: Diagnosis not present

## 2018-06-27 ENCOUNTER — Encounter (HOSPITAL_COMMUNITY): Payer: Self-pay | Admitting: Orthopedic Surgery

## 2018-06-28 DIAGNOSIS — F419 Anxiety disorder, unspecified: Secondary | ICD-10-CM | POA: Diagnosis not present

## 2018-06-28 DIAGNOSIS — M810 Age-related osteoporosis without current pathological fracture: Secondary | ICD-10-CM | POA: Diagnosis not present

## 2018-06-28 DIAGNOSIS — Z471 Aftercare following joint replacement surgery: Secondary | ICD-10-CM | POA: Diagnosis not present

## 2018-06-28 DIAGNOSIS — M1711 Unilateral primary osteoarthritis, right knee: Secondary | ICD-10-CM | POA: Diagnosis not present

## 2018-06-28 DIAGNOSIS — I10 Essential (primary) hypertension: Secondary | ICD-10-CM | POA: Diagnosis not present

## 2018-06-28 DIAGNOSIS — F329 Major depressive disorder, single episode, unspecified: Secondary | ICD-10-CM | POA: Diagnosis not present

## 2018-06-30 ENCOUNTER — Telehealth: Payer: Self-pay | Admitting: Family Medicine

## 2018-06-30 DIAGNOSIS — F329 Major depressive disorder, single episode, unspecified: Secondary | ICD-10-CM | POA: Diagnosis not present

## 2018-06-30 DIAGNOSIS — I10 Essential (primary) hypertension: Secondary | ICD-10-CM | POA: Diagnosis not present

## 2018-06-30 DIAGNOSIS — M1711 Unilateral primary osteoarthritis, right knee: Secondary | ICD-10-CM | POA: Diagnosis not present

## 2018-06-30 DIAGNOSIS — M810 Age-related osteoporosis without current pathological fracture: Secondary | ICD-10-CM | POA: Diagnosis not present

## 2018-06-30 DIAGNOSIS — F419 Anxiety disorder, unspecified: Secondary | ICD-10-CM | POA: Diagnosis not present

## 2018-06-30 DIAGNOSIS — Z471 Aftercare following joint replacement surgery: Secondary | ICD-10-CM | POA: Diagnosis not present

## 2018-06-30 NOTE — Telephone Encounter (Signed)
Copied from [place] [id number]. Topic: Inquiry >> Jun 30, 2018  2:10 PM Oliver Pila B wrote: Reason for CRM: [facility] called to inform new pcp Dr. Pamella Pert that they were at the pt's home this morning and while walking the pt's oxygen dropped to 90%; pt gets out of breathe very easily; but being w/ pt this was very alarming; [facility] would like to have the pcp/nurse contact pt to have the pt come in earlier for an appt rather than waiting until October; contact pt to advise

## 2018-07-04 DIAGNOSIS — Z471 Aftercare following joint replacement surgery: Secondary | ICD-10-CM | POA: Diagnosis not present

## 2018-07-04 DIAGNOSIS — F419 Anxiety disorder, unspecified: Secondary | ICD-10-CM | POA: Diagnosis not present

## 2018-07-04 DIAGNOSIS — F329 Major depressive disorder, single episode, unspecified: Secondary | ICD-10-CM | POA: Diagnosis not present

## 2018-07-04 DIAGNOSIS — M1711 Unilateral primary osteoarthritis, right knee: Secondary | ICD-10-CM | POA: Diagnosis not present

## 2018-07-04 DIAGNOSIS — I10 Essential (primary) hypertension: Secondary | ICD-10-CM | POA: Diagnosis not present

## 2018-07-04 DIAGNOSIS — M810 Age-related osteoporosis without current pathological fracture: Secondary | ICD-10-CM | POA: Diagnosis not present

## 2018-07-05 ENCOUNTER — Telehealth: Payer: Self-pay | Admitting: Family Medicine

## 2018-07-05 NOTE — Telephone Encounter (Signed)
Called pt. To reschedule appt. On 09/16/18. Rescheduled.

## 2018-07-06 DIAGNOSIS — Z471 Aftercare following joint replacement surgery: Secondary | ICD-10-CM | POA: Diagnosis not present

## 2018-07-06 DIAGNOSIS — I10 Essential (primary) hypertension: Secondary | ICD-10-CM | POA: Diagnosis not present

## 2018-07-06 DIAGNOSIS — M810 Age-related osteoporosis without current pathological fracture: Secondary | ICD-10-CM | POA: Diagnosis not present

## 2018-07-06 DIAGNOSIS — F329 Major depressive disorder, single episode, unspecified: Secondary | ICD-10-CM | POA: Diagnosis not present

## 2018-07-06 DIAGNOSIS — M1711 Unilateral primary osteoarthritis, right knee: Secondary | ICD-10-CM | POA: Diagnosis not present

## 2018-07-06 DIAGNOSIS — F419 Anxiety disorder, unspecified: Secondary | ICD-10-CM | POA: Diagnosis not present

## 2018-07-06 NOTE — Telephone Encounter (Signed)
Spoke with patient she states she is feeling fine. And her O2 has gone right back up to 96 98 every time.  She states she starts her physical therapy next week and has post op tomorrow for her knee .   Advised patient she should be seen if she has shortness of breath .  She states the only time it happens is when she was outside doing exercises and talking in the humidity and once they came back in she was fine.    Patient advised of risk and voiced understanding

## 2018-07-07 DIAGNOSIS — M1712 Unilateral primary osteoarthritis, left knee: Secondary | ICD-10-CM | POA: Diagnosis not present

## 2018-07-13 ENCOUNTER — Telehealth: Payer: Medicare Other | Admitting: Nurse Practitioner

## 2018-07-13 DIAGNOSIS — R05 Cough: Secondary | ICD-10-CM

## 2018-07-13 DIAGNOSIS — R059 Cough, unspecified: Secondary | ICD-10-CM

## 2018-07-13 MED ORDER — BENZONATATE 100 MG PO CAPS: 100.0000 mg | ORAL_CAPSULE | Freq: Three times a day (TID) | ORAL | 0 refills | Status: DC | PRN

## 2018-07-13 MED ORDER — AZITHROMYCIN 250 MG PO TABS: ORAL_TABLET | ORAL | 0 refills | Status: DC

## 2018-07-13 MED ORDER — PREDNISONE 20 MG PO TABS: ORAL_TABLET | ORAL | 0 refills | Status: DC

## 2018-07-13 NOTE — Progress Notes (Signed)
We are sorry that you are not feeling well.  Here is how we plan to help!  Based on your presentation I believe you most likely have A cough due to bacteria.  When patients have a fever and a productive cough with a change in color or increased sputum production, we are concerned about bacterial bronchitis.  If left untreated it can progress to pneumonia.  If your symptoms do not improve with your treatment plan it is important that you contact your provider.   I have prescribed Azithromyin 250 mg: two tablets now and then one tablet daily for 4 additonal days    In addition you may use A prescription cough medication called Tessalon Perles 100mg . You may take 1-2 capsules every 8 hours as needed for your cough.  Prednisone 20mg  2 po daily for 5 days  From your responses in the eVisit questionnaire you describe inflammation in the upper respiratory tract which is causing a significant cough.  This is commonly called Bronchitis and has four common causes:    Allergies  Viral Infections  Acid Reflux  Bacterial Infection Allergies, viruses and acid reflux are treated by controlling symptoms or eliminating the cause. An example might be a cough caused by taking certain blood pressure medications. You stop the cough by changing the medication. Another example might be a cough caused by acid reflux. Controlling the reflux helps control the cough.  USE OF BRONCHODILATOR ("RESCUE") INHALERS: There is a risk from using your bronchodilator too frequently.  The risk is that over-reliance on a medication which only relaxes the muscles surrounding the breathing tubes can reduce the effectiveness of medications prescribed to reduce swelling and congestion of the tubes themselves.  Although you feel brief relief from the bronchodilator inhaler, your asthma may actually be worsening with the tubes becoming more swollen and filled with mucus.  This can delay other crucial treatments, such as oral steroid  medications. If you need to use a bronchodilator inhaler daily, several times per day, you should discuss this with your provider.  There are probably better treatments that could be used to keep your asthma under control.     HOME CARE . Only take medications as instructed by your medical team. . Complete the entire course of an antibiotic. . Drink plenty of fluids and get plenty of rest. . Avoid close contacts especially the very young and the elderly . Cover your mouth if you cough or cough into your sleeve. . Always remember to wash your hands . A steam or ultrasonic humidifier can help congestion.   GET HELP RIGHT AWAY IF: . You develop worsening fever. . You become short of breath . You cough up blood. . Your symptoms persist after you have completed your treatment plan MAKE SURE YOU   Understand these instructions.  Will watch your condition.  Will get help right away if you are not doing well or get worse.  Your e-visit answers were reviewed by a board certified advanced clinical practitioner to complete your personal care plan.  Depending on the condition, your plan could have included both over the counter or prescription medications. If there is a problem please reply  once you have received a response from your provider. Your safety is important to [place].  If you have drug allergies check your prescription carefully.    You can use MyChart to ask questions about today's visit, request a non-urgent call back, or ask for a work or school excuse for 24 hours  related to this e-Visit. If it has been greater than 24 hours you will need to follow up with your provider, or enter a new e-Visit to address those concerns. You will get an e-mail in the next two days asking about your experience.  I hope that your e-visit has been valuable and will speed your recovery. Thank you for using e-visits.   

## 2018-07-18 DIAGNOSIS — M25562 Pain in left knee: Secondary | ICD-10-CM | POA: Diagnosis not present

## 2018-07-18 DIAGNOSIS — Z96652 Presence of left artificial knee joint: Secondary | ICD-10-CM | POA: Diagnosis not present

## 2018-07-18 DIAGNOSIS — M25662 Stiffness of left knee, not elsewhere classified: Secondary | ICD-10-CM | POA: Diagnosis not present

## 2018-07-20 DIAGNOSIS — M25662 Stiffness of left knee, not elsewhere classified: Secondary | ICD-10-CM | POA: Diagnosis not present

## 2018-07-20 DIAGNOSIS — M25562 Pain in left knee: Secondary | ICD-10-CM | POA: Diagnosis not present

## 2018-07-20 DIAGNOSIS — Z96652 Presence of left artificial knee joint: Secondary | ICD-10-CM | POA: Diagnosis not present

## 2018-07-26 DIAGNOSIS — M25662 Stiffness of left knee, not elsewhere classified: Secondary | ICD-10-CM | POA: Diagnosis not present

## 2018-07-26 DIAGNOSIS — Z96652 Presence of left artificial knee joint: Secondary | ICD-10-CM | POA: Diagnosis not present

## 2018-07-26 DIAGNOSIS — M25562 Pain in left knee: Secondary | ICD-10-CM | POA: Diagnosis not present

## 2018-07-28 DIAGNOSIS — Z96652 Presence of left artificial knee joint: Secondary | ICD-10-CM | POA: Diagnosis not present

## 2018-07-28 DIAGNOSIS — M25562 Pain in left knee: Secondary | ICD-10-CM | POA: Diagnosis not present

## 2018-07-28 DIAGNOSIS — Z471 Aftercare following joint replacement surgery: Secondary | ICD-10-CM | POA: Diagnosis not present

## 2018-07-28 DIAGNOSIS — M25662 Stiffness of left knee, not elsewhere classified: Secondary | ICD-10-CM | POA: Diagnosis not present

## 2018-08-02 DIAGNOSIS — M25662 Stiffness of left knee, not elsewhere classified: Secondary | ICD-10-CM | POA: Diagnosis not present

## 2018-08-02 DIAGNOSIS — Z96652 Presence of left artificial knee joint: Secondary | ICD-10-CM | POA: Diagnosis not present

## 2018-08-02 DIAGNOSIS — M25562 Pain in left knee: Secondary | ICD-10-CM | POA: Diagnosis not present

## 2018-08-04 DIAGNOSIS — M25662 Stiffness of left knee, not elsewhere classified: Secondary | ICD-10-CM | POA: Diagnosis not present

## 2018-08-04 DIAGNOSIS — Z96652 Presence of left artificial knee joint: Secondary | ICD-10-CM | POA: Diagnosis not present

## 2018-08-04 DIAGNOSIS — M25562 Pain in left knee: Secondary | ICD-10-CM | POA: Diagnosis not present

## 2018-08-09 DIAGNOSIS — M25562 Pain in left knee: Secondary | ICD-10-CM | POA: Diagnosis not present

## 2018-08-09 DIAGNOSIS — Z96652 Presence of left artificial knee joint: Secondary | ICD-10-CM | POA: Diagnosis not present

## 2018-08-09 DIAGNOSIS — M25662 Stiffness of left knee, not elsewhere classified: Secondary | ICD-10-CM | POA: Diagnosis not present

## 2018-08-11 DIAGNOSIS — M25562 Pain in left knee: Secondary | ICD-10-CM | POA: Diagnosis not present

## 2018-08-11 DIAGNOSIS — M25662 Stiffness of left knee, not elsewhere classified: Secondary | ICD-10-CM | POA: Diagnosis not present

## 2018-08-11 DIAGNOSIS — Z96652 Presence of left artificial knee joint: Secondary | ICD-10-CM | POA: Diagnosis not present

## 2018-08-25 DIAGNOSIS — M25561 Pain in right knee: Secondary | ICD-10-CM | POA: Diagnosis not present

## 2018-09-06 DIAGNOSIS — Z1231 Encounter for screening mammogram for malignant neoplasm of breast: Secondary | ICD-10-CM | POA: Diagnosis not present

## 2018-09-06 LAB — HM MAMMOGRAPHY

## 2018-09-16 ENCOUNTER — Ambulatory Visit: Payer: Medicare Other | Admitting: Family Medicine

## 2018-09-20 DIAGNOSIS — Z23 Encounter for immunization: Secondary | ICD-10-CM | POA: Diagnosis not present

## 2018-09-21 ENCOUNTER — Ambulatory Visit: Payer: Medicare Other | Admitting: Family Medicine

## 2018-09-22 ENCOUNTER — Other Ambulatory Visit: Payer: Self-pay

## 2018-09-22 ENCOUNTER — Other Ambulatory Visit: Payer: Self-pay | Admitting: Family Medicine

## 2018-09-22 ENCOUNTER — Ambulatory Visit (INDEPENDENT_AMBULATORY_CARE_PROVIDER_SITE_OTHER): Payer: Medicare Other | Admitting: Family Medicine

## 2018-09-22 ENCOUNTER — Encounter: Payer: Self-pay | Admitting: Family Medicine

## 2018-09-22 VITALS — BP 160/64 | HR 93 | Temp 97.2°F | Ht 62.5 in | Wt 198.2 lb

## 2018-09-22 DIAGNOSIS — F411 Generalized anxiety disorder: Secondary | ICD-10-CM

## 2018-09-22 DIAGNOSIS — I10 Essential (primary) hypertension: Secondary | ICD-10-CM

## 2018-09-22 LAB — LIPID PANEL
Chol/HDL Ratio: 2.2 ratio (ref 0.0–4.4)
Cholesterol, Total: 176 mg/dL (ref 100–199)
HDL: 81 mg/dL (ref >39)
LDL Calculated: 79 mg/dL (ref 0–99)
Triglycerides: 82 mg/dL (ref 0–149)
VLDL Cholesterol Cal: 16 mg/dL (ref 5–40)

## 2018-09-22 LAB — CMP14+EGFR
ALT: 11 IU/L (ref 0–32)
AST: 20 IU/L (ref 0–40)
Albumin/Globulin Ratio: 1.8 (ref 1.2–2.2)
Albumin: 4.5 g/dL (ref 3.6–4.8)
Alkaline Phosphatase: 59 IU/L (ref 39–117)
BUN/Creatinine Ratio: 10 — ABNORMAL LOW (ref 12–28)
BUN: 11 mg/dL (ref 8–27)
Bilirubin Total: 0.5 mg/dL (ref 0.0–1.2)
CO2: 23 mmol/L (ref 20–29)
Calcium: 9.6 mg/dL (ref 8.7–10.3)
Chloride: 98 mmol/L (ref 96–106)
Creatinine, Ser: 1.11 mg/dL — ABNORMAL HIGH (ref 0.57–1.00)
GFR calc Af Amer: 59 mL/min/{1.73_m2} — ABNORMAL LOW (ref >59)
GFR calc non Af Amer: 52 mL/min/{1.73_m2} — ABNORMAL LOW (ref >59)
Globulin, Total: 2.5 g/dL (ref 1.5–4.5)
Glucose: 100 mg/dL — ABNORMAL HIGH (ref 65–99)
Potassium: 4.7 mmol/L (ref 3.5–5.2)
Sodium: 138 mmol/L (ref 134–144)
Total Protein: 7 g/dL (ref 6.0–8.5)

## 2018-09-22 MED ORDER — CITALOPRAM HYDROBROMIDE 20 MG PO TABS: 20.0000 mg | ORAL_TABLET | Freq: Every day | ORAL | 3 refills | Status: DC

## 2018-09-22 MED ORDER — ALPRAZOLAM 0.25 MG PO TABS: 0.2500 mg | ORAL_TABLET | Freq: Every evening | ORAL | 0 refills | Status: DC | PRN

## 2018-09-22 MED ORDER — BUPROPION HCL ER (XL) 150 MG PO TB24: 150.0000 mg | ORAL_TABLET | Freq: Every day | ORAL | 0 refills | Status: DC

## 2018-09-22 MED ORDER — ALENDRONATE SODIUM 70 MG PO TABS: 70.0000 mg | ORAL_TABLET | ORAL | 3 refills | Status: AC

## 2018-09-22 NOTE — Progress Notes (Signed)
10/31/201910:35 AM  Nicole Peters 03-Aug-1951, 67 y.o. female [id number]  Chief Complaint  Patient presents with  . Hypertension    monitors bp at home, numbers range in the 641'R for systolic, 83'E diastolic    HPI:   Patient is a 67 y.o. female with past medical history significant for HTN, depression with anxiety, hyperglycemia, osteoporosis, HLP and OA of left knee s/p BTKA who presents today for routine followup  Last PCP Dr Tamala Julian Last visit April 2019  Had left TKA in august 2019 - recovered well Completed PT  Check BP at home, 130/70s BP cuff has been checked against ours and it reads ok  wellbutrin originally prescribed for quiting smoking She has been able to to quit, 21 months Has not really helped with mood Takes alprazolam BID Has never been on anything for anxiety Denies any issues with depression  Has been on fosamax for about a year Takes calcium and vitamin d Vitamin d 2000 units Last dexa 08/2016  Got her flu vaccine at her pharmacy  Lab Results  Component Value Date   HGBA1C 5.5 03/14/2018   HGBA1C 5.5 02/11/2015   HGBA1C 5.7 (H) 02/05/2014   Lab Results  Component Value Date   LDLCALC 102 (H) 03/14/2018   CREATININE 0.89 06/25/2018    Fall Risk  09/22/2018 09/08/2017 03/09/2017 10/22/2016 03/03/2016  Falls in the past year? _0   Number falls in past yr: - - - - -  Injury with Fall? - - - - -  Comment - - - - -     Depression screen [facility] 2/9 09/22/2018 09/08/2017 03/09/2017  Decreased Interest 0 0 0  Down, Depressed, Hopeless 0 0 0  PHQ - 2 Score 0 0 0    No Known Allergies  Prior to Admission medications   Medication Sig Start Date End Date Taking? Authorizing Provider  ALPRAZolam Duanne Moron) 0.25 MG tablet Take 0.25 mg by mouth at bedtime as needed for anxiety.   Yes [provider]  aspirin 81 MG chewable tablet Chew by mouth daily.   Yes [provider]  benazepril (LOTENSIN) 20 MG tablet Take 1 tablet  (20 mg total) by mouth daily. 03/14/18  Yes Wardell Honour, MD  buPROPion (WELLBUTRIN XL) 300 MG 24 hr tablet Take 1 tablet (300 mg total) by mouth daily. 03/14/18  Yes Wardell Honour, MD  cetirizine (ZYRTEC) 10 MG tablet Take 10 mg by mouth daily as needed for allergies.    Yes [provider]  Cholecalciferol (VITAMIN D3) 2000 units capsule Take 2,000 Units by mouth daily.   Yes [provider]  Multiple Vitamin (MULTIVITAMIN) tablet Take 1 tablet by mouth daily.   Yes [provider]  pravastatin (PRAVACHOL) 80 MG tablet Take 1 tablet (80 mg total) by mouth at bedtime. 03/14/18  Yes Wardell Honour, MD  tiZANidine (ZANAFLEX) 2 MG tablet Take 1 tablet (2 mg total) by mouth every 8 (eight) hours as needed for muscle spasms. 06/24/18  Yes Gary Fleet, PA-C    Past Medical History:  Diagnosis Date  . Allergy   . Anxiety   . Arthritis   . Depression   . History of bronchitis   . Hyperlipidemia   . Hypertension   . Obesity   . Osteoporosis   . Stress incontinence     Past Surgical History:  Procedure Laterality Date  . APPENDECTOMY    . Arthroscopic knee surgery     Both  knees- Dr. Berenice Primas  . CHOLECYSTECTOMY    . COLONOSCOPY  12/22/2007   Diverticulosis. [facility]. Repeat in 10 years.  . INJECTION KNEE Left 03/16/2016   Procedure: LEFT KNEE CORTISONE INJECTION. ;  Surgeon: Dorna Leitz, MD;  Location: [place];  Service: Orthopedics;  Laterality: Left;  . KNEE SURGERY Bilateral    arthroscopic  . LAPAROTOMY     lysis of scar adhesion  . TONSILLECTOMY    . TOTAL KNEE ARTHROPLASTY Right 03/16/2016   Procedure: RIGHT TOTAL KNEE ARTHROPLASTY ( LATERAL APPROACH) & LEFT KNEE CORTISONE INJECTION. ;  Surgeon: Dorna Leitz, MD;  Location: [place];  Service: Orthopedics;  Laterality: Right;  . TOTAL KNEE ARTHROPLASTY Left 06/24/2018   Procedure: LEFT TOTAL KNEE ARTHROPLASTY;  Surgeon: Dorna Leitz, MD;  Location[facility];  Service: Orthopedics;  Laterality: Left;    . TUBAL LIGATION      Social History   Tobacco Use  . Smoking status: Former Smoker    Packs/day: 0.25    Years: 40.00    Pack years: 10.00    Types: Cigarettes    Last attempt to quit: 12/12/2016    Years since quitting: 1.7  . Smokeless tobacco: Never Used  . Tobacco comment: quit smoking 2018  Substance Use Topics  . Alcohol use: No    Alcohol/week: 0.0 standard drinks    Family History  Problem Relation Age of Onset  . Cancer Father        stomach cancer with liver mets  . Heart disease Father        angina; no AMI/CAD;no CABG  . Stomach cancer Father   . Heart disease Brother 50       mild heart attack/AMI in 1997  . Colon cancer Neg Hx   . Colon polyps Neg Hx   . Esophageal cancer Neg Hx   . Rectal cancer Neg Hx     Review of Systems  Constitutional: Negative for chills and fever.  Respiratory: Negative for cough and shortness of breath.   Cardiovascular: Negative for chest pain, palpitations and leg swelling.  Gastrointestinal: Negative for abdominal pain, nausea and vomiting.     OBJECTIVE:  Blood pressure (!) 160/64, pulse 93, temperature (!) 97.2 F (36.2 C), height 5' 2.5" (1.588 m), weight 198 lb 3.2 oz (89.9 kg), SpO2 90 %. Body mass index is 35.67 kg/m.   Wt Readings from Last 3 Encounters:  09/22/18 198 lb 3.2 oz (89.9 kg)  06/24/18 204 lb (92.5 kg)  06/17/18 204 lb 2 oz (92.6 kg)   BP Readings from Last 3 Encounters:  09/22/18 (!) 160/64  06/25/18 (!) 156/81  06/17/18 (!) 188/92    Physical Exam  Constitutional: She is oriented to person, place, and time. She appears well-developed and well-nourished.  HENT:  Head: Normocephalic and atraumatic.  Mouth/Throat: Oropharynx is clear and moist. No oropharyngeal exudate.  Eyes: Pupils are equal, round, and reactive to light. Conjunctivae and EOM are normal. No scleral icterus.  Neck: Neck supple.  Cardiovascular: Normal rate, regular rhythm and normal heart sounds. Exam reveals no gallop  and no friction rub.  No murmur heard. Pulmonary/Chest: Effort normal and breath sounds normal. She has no wheezes. She has no rales.  Musculoskeletal: She exhibits no edema.  Neurological: She is alert and oriented to person, place, and time.  Skin: Skin is warm and dry.  Psychiatric: She has a normal mood and affect.  Nursing note and vitals reviewed.  ASSESSMENT and PLAN  1. Essential  hypertension Controlled per home readings. White coat syndrome. Cont current regime - Lipid panel - CMP14+EGFR  2. Generalized anxiety disorder Uncontrolled. More than 50% of this 25 minutes visit was spent discussing treatment options/standard of care. Will wean off wellbutrin. Start SSRI. Wean off bzd to truly prn use.   Other orders - aspirin 81 MG chewable tablet; Chew by mouth daily. - buPROPion (WELLBUTRIN XL) 150 MG 24 hr tablet; Take 1 tablet (150 mg total) by mouth daily. - citalopram (CELEXA) 20 MG tablet; Take 1 tablet (20 mg total) by mouth daily. - ALPRAZolam (XANAX) 0.25 MG tablet; Take 1-1.5 tablets (0.25-0.375 mg total) by mouth at bedtime as needed for anxiety. - alendronate (FOSAMAX) 70 MG tablet; Take 1 tablet (70 mg total) by mouth every 7 (seven) days. Take with a full glass of water on an empty stomach.   Return in about 6 weeks (around 11/03/2018) for anxiety.    Rutherford Guys, MD Primary Care at [place], [place] [address] Ph.  [phone] Fax [phone]

## 2018-09-22 NOTE — Patient Instructions (Addendum)
Decrease wellbutrin to 150mg  a day x 2 weeks, then every other day x 1 week, then stop Start celexa 1/2 tab daily for one week, then increase to 1 tab daily     If you have lab work done today you will be contacted with your lab results within the next 2 weeks.  If you have not heard from [place] then please contact us. The fastest way to get your results is to register for My Chart.   IF you received an x-ray today, you will receive an invoice from [facility]. Please contact [facility] at [phone] with questions or concerns regarding your invoice.   IF you received labwork today, you will receive an invoice from [facility]. Please contact [facility] at [phone] with questions or concerns regarding your invoice.   Our billing staff will not be able to assist you with questions regarding bills from these companies.  You will be contacted with the lab results as soon as they are available. The fastest way to get your results is to activate your My Chart account. Instructions are located on the last page of this paperwork. If you have not heard from [place] regarding the results in 2 weeks, please contact this office.

## 2018-09-28 ENCOUNTER — Ambulatory Visit: Payer: Self-pay

## 2018-09-28 NOTE — Telephone Encounter (Signed)
Phone call to pt.  Reported she started taking Citalopram 20 mg., 1/2 tab on 11/1, and began having diarrhea stools.  Stated she took a dose on Fri., Sat., Sun., and held a dose on Mon., then resumed Tues PM.  Reported she had diarrhea stools everyday, except the day she held the dose.  Reported 2 diarrhea stools in past 12 hrs.  Denied abdominal pain, nausea, vomiting, fever/ chills, blood in stool, or any other symptoms.  Reported staying well hydrated.  Denied any signs of dehydration. Stated the Citalopram is the only new medication she is taking.  Advised will make Dr. Pamella Pert aware of sx's on Citalopram.  Pt. Agrees with plan.        Reason for Disposition . Caller has NON-URGENT medication question about med that PCP prescribed and triager unable to answer question  Answer Assessment - Initial Assessment Questions 1. DIARRHEA SEVERITY: "How bad is the diarrhea?" "How many extra stools have you had in the past 24 hours than normal?"    - NO DIARRHEA (SCALE 0)   - MILD (SCALE 1-3): Few loose or mushy BMs; increase of 1-3 stools over normal daily number of stools; mild increase in ostomy output.   -  MODERATE (SCALE 4-7): Increase of 4-6 stools daily over normal; moderate increase in ostomy output. * SEVERE (SCALE 8-10; OR 'WORST POSSIBLE'): Increase of 7 or more stools daily over normal; moderate increase in ostomy output; incontinence.     2 diarrhea stools in past 12 hrs.  2. ONSET: "When did the diarrhea begin?"      09/23/18 3. BM CONSISTENCY: "How loose or watery is the diarrhea?"     Loose 4. VOMITING: "Are you also vomiting?" If so, ask: "How many times in the past 24 hours?"      denied 5. ABDOMINAL PAIN: "Are you having any abdominal pain?" If yes: "What does it feel like?" (e.g., crampy, dull, intermittent, constant)      denied 6. ABDOMINAL PAIN SEVERITY: If present, ask: "How bad is the pain?"  (e.g., Scale 1-10; mild, moderate, or severe)   - MILD (1-3): doesn't interfere  with normal activities, abdomen soft and not tender to touch    - MODERATE (4-7): interferes with normal activities or awakens from sleep, tender to touch    - SEVERE (8-10): excruciating pain, doubled over, unable to do any normal activities       n/a 7. ORAL INTAKE: If vomiting, "Have you been able to drink liquids?" "How much fluids have you had in the past 24 hours?"     Is drinking well 8. HYDRATION: "Any signs of dehydration?" (e.g., dry mouth [not just dry lips], too weak to stand, dizziness, new weight loss) "When did you last urinate?"     Denied any signs  9. EXPOSURE: "Have you traveled to a foreign country recently?" "Have you been exposed to anyone with diarrhea?" "Could you have eaten any food that was spoiled?"     no 10. ANTIBIOTIC USE: "Are you taking antibiotics now or have you taken antibiotics in the past 2 months?"       Denied  11. OTHER SYMPTOMS: "Do you have any other symptoms?" (e.g., fever, blood in stool)       Denied any other signs/ sx's  12. PREGNANCY: "Is there any chance you are pregnant?" "When was your last menstrual period?"       n/a  Answer Assessment - Initial Assessment Questions 1. SYMPTOMS: "Do you have any symptoms?"  diarrhea 2. SEVERITY: If symptoms are present, ask "Are they mild, moderate or severe?"     Mild; 1-3 stools/ day  Protocols used: MEDICATION QUESTION CALL-A-AH, DIARRHEA-A-AH  Message from Cecelia Byars, NT sent at 09/28/2018 8:09 AM EST   Patient called and states that since she has been taking citalopram (CELEXA) 20 MG tablet, she has been having diarrhea even when she takes half of a pill please call her at [phone]

## 2018-09-29 MED ORDER — DULOXETINE HCL 30 MG PO CPEP: 30.0000 mg | ORAL_CAPSULE | Freq: Every day | ORAL | 3 refills | Status: DC

## 2018-09-29 NOTE — Telephone Encounter (Signed)
Call from pt.  When she takes Citalopram has 2-3 episodes of very watery diarrhea.  Does not have diarrhea when she does not take medicine.  Denies abd pain, bloody stool, n/v, fever, etc.  Pt feels it is directly related to Citalopram and has stopped medicine.  Would like to see if you could call in a different medication.

## 2018-09-29 NOTE — Telephone Encounter (Signed)
Please let patient know that I sent a prescription for duloxetine 30mg  once a day. Agree with stopping citalopram. thanks

## 2018-09-29 NOTE — Addendum Note (Signed)
Addended by: Rutherford Guys on: 09/29/2018 10:31 PM   Modules accepted: Orders

## 2018-09-30 NOTE — Telephone Encounter (Signed)
Spoke to pt.  She has picked up rx and will stop the Citalopram per Dr. Pamella Pert

## 2018-11-03 ENCOUNTER — Encounter: Payer: Self-pay | Admitting: Family Medicine

## 2018-11-03 ENCOUNTER — Other Ambulatory Visit: Payer: Self-pay

## 2018-11-03 ENCOUNTER — Ambulatory Visit (INDEPENDENT_AMBULATORY_CARE_PROVIDER_SITE_OTHER): Payer: Medicare Other | Admitting: Family Medicine

## 2018-11-03 VITALS — BP 166/96 | HR 89 | Temp 97.4°F | Ht 62.5 in | Wt 200.4 lb

## 2018-11-03 DIAGNOSIS — N289 Disorder of kidney and ureter, unspecified: Secondary | ICD-10-CM | POA: Diagnosis not present

## 2018-11-03 DIAGNOSIS — I10 Essential (primary) hypertension: Secondary | ICD-10-CM

## 2018-11-03 DIAGNOSIS — F411 Generalized anxiety disorder: Secondary | ICD-10-CM

## 2018-11-03 MED ORDER — BUSPIRONE HCL 5 MG PO TABS: 2.5000 mg | ORAL_TABLET | Freq: Two times a day (BID) | ORAL | 1 refills | Status: DC

## 2018-11-03 NOTE — Progress Notes (Signed)
12/12/201912:11 PM  Nicole Peters Feb 07, 1951, 67 y.o. female [id number]  Chief Complaint  Patient presents with  . Anxiety    medication is not doing well. Giving diarrhea and vertigo. Wants to just stay on her xanax  . Follow-up    for kidney function lab    HPI:   Patient is a 67 y.o. female with past medical history significant for HTN, depression with anxiety, hyperglycemia, osteoporosis, HLP and OA of left knee s/p BTKA  who presents today for routine followup  Last OV started on celexa celexa 66m - caused diarrhea and dizzy cymbalta 377m- caused dizzy Tried taking at night and morning, with food or without food Stopped wellbutrin had no issues  Gad 7 = 4 Family members had issue with prozac  Patient with know sign white coat syndrome BP this morning at home 137/73  Lab Results  Component Value Date   CREATININE 1.11 (H) 09/22/2018    Fall Risk  11/03/2018 09/22/2018 09/08/2017 03/09/2017 10/22/2016  Falls in the past year? 0 No No No No  Number falls in past yr: - - - - -  Injury with Fall? - - - - -  Comment - - - - -     [facility] 11/03/2018 09/22/2018 09/08/2017  Decreased Interest 0 0 0  Down, Depressed, Hopeless 0 0 0  PHQ - 2 Score 0 0 0    No Known Allergies  Prior to Admission medications   Medication Sig Start Date End Date Taking? Authorizing Provider  alendronate (FOSAMAX) 70 MG tablet Take 1 tablet (70 mg total) by mouth every 7 (seven) days. Take with a full glass of water on an empty stomach. 09/22/18   SaRutherford GuysMD  ALPRAZolam (XDuanne Moron0.25 MG tablet Take 1-1.5 tablets (0.25-0.375 mg total) by mouth at bedtime as needed for anxiety. 09/22/18   SaRutherford GuysMD  aspirin 81 MG chewable tablet Chew by mouth daily.    [provider]  benazepril (LOTENSIN) 20 MG tablet Take 1 tablet (20 mg total) by mouth daily. 03/14/18   SmWardell HonourMD  buPROPion (WELLBUTRIN XL) 150 MG 24 hr tablet Take 1 tablet  (150 mg total) by mouth daily. 09/22/18   SaRutherford GuysMD  cetirizine (ZYRTEC) 10 MG tablet Take 10 mg by mouth daily as needed for allergies.     [provider]  Cholecalciferol (VITAMIN D3) 2000 units capsule Take 2,000 Units by mouth daily.    [provider]  Multiple Vitamin (MULTIVITAMIN) tablet Take 1 tablet by mouth daily.    [provider]  pravastatin (PRAVACHOL) 80 MG tablet Take 1 tablet (80 mg total) by mouth at bedtime. 03/14/18   SmWardell HonourMD  tiZANidine (ZANAFLEX) 2 MG tablet Take 1 tablet (2 mg total) by mouth every 8 (eight) hours as needed for muscle spasms. 06/24/18   BeGary FleetPA-C    Past Medical History:  Diagnosis Date  . Allergy   . Anxiety   . Arthritis   . Depression   . History of bronchitis   . Hyperlipidemia   . Hypertension   . Obesity   . Osteoporosis   . Stress incontinence     Past Surgical History:  Procedure Laterality Date  . APPENDECTOMY    . Arthroscopic knee surgery     Both knees- Dr. GrBerenice Primas. CHOLECYSTECTOMY    . COLONOSCOPY  12/22/2007   Diverticulosis. [facility]/[place]. Repeat in  10 years.  . INJECTION KNEE Left 03/16/2016   Procedure: LEFT KNEE CORTISONE INJECTION. ;  Surgeon: Dorna Leitz, MD;  Location: [place];  Service: Orthopedics;  Laterality: Left;  . KNEE SURGERY Bilateral    arthroscopic  . LAPAROTOMY     lysis of scar adhesion  . TONSILLECTOMY    . TOTAL KNEE ARTHROPLASTY Right 03/16/2016   Procedure: RIGHT TOTAL KNEE ARTHROPLASTY ( LATERAL APPROACH) & LEFT KNEE CORTISONE INJECTION. ;  Surgeon: Dorna Leitz, MD;  Location: [place];  Service: Orthopedics;  Laterality: Right;  . TOTAL KNEE ARTHROPLASTY Left 06/24/2018   Procedure: LEFT TOTAL KNEE ARTHROPLASTY;  Surgeon: Dorna Leitz, MD;  Location[facility];  Service: Orthopedics;  Laterality: Left;  . TUBAL LIGATION      Social History   Tobacco Use  . Smoking status: Former Smoker    Packs/day: 0.25    Years: 40.00    Pack  years: 10.00    Types: Cigarettes    Last attempt to quit: 12/12/2016    Years since quitting: 1.8  . Smokeless tobacco: Never Used  . Tobacco comment: quit smoking 2018  Substance Use Topics  . Alcohol use: No    Alcohol/week: 0.0 standard drinks    Family History  Problem Relation Age of Onset  . Cancer Father        stomach cancer with liver mets  . Heart disease Father        angina; no AMI/CAD;no CABG  . Stomach cancer Father   . Heart disease Brother 50       mild heart attack/AMI in 1997  . Colon cancer Neg Hx   . Colon polyps Neg Hx   . Esophageal cancer Neg Hx   . Rectal cancer Neg Hx     Review of Systems  Constitutional: Negative for chills and fever.  Respiratory: Negative for cough and shortness of breath.   Cardiovascular: Negative for chest pain, palpitations and leg swelling.  Gastrointestinal: Negative for abdominal pain, nausea and vomiting.  Psychiatric/Behavioral: The patient is nervous/anxious.      OBJECTIVE:  Blood pressure (!) 166/96, pulse 89, temperature (!) 97.4 F (36.3 C), temperature source Oral, height 5' 2.5" (1.588 m), weight 200 lb 6.4 oz (90.9 kg), SpO2 90 %. Body mass index is 36.07 kg/m.    Physical Exam Vitals signs and nursing note reviewed.  Constitutional:      Appearance: She is well-developed.  HENT:     Head: Normocephalic and atraumatic.     Mouth/Throat:     Pharynx: No oropharyngeal exudate.  Eyes:     General: No scleral icterus.    Conjunctiva/sclera: Conjunctivae normal.     Pupils: Pupils are equal, round, and reactive to light.  Neck:     Musculoskeletal: Neck supple.  Cardiovascular:     Rate and Rhythm: Normal rate and regular rhythm.     Heart sounds: Normal heart sounds. No murmur. No friction rub. No gallop.   Pulmonary:     Effort: Pulmonary effort is normal.     Breath sounds: Normal breath sounds. No wheezing or rales.  Skin:    General: Skin is warm and dry.  Neurological:     Mental Status:  She is alert and oriented to person, place, and time.     ASSESSMENT and PLAN  1. Generalized anxiety disorder Uncontrolled. With long hx of chronic bzd use. Intolerant so far of celexa and cymbalta due to dizziness. Trial of very low dose buspar. New  med r/se/b reviewed. Consider adding atenolol/propranolol for bp/anxiety.   2. Essential hypertension Patient with known white coat syndrome. Reports at goal bp at home with confirmed bp cuff. However recent decreased in kidney function, if persistent then wonder is BP truly controlled... - CMP14+EGFR - Care order/instruction:  3. Abnormal kidney function Rechecking today - Basic Metabolic Panel  Other orders - busPIRone (BUSPAR) 5 MG tablet; Take 0.5 tablets (2.5 mg total) by mouth 2 (two) times daily.   Return in about 6 weeks (around 12/15/2018). anxiety    Rutherford Guys, MD Primary Care at [place], [place] [address] Ph.  [phone] Fax [phone]

## 2018-11-03 NOTE — Patient Instructions (Signed)
° ° ° °  If you have lab work done today you will be contacted with your lab results within the next 2 weeks.  If you have not heard from us then please contact us. The fastest way to get your results is to register for My Chart. ° ° °IF you received an x-ray today, you will receive an invoice from [facility]. Please contact [facility] at [phone] with questions or concerns regarding your invoice.  ° °IF you received labwork today, you will receive an invoice from [facility]. Please contact [facility] at [phone] with questions or concerns regarding your invoice.  ° °Our billing staff will not be able to assist you with questions regarding bills from these companies. ° °You will be contacted with the lab results as soon as they are available. The fastest way to get your results is to activate your My Chart account. Instructions are located on the last page of this paperwork. If you have not heard from us regarding the results in 2 weeks, please contact this office. °  ° ° ° °

## 2018-11-04 ENCOUNTER — Encounter: Payer: Self-pay | Admitting: Family Medicine

## 2018-11-04 LAB — CMP14+EGFR
ALT: 10 IU/L (ref 0–32)
AST: 18 IU/L (ref 0–40)
Albumin/Globulin Ratio: 1.8 (ref 1.2–2.2)
Albumin: 4.6 g/dL (ref 3.6–4.8)
Alkaline Phosphatase: 56 IU/L (ref 39–117)
BUN/Creatinine Ratio: 15 (ref 12–28)
BUN: 14 mg/dL (ref 8–27)
Bilirubin Total: 0.7 mg/dL (ref 0.0–1.2)
CO2: 25 mmol/L (ref 20–29)
Calcium: 9.7 mg/dL (ref 8.7–10.3)
Chloride: 97 mmol/L (ref 96–106)
Creatinine, Ser: 0.96 mg/dL (ref 0.57–1.00)
GFR calc Af Amer: 71 mL/min/{1.73_m2} (ref >59)
GFR calc non Af Amer: 61 mL/min/{1.73_m2} (ref >59)
Globulin, Total: 2.5 g/dL (ref 1.5–4.5)
Glucose: 93 mg/dL (ref 65–99)
Potassium: 4.9 mmol/L (ref 3.5–5.2)
Sodium: 140 mmol/L (ref 134–144)
Total Protein: 7.1 g/dL (ref 6.0–8.5)

## 2018-11-04 NOTE — Addendum Note (Signed)
Addended by: Rutherford Guys on: 11/04/2018 09:42 PM   Modules accepted: Orders

## 2018-11-30 DIAGNOSIS — I10 Essential (primary) hypertension: Secondary | ICD-10-CM | POA: Diagnosis not present

## 2018-11-30 DIAGNOSIS — F411 Generalized anxiety disorder: Secondary | ICD-10-CM | POA: Diagnosis not present

## 2018-11-30 DIAGNOSIS — M810 Age-related osteoporosis without current pathological fracture: Secondary | ICD-10-CM | POA: Diagnosis not present

## 2018-11-30 DIAGNOSIS — E785 Hyperlipidemia, unspecified: Secondary | ICD-10-CM | POA: Diagnosis not present

## 2018-11-30 DIAGNOSIS — R0600 Dyspnea, unspecified: Secondary | ICD-10-CM | POA: Diagnosis not present

## 2018-11-30 DIAGNOSIS — J449 Chronic obstructive pulmonary disease, unspecified: Secondary | ICD-10-CM | POA: Diagnosis not present

## 2018-12-05 DIAGNOSIS — M1712 Unilateral primary osteoarthritis, left knee: Secondary | ICD-10-CM | POA: Diagnosis not present

## 2018-12-19 ENCOUNTER — Ambulatory Visit: Payer: Medicare Other | Admitting: Family Medicine

## 2018-12-30 DIAGNOSIS — J449 Chronic obstructive pulmonary disease, unspecified: Secondary | ICD-10-CM | POA: Diagnosis not present

## 2018-12-30 DIAGNOSIS — I10 Essential (primary) hypertension: Secondary | ICD-10-CM | POA: Diagnosis not present

## 2018-12-30 DIAGNOSIS — E785 Hyperlipidemia, unspecified: Secondary | ICD-10-CM | POA: Diagnosis not present

## 2018-12-30 DIAGNOSIS — Z0000 Encounter for general adult medical examination without abnormal findings: Secondary | ICD-10-CM | POA: Diagnosis not present

## 2019-03-20 DIAGNOSIS — F411 Generalized anxiety disorder: Secondary | ICD-10-CM | POA: Diagnosis not present

## 2019-03-20 DIAGNOSIS — J45909 Unspecified asthma, uncomplicated: Secondary | ICD-10-CM | POA: Diagnosis not present

## 2019-03-20 DIAGNOSIS — M810 Age-related osteoporosis without current pathological fracture: Secondary | ICD-10-CM | POA: Diagnosis not present

## 2019-03-20 DIAGNOSIS — I10 Essential (primary) hypertension: Secondary | ICD-10-CM | POA: Diagnosis not present

## 2019-03-20 DIAGNOSIS — R7301 Impaired fasting glucose: Secondary | ICD-10-CM | POA: Diagnosis not present

## 2019-03-20 DIAGNOSIS — E785 Hyperlipidemia, unspecified: Secondary | ICD-10-CM | POA: Diagnosis not present

## 2019-03-21 DIAGNOSIS — J45909 Unspecified asthma, uncomplicated: Secondary | ICD-10-CM | POA: Diagnosis not present

## 2019-03-21 DIAGNOSIS — F411 Generalized anxiety disorder: Secondary | ICD-10-CM | POA: Diagnosis not present

## 2019-03-21 DIAGNOSIS — I10 Essential (primary) hypertension: Secondary | ICD-10-CM | POA: Diagnosis not present

## 2019-03-22 DIAGNOSIS — D235 Other benign neoplasm of skin of trunk: Secondary | ICD-10-CM | POA: Diagnosis not present

## 2019-03-22 DIAGNOSIS — D485 Neoplasm of uncertain behavior of skin: Secondary | ICD-10-CM | POA: Diagnosis not present

## 2019-04-06 DIAGNOSIS — D235 Other benign neoplasm of skin of trunk: Secondary | ICD-10-CM | POA: Diagnosis not present

## 2019-04-06 DIAGNOSIS — L905 Scar conditions and fibrosis of skin: Secondary | ICD-10-CM | POA: Diagnosis not present

## 2019-04-20 DIAGNOSIS — M19012 Primary osteoarthritis, left shoulder: Secondary | ICD-10-CM | POA: Diagnosis not present

## 2019-04-20 DIAGNOSIS — M24812 Other specific joint derangements of left shoulder, not elsewhere classified: Secondary | ICD-10-CM | POA: Diagnosis not present

## 2019-06-13 DIAGNOSIS — E039 Hypothyroidism, unspecified: Secondary | ICD-10-CM | POA: Diagnosis not present

## 2019-06-13 DIAGNOSIS — M19012 Primary osteoarthritis, left shoulder: Secondary | ICD-10-CM | POA: Diagnosis not present

## 2019-06-13 DIAGNOSIS — Z96651 Presence of right artificial knee joint: Secondary | ICD-10-CM | POA: Diagnosis not present

## 2019-06-13 DIAGNOSIS — U071 COVID-19: Secondary | ICD-10-CM | POA: Diagnosis not present

## 2019-06-13 DIAGNOSIS — Z96652 Presence of left artificial knee joint: Secondary | ICD-10-CM | POA: Diagnosis not present

## 2019-06-13 DIAGNOSIS — I10 Essential (primary) hypertension: Secondary | ICD-10-CM | POA: Diagnosis not present

## 2019-06-13 DIAGNOSIS — J45909 Unspecified asthma, uncomplicated: Secondary | ICD-10-CM | POA: Diagnosis not present

## 2019-06-13 DIAGNOSIS — F411 Generalized anxiety disorder: Secondary | ICD-10-CM | POA: Diagnosis not present

## 2019-07-25 DIAGNOSIS — M67912 Unspecified disorder of synovium and tendon, left shoulder: Secondary | ICD-10-CM | POA: Diagnosis not present

## 2019-09-13 DIAGNOSIS — J45909 Unspecified asthma, uncomplicated: Secondary | ICD-10-CM | POA: Diagnosis not present

## 2019-09-13 DIAGNOSIS — J449 Chronic obstructive pulmonary disease, unspecified: Secondary | ICD-10-CM | POA: Diagnosis not present

## 2019-09-13 DIAGNOSIS — I10 Essential (primary) hypertension: Secondary | ICD-10-CM | POA: Diagnosis not present

## 2019-09-13 DIAGNOSIS — E039 Hypothyroidism, unspecified: Secondary | ICD-10-CM | POA: Diagnosis not present

## 2019-09-19 DIAGNOSIS — E039 Hypothyroidism, unspecified: Secondary | ICD-10-CM | POA: Diagnosis not present

## 2019-09-19 DIAGNOSIS — E785 Hyperlipidemia, unspecified: Secondary | ICD-10-CM | POA: Diagnosis not present

## 2019-09-19 DIAGNOSIS — M810 Age-related osteoporosis without current pathological fracture: Secondary | ICD-10-CM | POA: Diagnosis not present

## 2019-09-19 DIAGNOSIS — J449 Chronic obstructive pulmonary disease, unspecified: Secondary | ICD-10-CM | POA: Diagnosis not present

## 2019-09-19 DIAGNOSIS — I10 Essential (primary) hypertension: Secondary | ICD-10-CM | POA: Diagnosis not present

## 2019-09-19 DIAGNOSIS — Z23 Encounter for immunization: Secondary | ICD-10-CM | POA: Diagnosis not present

## 2019-09-19 DIAGNOSIS — F411 Generalized anxiety disorder: Secondary | ICD-10-CM | POA: Diagnosis not present

## 2019-09-19 DIAGNOSIS — J45909 Unspecified asthma, uncomplicated: Secondary | ICD-10-CM | POA: Diagnosis not present

## 2019-09-19 DIAGNOSIS — R7309 Other abnormal glucose: Secondary | ICD-10-CM | POA: Diagnosis not present

## 2019-10-02 DIAGNOSIS — Z96653 Presence of artificial knee joint, bilateral: Secondary | ICD-10-CM | POA: Diagnosis not present

## 2019-10-02 DIAGNOSIS — M810 Age-related osteoporosis without current pathological fracture: Secondary | ICD-10-CM | POA: Diagnosis not present

## 2019-10-02 DIAGNOSIS — R635 Abnormal weight gain: Secondary | ICD-10-CM | POA: Diagnosis not present

## 2019-10-02 DIAGNOSIS — Z1231 Encounter for screening mammogram for malignant neoplasm of breast: Secondary | ICD-10-CM | POA: Diagnosis not present

## 2019-10-17 DIAGNOSIS — J449 Chronic obstructive pulmonary disease, unspecified: Secondary | ICD-10-CM | POA: Diagnosis not present

## 2019-10-17 DIAGNOSIS — I10 Essential (primary) hypertension: Secondary | ICD-10-CM | POA: Diagnosis not present

## 2019-10-17 DIAGNOSIS — J45909 Unspecified asthma, uncomplicated: Secondary | ICD-10-CM | POA: Diagnosis not present

## 2019-10-17 DIAGNOSIS — E039 Hypothyroidism, unspecified: Secondary | ICD-10-CM | POA: Diagnosis not present

## 2020-01-09 DIAGNOSIS — J449 Chronic obstructive pulmonary disease, unspecified: Secondary | ICD-10-CM | POA: Diagnosis not present

## 2020-01-09 DIAGNOSIS — J45909 Unspecified asthma, uncomplicated: Secondary | ICD-10-CM | POA: Diagnosis not present

## 2020-01-09 DIAGNOSIS — E039 Hypothyroidism, unspecified: Secondary | ICD-10-CM | POA: Diagnosis not present

## 2020-01-09 DIAGNOSIS — I10 Essential (primary) hypertension: Secondary | ICD-10-CM | POA: Diagnosis not present

## 2020-01-31 ENCOUNTER — Telehealth: Payer: Self-pay | Admitting: *Deleted

## 2020-01-31 NOTE — Telephone Encounter (Signed)
Schedule an AWV  Is patient still seeing Dr. Pamella Pert?

## 2020-03-19 DIAGNOSIS — E785 Hyperlipidemia, unspecified: Secondary | ICD-10-CM | POA: Diagnosis not present

## 2020-03-19 DIAGNOSIS — I10 Essential (primary) hypertension: Secondary | ICD-10-CM | POA: Diagnosis not present

## 2020-03-19 DIAGNOSIS — E039 Hypothyroidism, unspecified: Secondary | ICD-10-CM | POA: Diagnosis not present

## 2020-03-19 DIAGNOSIS — R7301 Impaired fasting glucose: Secondary | ICD-10-CM | POA: Diagnosis not present

## 2020-03-19 DIAGNOSIS — J449 Chronic obstructive pulmonary disease, unspecified: Secondary | ICD-10-CM | POA: Diagnosis not present

## 2020-03-19 DIAGNOSIS — E559 Vitamin D deficiency, unspecified: Secondary | ICD-10-CM | POA: Diagnosis not present

## 2020-03-19 DIAGNOSIS — R0609 Other forms of dyspnea: Secondary | ICD-10-CM | POA: Diagnosis not present

## 2020-04-02 DIAGNOSIS — M25552 Pain in left hip: Secondary | ICD-10-CM | POA: Diagnosis not present

## 2020-04-02 DIAGNOSIS — M5442 Lumbago with sciatica, left side: Secondary | ICD-10-CM | POA: Diagnosis not present

## 2020-04-02 DIAGNOSIS — M545 Low back pain: Secondary | ICD-10-CM | POA: Diagnosis not present

## 2020-06-11 DIAGNOSIS — M25532 Pain in left wrist: Secondary | ICD-10-CM | POA: Diagnosis not present

## 2020-06-11 DIAGNOSIS — M19032 Primary osteoarthritis, left wrist: Secondary | ICD-10-CM | POA: Diagnosis not present

## 2020-06-20 ENCOUNTER — Telehealth: Payer: Self-pay | Admitting: *Deleted

## 2020-06-20 NOTE — Telephone Encounter (Signed)
Schedule AWV.  

## 2020-06-24 DIAGNOSIS — J449 Chronic obstructive pulmonary disease, unspecified: Secondary | ICD-10-CM | POA: Diagnosis not present

## 2020-06-24 DIAGNOSIS — E039 Hypothyroidism, unspecified: Secondary | ICD-10-CM | POA: Diagnosis not present

## 2020-06-24 DIAGNOSIS — I10 Essential (primary) hypertension: Secondary | ICD-10-CM | POA: Diagnosis not present

## 2020-06-24 DIAGNOSIS — E785 Hyperlipidemia, unspecified: Secondary | ICD-10-CM | POA: Diagnosis not present

## 2020-09-11 DIAGNOSIS — Z23 Encounter for immunization: Secondary | ICD-10-CM | POA: Diagnosis not present

## 2020-09-20 DIAGNOSIS — E785 Hyperlipidemia, unspecified: Secondary | ICD-10-CM | POA: Diagnosis not present

## 2020-09-20 DIAGNOSIS — E039 Hypothyroidism, unspecified: Secondary | ICD-10-CM | POA: Diagnosis not present

## 2020-09-20 DIAGNOSIS — I10 Essential (primary) hypertension: Secondary | ICD-10-CM | POA: Diagnosis not present

## 2020-09-20 DIAGNOSIS — J449 Chronic obstructive pulmonary disease, unspecified: Secondary | ICD-10-CM | POA: Diagnosis not present

## 2020-11-04 DIAGNOSIS — E871 Hypo-osmolality and hyponatremia: Secondary | ICD-10-CM | POA: Diagnosis not present

## 2020-11-04 DIAGNOSIS — J449 Chronic obstructive pulmonary disease, unspecified: Secondary | ICD-10-CM | POA: Diagnosis not present

## 2020-11-04 DIAGNOSIS — E785 Hyperlipidemia, unspecified: Secondary | ICD-10-CM | POA: Diagnosis not present

## 2020-11-04 DIAGNOSIS — I10 Essential (primary) hypertension: Secondary | ICD-10-CM | POA: Diagnosis not present

## 2020-12-31 DIAGNOSIS — M25532 Pain in left wrist: Secondary | ICD-10-CM | POA: Diagnosis not present

## 2020-12-31 DIAGNOSIS — M19032 Primary osteoarthritis, left wrist: Secondary | ICD-10-CM | POA: Diagnosis not present

## 2021-03-24 DIAGNOSIS — I10 Essential (primary) hypertension: Secondary | ICD-10-CM | POA: Diagnosis not present

## 2021-03-24 DIAGNOSIS — F411 Generalized anxiety disorder: Secondary | ICD-10-CM | POA: Diagnosis not present

## 2021-03-24 DIAGNOSIS — R7301 Impaired fasting glucose: Secondary | ICD-10-CM | POA: Diagnosis not present

## 2021-03-24 DIAGNOSIS — Z0001 Encounter for general adult medical examination with abnormal findings: Secondary | ICD-10-CM | POA: Diagnosis not present

## 2021-03-24 DIAGNOSIS — R0609 Other forms of dyspnea: Secondary | ICD-10-CM | POA: Diagnosis not present

## 2021-03-24 DIAGNOSIS — M810 Age-related osteoporosis without current pathological fracture: Secondary | ICD-10-CM | POA: Diagnosis not present

## 2021-03-24 DIAGNOSIS — E785 Hyperlipidemia, unspecified: Secondary | ICD-10-CM | POA: Diagnosis not present

## 2021-03-24 DIAGNOSIS — E039 Hypothyroidism, unspecified: Secondary | ICD-10-CM | POA: Diagnosis not present

## 2021-03-27 DIAGNOSIS — M19132 Post-traumatic osteoarthritis, left wrist: Secondary | ICD-10-CM | POA: Diagnosis not present

## 2021-06-06 DIAGNOSIS — Z0001 Encounter for general adult medical examination with abnormal findings: Secondary | ICD-10-CM | POA: Diagnosis not present

## 2021-06-06 DIAGNOSIS — M810 Age-related osteoporosis without current pathological fracture: Secondary | ICD-10-CM | POA: Diagnosis not present

## 2021-06-06 DIAGNOSIS — R0609 Other forms of dyspnea: Secondary | ICD-10-CM | POA: Diagnosis not present

## 2021-06-06 DIAGNOSIS — E871 Hypo-osmolality and hyponatremia: Secondary | ICD-10-CM | POA: Diagnosis not present

## 2021-06-06 DIAGNOSIS — E785 Hyperlipidemia, unspecified: Secondary | ICD-10-CM | POA: Diagnosis not present

## 2021-06-06 DIAGNOSIS — E039 Hypothyroidism, unspecified: Secondary | ICD-10-CM | POA: Diagnosis not present

## 2021-06-06 DIAGNOSIS — I10 Essential (primary) hypertension: Secondary | ICD-10-CM | POA: Diagnosis not present

## 2021-07-31 DIAGNOSIS — Z23 Encounter for immunization: Secondary | ICD-10-CM | POA: Diagnosis not present

## 2021-08-07 DIAGNOSIS — E785 Hyperlipidemia, unspecified: Secondary | ICD-10-CM | POA: Diagnosis not present

## 2021-08-07 DIAGNOSIS — I10 Essential (primary) hypertension: Secondary | ICD-10-CM | POA: Diagnosis not present

## 2021-09-25 DIAGNOSIS — E785 Hyperlipidemia, unspecified: Secondary | ICD-10-CM | POA: Diagnosis not present

## 2021-09-25 DIAGNOSIS — E039 Hypothyroidism, unspecified: Secondary | ICD-10-CM | POA: Diagnosis not present

## 2021-09-25 DIAGNOSIS — J449 Chronic obstructive pulmonary disease, unspecified: Secondary | ICD-10-CM | POA: Diagnosis not present

## 2021-09-25 DIAGNOSIS — I10 Essential (primary) hypertension: Secondary | ICD-10-CM | POA: Diagnosis not present

## 2021-09-25 DIAGNOSIS — M810 Age-related osteoporosis without current pathological fracture: Secondary | ICD-10-CM | POA: Diagnosis not present

## 2021-11-22 DIAGNOSIS — M791 Myalgia, unspecified site: Secondary | ICD-10-CM | POA: Diagnosis not present

## 2021-11-22 DIAGNOSIS — R051 Acute cough: Secondary | ICD-10-CM | POA: Diagnosis not present

## 2021-11-22 DIAGNOSIS — R5383 Other fatigue: Secondary | ICD-10-CM | POA: Diagnosis not present

## 2021-11-22 DIAGNOSIS — B349 Viral infection, unspecified: Secondary | ICD-10-CM | POA: Diagnosis not present

## 2022-07-17 ENCOUNTER — Other Ambulatory Visit (HOSPITAL_COMMUNITY): Payer: Self-pay | Admitting: Family Medicine

## 2022-07-17 DIAGNOSIS — I272 Pulmonary hypertension, unspecified: Secondary | ICD-10-CM

## 2022-07-17 DIAGNOSIS — J449 Chronic obstructive pulmonary disease, unspecified: Secondary | ICD-10-CM

## 2022-07-30 ENCOUNTER — Ambulatory Visit (HOSPITAL_COMMUNITY): Payer: Medicare Other | Attending: Family Medicine

## 2022-07-30 DIAGNOSIS — I272 Pulmonary hypertension, unspecified: Secondary | ICD-10-CM | POA: Insufficient documentation

## 2022-07-30 DIAGNOSIS — J449 Chronic obstructive pulmonary disease, unspecified: Secondary | ICD-10-CM | POA: Diagnosis not present

## 2022-07-30 LAB — ECHOCARDIOGRAM COMPLETE
Area-P 1/2: 3.53 cm2
S' Lateral: 2.5 cm

## 2022-07-30 MED ORDER — PERFLUTREN LIPID MICROSPHERE
1.0000 mL | INTRAVENOUS | Status: AC | PRN
  Administered 2022-07-30: 2 mL via INTRAVENOUS

## 2023-04-21 ENCOUNTER — Other Ambulatory Visit: Payer: Self-pay

## 2023-04-21 ENCOUNTER — Emergency Department (HOSPITAL_COMMUNITY): Payer: Medicare Other

## 2023-04-21 ENCOUNTER — Encounter (HOSPITAL_COMMUNITY): Payer: Self-pay

## 2023-04-21 ENCOUNTER — Inpatient Hospital Stay (HOSPITAL_COMMUNITY)
Admission: EM | Admit: 2023-04-21 | Discharge: 2023-05-05 | DRG: 981 | Disposition: A | Discharge status: Skilled Nursing Facility | Payer: Medicare Other | Attending: Internal Medicine | Admitting: Internal Medicine

## 2023-04-21 DIAGNOSIS — Z96653 Presence of artificial knee joint, bilateral: Secondary | ICD-10-CM | POA: Diagnosis present

## 2023-04-21 DIAGNOSIS — D72829 Elevated white blood cell count, unspecified: Secondary | ICD-10-CM | POA: Diagnosis not present

## 2023-04-21 DIAGNOSIS — R578 Other shock: Secondary | ICD-10-CM

## 2023-04-21 DIAGNOSIS — D62 Acute posthemorrhagic anemia: Secondary | ICD-10-CM | POA: Diagnosis present

## 2023-04-21 DIAGNOSIS — D649 Anemia, unspecified: Secondary | ICD-10-CM

## 2023-04-21 DIAGNOSIS — Z809 Family history of malignant neoplasm, unspecified: Secondary | ICD-10-CM

## 2023-04-21 DIAGNOSIS — G9341 Metabolic encephalopathy: Secondary | ICD-10-CM | POA: Diagnosis present

## 2023-04-21 DIAGNOSIS — K264 Chronic or unspecified duodenal ulcer with hemorrhage: Secondary | ICD-10-CM | POA: Diagnosis present

## 2023-04-21 DIAGNOSIS — D688 Other specified coagulation defects: Secondary | ICD-10-CM | POA: Diagnosis present

## 2023-04-21 DIAGNOSIS — Z800 Family history of malignant neoplasm of digestive organs: Secondary | ICD-10-CM

## 2023-04-21 DIAGNOSIS — K7200 Acute and subacute hepatic failure without coma: Secondary | ICD-10-CM | POA: Diagnosis present

## 2023-04-21 DIAGNOSIS — E861 Hypovolemia: Secondary | ICD-10-CM

## 2023-04-21 DIAGNOSIS — I10 Essential (primary) hypertension: Secondary | ICD-10-CM | POA: Diagnosis present

## 2023-04-21 DIAGNOSIS — E785 Hyperlipidemia, unspecified: Secondary | ICD-10-CM | POA: Diagnosis present

## 2023-04-21 DIAGNOSIS — J45909 Unspecified asthma, uncomplicated: Secondary | ICD-10-CM | POA: Diagnosis present

## 2023-04-21 DIAGNOSIS — K449 Diaphragmatic hernia without obstruction or gangrene: Secondary | ICD-10-CM | POA: Diagnosis present

## 2023-04-21 DIAGNOSIS — N179 Acute kidney failure, unspecified: Secondary | ICD-10-CM

## 2023-04-21 DIAGNOSIS — Z1152 Encounter for screening for COVID-19: Secondary | ICD-10-CM

## 2023-04-21 DIAGNOSIS — Z7983 Long term (current) use of bisphosphonates: Secondary | ICD-10-CM

## 2023-04-21 DIAGNOSIS — K922 Gastrointestinal hemorrhage, unspecified: Secondary | ICD-10-CM | POA: Diagnosis not present

## 2023-04-21 DIAGNOSIS — K222 Esophageal obstruction: Secondary | ICD-10-CM | POA: Diagnosis present

## 2023-04-21 DIAGNOSIS — M810 Age-related osteoporosis without current pathological fracture: Secondary | ICD-10-CM | POA: Diagnosis present

## 2023-04-21 DIAGNOSIS — Z79890 Hormone replacement therapy (postmenopausal): Secondary | ICD-10-CM

## 2023-04-21 DIAGNOSIS — F419 Anxiety disorder, unspecified: Secondary | ICD-10-CM | POA: Diagnosis present

## 2023-04-21 DIAGNOSIS — Z7982 Long term (current) use of aspirin: Secondary | ICD-10-CM

## 2023-04-21 DIAGNOSIS — F32A Depression, unspecified: Secondary | ICD-10-CM | POA: Diagnosis present

## 2023-04-21 DIAGNOSIS — I480 Paroxysmal atrial fibrillation: Secondary | ICD-10-CM | POA: Diagnosis present

## 2023-04-21 DIAGNOSIS — E871 Hypo-osmolality and hyponatremia: Secondary | ICD-10-CM | POA: Diagnosis present

## 2023-04-21 DIAGNOSIS — R5381 Other malaise: Secondary | ICD-10-CM | POA: Diagnosis present

## 2023-04-21 DIAGNOSIS — R739 Hyperglycemia, unspecified: Secondary | ICD-10-CM | POA: Diagnosis present

## 2023-04-21 DIAGNOSIS — K209 Esophagitis, unspecified without bleeding: Secondary | ICD-10-CM | POA: Diagnosis present

## 2023-04-21 DIAGNOSIS — Z87891 Personal history of nicotine dependence: Secondary | ICD-10-CM

## 2023-04-21 DIAGNOSIS — E872 Acidosis, unspecified: Secondary | ICD-10-CM | POA: Diagnosis present

## 2023-04-21 DIAGNOSIS — W1830XA Fall on same level, unspecified, initial encounter: Secondary | ICD-10-CM | POA: Diagnosis present

## 2023-04-21 DIAGNOSIS — Z978 Presence of other specified devices: Secondary | ICD-10-CM | POA: Diagnosis not present

## 2023-04-21 DIAGNOSIS — E162 Hypoglycemia, unspecified: Secondary | ICD-10-CM | POA: Diagnosis not present

## 2023-04-21 DIAGNOSIS — R571 Hypovolemic shock: Secondary | ICD-10-CM | POA: Diagnosis present

## 2023-04-21 DIAGNOSIS — D696 Thrombocytopenia, unspecified: Secondary | ICD-10-CM | POA: Diagnosis not present

## 2023-04-21 DIAGNOSIS — R579 Shock, unspecified: Secondary | ICD-10-CM | POA: Diagnosis not present

## 2023-04-21 DIAGNOSIS — Z791 Long term (current) use of non-steroidal anti-inflammatories (NSAID): Secondary | ICD-10-CM

## 2023-04-21 DIAGNOSIS — E860 Dehydration: Secondary | ICD-10-CM | POA: Diagnosis present

## 2023-04-21 DIAGNOSIS — Z79899 Other long term (current) drug therapy: Secondary | ICD-10-CM

## 2023-04-21 DIAGNOSIS — R55 Syncope and collapse: Secondary | ICD-10-CM

## 2023-04-21 DIAGNOSIS — K921 Melena: Secondary | ICD-10-CM | POA: Diagnosis not present

## 2023-04-21 DIAGNOSIS — D65 Disseminated intravascular coagulation [defibrination syndrome]: Secondary | ICD-10-CM | POA: Diagnosis present

## 2023-04-21 DIAGNOSIS — Y92009 Unspecified place in unspecified non-institutional (private) residence as the place of occurrence of the external cause: Secondary | ICD-10-CM | POA: Diagnosis not present

## 2023-04-21 DIAGNOSIS — J988 Other specified respiratory disorders: Secondary | ICD-10-CM | POA: Diagnosis not present

## 2023-04-21 DIAGNOSIS — Z8249 Family history of ischemic heart disease and other diseases of the circulatory system: Secondary | ICD-10-CM

## 2023-04-21 DIAGNOSIS — Z6839 Body mass index (BMI) 39.0-39.9, adult: Secondary | ICD-10-CM

## 2023-04-21 DIAGNOSIS — K269 Duodenal ulcer, unspecified as acute or chronic, without hemorrhage or perforation: Secondary | ICD-10-CM | POA: Diagnosis not present

## 2023-04-21 DIAGNOSIS — G9340 Encephalopathy, unspecified: Secondary | ICD-10-CM | POA: Diagnosis not present

## 2023-04-21 DIAGNOSIS — J9601 Acute respiratory failure with hypoxia: Secondary | ICD-10-CM | POA: Diagnosis not present

## 2023-04-21 DIAGNOSIS — E039 Hypothyroidism, unspecified: Secondary | ICD-10-CM | POA: Diagnosis present

## 2023-04-21 DIAGNOSIS — I9589 Other hypotension: Secondary | ICD-10-CM | POA: Diagnosis present

## 2023-04-21 DIAGNOSIS — S0083XA Contusion of other part of head, initial encounter: Secondary | ICD-10-CM | POA: Diagnosis present

## 2023-04-21 DIAGNOSIS — F418 Other specified anxiety disorders: Secondary | ICD-10-CM | POA: Diagnosis not present

## 2023-04-21 DIAGNOSIS — T39395A Adverse effect of other nonsteroidal anti-inflammatory drugs [NSAID], initial encounter: Secondary | ICD-10-CM | POA: Diagnosis present

## 2023-04-21 DIAGNOSIS — E875 Hyperkalemia: Secondary | ICD-10-CM | POA: Diagnosis present

## 2023-04-21 DIAGNOSIS — A419 Sepsis, unspecified organism: Secondary | ICD-10-CM

## 2023-04-21 DIAGNOSIS — E876 Hypokalemia: Secondary | ICD-10-CM | POA: Diagnosis not present

## 2023-04-21 DIAGNOSIS — R296 Repeated falls: Secondary | ICD-10-CM | POA: Diagnosis present

## 2023-04-21 LAB — RESP PANEL BY RT-PCR (RSV, FLU A&B, COVID)  RVPGX2
Influenza A by PCR: NEGATIVE
Influenza B by PCR: NEGATIVE
Resp Syncytial Virus by PCR: NEGATIVE
SARS Coronavirus 2 by RT PCR: NEGATIVE

## 2023-04-21 LAB — URINALYSIS, ROUTINE W REFLEX MICROSCOPIC
Bilirubin Urine: NEGATIVE
Glucose, UA: NEGATIVE mg/dL
Hgb urine dipstick: NEGATIVE
Ketones, ur: NEGATIVE mg/dL
Leukocytes,Ua: NEGATIVE
Nitrite: NEGATIVE
Protein, ur: NEGATIVE mg/dL
Specific Gravity, Urine: 1.015 (ref 1.005–1.030)
pH: 5 (ref 5.0–8.0)

## 2023-04-21 LAB — COMPREHENSIVE METABOLIC PANEL
ALT: 21 U/L (ref 0–44)
AST: 40 U/L (ref 15–41)
Albumin: 2.5 g/dL — ABNORMAL LOW (ref 3.5–5.0)
Alkaline Phosphatase: 26 U/L — ABNORMAL LOW (ref 38–126)
Anion gap: 16 — ABNORMAL HIGH (ref 5–15)
BUN: 115 mg/dL — ABNORMAL HIGH (ref 8–23)
CO2: 15 mmol/L — ABNORMAL LOW (ref 22–32)
Calcium: 7.6 mg/dL — ABNORMAL LOW (ref 8.9–10.3)
Chloride: 97 mmol/L — ABNORMAL LOW (ref 98–111)
Creatinine, Ser: 3.05 mg/dL — ABNORMAL HIGH (ref 0.44–1.00)
GFR, Estimated: 16 mL/min — ABNORMAL LOW (ref >60)
Glucose, Bld: 164 mg/dL — ABNORMAL HIGH (ref 70–99)
Potassium: 5.2 mmol/L — ABNORMAL HIGH (ref 3.5–5.1)
Sodium: 128 mmol/L — ABNORMAL LOW (ref 135–145)
Total Bilirubin: 0.7 mg/dL (ref 0.3–1.2)
Total Protein: 4.7 g/dL — ABNORMAL LOW (ref 6.5–8.1)

## 2023-04-21 LAB — BLOOD GAS, VENOUS
Acid-base deficit: 10 mmol/L — ABNORMAL HIGH (ref 0.0–2.0)
Bicarbonate: 15.4 mmol/L — ABNORMAL LOW (ref 20.0–28.0)
O2 Saturation: 23.4 %
Patient temperature: 37
pCO2, Ven: 32 mmHg — ABNORMAL LOW (ref 44–60)
pH, Ven: 7.29 (ref 7.25–7.43)
pO2, Ven: <31 mmHg — CL (ref 32–45)

## 2023-04-21 LAB — TYPE AND SCREEN
Unit division: 0
Unit division: 0

## 2023-04-21 LAB — CBC
HCT: 14 % — ABNORMAL LOW (ref 36.0–46.0)
Hemoglobin: 4.4 g/dL — CL (ref 12.0–15.0)
MCH: 31 pg (ref 26.0–34.0)
MCHC: 31.4 g/dL (ref 30.0–36.0)
MCV: 98.6 fL (ref 80.0–100.0)
Platelets: 243 10*3/uL (ref 150–400)
RBC: 1.42 MIL/uL — ABNORMAL LOW (ref 3.87–5.11)
RDW: 14.6 % (ref 11.5–15.5)
WBC: 55.6 10*3/uL (ref 4.0–10.5)
nRBC: 0.4 % — ABNORMAL HIGH (ref 0.0–0.2)

## 2023-04-21 LAB — LIPASE, BLOOD: Lipase: 35 U/L (ref 11–51)

## 2023-04-21 LAB — PREPARE RBC (CROSSMATCH)

## 2023-04-21 LAB — CBG MONITORING, ED
Glucose-Capillary: 87 mg/dL (ref 70–99)
Glucose-Capillary: 71 mg/dL (ref 70–99)

## 2023-04-21 LAB — APTT: aPTT: 24 seconds (ref 24–36)

## 2023-04-21 LAB — BPAM RBC
Blood Product Expiration Date: 202406202359
Blood Product Expiration Date: 202406202359

## 2023-04-21 LAB — LACTIC ACID, PLASMA
Lactic Acid, Venous: 8.8 mmol/L (ref 0.5–1.9)
Lactic Acid, Venous: 8.9 mmol/L (ref 0.5–1.9)

## 2023-04-21 LAB — PROTIME-INR
INR: 1.3 — ABNORMAL HIGH (ref 0.8–1.2)
Prothrombin Time: 16.6 seconds — ABNORMAL HIGH (ref 11.4–15.2)

## 2023-04-21 MED ORDER — LACTATED RINGERS IV BOLUS (SEPSIS)
1500.0000 mL | Freq: Once | INTRAVENOUS | Status: AC
  Administered 2023-04-21: 1500 mL via INTRAVENOUS

## 2023-04-21 MED ORDER — METRONIDAZOLE 500 MG/100ML IV SOLN
500.0000 mg | Freq: Once | INTRAVENOUS | Status: AC
  Administered 2023-04-21: 500 mg via INTRAVENOUS
  Filled 2023-04-21: qty 100

## 2023-04-21 MED ORDER — DOCUSATE SODIUM 100 MG PO CAPS: 100.0000 mg | ORAL_CAPSULE | Freq: Two times a day (BID) | ORAL | Status: DC | PRN

## 2023-04-21 MED ORDER — PANTOPRAZOLE SODIUM 40 MG IV SOLR
40.0000 mg | Freq: Two times a day (BID) | INTRAVENOUS | Status: DC
  Administered 2023-04-21 – 2023-04-30 (×19): 40 mg via INTRAVENOUS
  Filled 2023-04-21 (×19): qty 10

## 2023-04-21 MED ORDER — SODIUM CHLORIDE 0.9 % IV SOLN
2.0000 g | INTRAVENOUS | Status: DC
  Administered 2023-04-22: 2 g via INTRAVENOUS
  Filled 2023-04-21: qty 12.5

## 2023-04-21 MED ORDER — SODIUM CHLORIDE 0.9 % IV SOLN
2.0000 g | Freq: Once | INTRAVENOUS | Status: AC
  Administered 2023-04-21: 2 g via INTRAVENOUS
  Filled 2023-04-21: qty 12.5

## 2023-04-21 MED ORDER — LACTATED RINGERS IV BOLUS
500.0000 mL | Freq: Once | INTRAVENOUS | Status: AC
  Administered 2023-04-21: 500 mL via INTRAVENOUS

## 2023-04-21 MED ORDER — VANCOMYCIN HCL IN DEXTROSE 1-5 GM/200ML-% IV SOLN
1000.0000 mg | Freq: Once | INTRAVENOUS | Status: AC
  Administered 2023-04-21: 1000 mg via INTRAVENOUS
  Filled 2023-04-21: qty 200

## 2023-04-21 MED ORDER — SODIUM CHLORIDE 0.9 % IV BOLUS
1000.0000 mL | Freq: Once | INTRAVENOUS | Status: AC
  Administered 2023-04-21: 1000 mL via INTRAVENOUS

## 2023-04-21 MED ORDER — SODIUM CHLORIDE 0.9% IV SOLUTION: Freq: Once | INTRAVENOUS | Status: DC

## 2023-04-21 MED ORDER — POLYETHYLENE GLYCOL 3350 17 G PO PACK: 17.0000 g | PACK | Freq: Every day | ORAL | Status: DC | PRN

## 2023-04-21 MED ORDER — LACTATED RINGERS IV SOLN: INTRAVENOUS | Status: DC

## 2023-04-21 NOTE — Sepsis Progress Note (Signed)
Elink monitoring for the code sepsis protocol.  

## 2023-04-21 NOTE — Progress Notes (Signed)
eLink Physician-Brief Progress Note Patient Name: Nicole Peters DOB: Jun 11, 1951 MRN: [id number]   Date of Service  04/21/2023  HPI/Events of Note  72/F with HTN, dyslipidemia, obesity, brought in due to altered mental status and post mechanical fall 3 days prior.  Pt also noted to have black tarry stools. Imaging revealed no acute intracranial abnormality, no acute bony abnormality of the maxillofacial bones and no acute changes of the cervical spine.   Pt found to have hyponatremia Na 128, lactic acidosis of 8.8, AKI with crea 3.05 from baseline crea of 0.96.  Pt also profoundly anemia with hgb of 4.4.   BP 169/153, HR 95, RR 23, O2 sats 100%.    eICU Interventions  Transfuse 2 units pRBCs.  Continue empiric antibiotics.  Follow up cultures.  Continue IVFs.  Monitor I&Os. Trend Na. Encephalopathy likely metabolic and sepsis.   SCDs for DVT prophylaxis.       Intervention Category Evaluation Type: New Patient Evaluation  [name] 04/21/2023, 11:36 PM

## 2023-04-21 NOTE — ED Provider Notes (Signed)
[place] EMERGENCY DEPARTMENT AT [facility] Provider Note   CSN: [id number] Arrival date & time: 04/21/23  1802     History  Chief Complaint  Patient presents with   Altered Mental Status    Nicole Peters is a 72 y.o. female.  HPI   72 year old female with past medical history of HTN, HLD presents to the emergency department with change in mental status.  Patient reportedly had a mechanical fall about 3 days ago in the kitchen.  She fell forward and hit her face.  Unclear if there was loss of consciousness or syncope.  She was not initially evaluated.  Since then she has had extensive facial bruising.  She has had a decline and has been overall weak with a decreased appetite.  To the point that she has been sitting in a recliner at home and not getting up even to use the restroom as a husband has been unable to help.  Husband called EMS today who states on arrival she was confused and was presyncopal with any position change.  Patient herself is oriented, she has no acute complaints.  Husband reveals that she has had episodes of emesis but no documented fever.  Patient denies any chest pain, back pain or abdominal pain.  Home Medications Prior to Admission medications   Medication Sig Start Date End Date Taking? Authorizing Provider  alendronate (FOSAMAX) 70 MG tablet Take 1 tablet (70 mg total) by mouth every 7 (seven) days. Take with a full glass of water on an empty stomach. 09/22/18   Lezlie Lye, Meda Coffee, MD  ALPRAZolam Prudy Feeler) 0.25 MG tablet Take 1-1.5 tablets (0.25-0.375 mg total) by mouth at bedtime as needed for anxiety. 09/22/18   Noni Saupe, MD  aspirin 81 MG chewable tablet Chew by mouth daily.    [provider]  benazepril (LOTENSIN) 20 MG tablet Take 1 tablet (20 mg total) by mouth daily. 03/14/18   Ethelda Chick, MD  busPIRone (BUSPAR) 5 MG tablet Take 0.5 tablets (2.5 mg total) by mouth 2 (two) times daily. 11/03/18   Noni Saupe, MD  cetirizine (ZYRTEC) 10 MG tablet Take 10 mg by mouth daily as needed for allergies.     [provider]  Cholecalciferol (VITAMIN D3) 2000 units capsule Take 2,000 Units by mouth daily.    [provider]  Multiple Vitamin (MULTIVITAMIN) tablet Take 1 tablet by mouth daily.    [provider]  pravastatin (PRAVACHOL) 80 MG tablet Take 1 tablet (80 mg total) by mouth at bedtime. 03/14/18   Ethelda Chick, MD      Allergies    Patient has no known allergies.    Review of Systems   Review of Systems  Constitutional:  Positive for fatigue. Negative for fever.  Respiratory:  Negative for chest tightness and shortness of breath.   Cardiovascular:  Negative for chest pain and palpitations.  Gastrointestinal:  Positive for nausea and vomiting. Negative for abdominal pain and diarrhea.  Genitourinary:  Negative for flank pain.  Musculoskeletal:  Negative for back pain and neck pain.  Skin:  Negative for rash.  Neurological:  Negative for headaches.    Physical Exam Updated Vital Signs BP (!) 96/45 (BP Location: Right Arm)   Pulse 89   Temp (!) 95.8 F (35.4 C) (Rectal)   Resp (!) 24   SpO2 94%  Physical Exam Vitals and nursing note reviewed.  Constitutional:      Appearance:  She is ill-appearing.  HENT:     Head: Normocephalic.     Mouth/Throat:     Mouth: Mucous membranes are moist.  Eyes:     Extraocular Movements: Extraocular movements intact.     Pupils: Pupils are equal, round, and reactive to light.  Cardiovascular:     Rate and Rhythm: Normal rate.  Pulmonary:     Effort: Pulmonary effort is normal. No respiratory distress.  Abdominal:     Palpations: Abdomen is soft.     Tenderness: There is no abdominal tenderness. There is no guarding.  Musculoskeletal:        General: Swelling present.     Cervical back: No tenderness.     Comments: Scattered bruising on all 4 extremities, worse and localized in the bilateral knees  Skin:     General: Skin is warm.  Neurological:     General: No focal deficit present.     Mental Status: She is alert and oriented to person, place, and time. Mental status is at baseline.  Psychiatric:        Mood and Affect: Mood normal.     ED Results / Procedures / Treatments   Labs (all labs ordered are listed, but only abnormal results are displayed) Labs Reviewed  CULTURE, BLOOD (ROUTINE X 2)  CULTURE, BLOOD (ROUTINE X 2)  COMPREHENSIVE METABOLIC PANEL  CBC  LACTIC ACID, PLASMA  LACTIC ACID, PLASMA  URINALYSIS, ROUTINE W REFLEX MICROSCOPIC  BLOOD GAS, VENOUS  LIPASE, BLOOD  CBG MONITORING, ED    EKG None  Radiology No results found.  Procedures .Critical Care  Performed by: Rozelle Logan, DO Authorized by: Rozelle Logan, DO   Critical care provider statement:    Critical care time (minutes):  75   Critical care time was exclusive of:  Separately billable procedures and treating other patients   Critical care was necessary to treat or prevent imminent or life-threatening deterioration of the following conditions:  Renal failure, sepsis, shock, metabolic crisis and dehydration   Critical care was time spent personally by me on the following activities:  Development of treatment plan with patient or surrogate, discussions with consultants, evaluation of patient's response to treatment, examination of patient, ordering and review of laboratory studies, ordering and review of radiographic studies, ordering and performing treatments and interventions, pulse oximetry, re-evaluation of patient's condition and review of old charts   I assumed direction of critical care for this patient from another provider in my specialty: no     Care discussed with: admitting provider       Medications Ordered in ED Medications  sodium chloride 0.9 % bolus 1,000 mL (has no administration in time range)    ED Course/ Medical Decision Making/ A&P                              Medical Decision Making Amount and/or Complexity of Data Reviewed Labs: ordered. Radiology: ordered.  Risk Prescription drug management. Decision regarding hospitalization.   72 year old female presents emergency department after a fall, head injury and significant decline over the past couple days.  She is easily arousable, oriented, has no acute complaints at this time.  She is tachypneic, borderline hypotensive and ill-appearing.  Hypothermic on arrival,  Placed.  Blood work is very concerning.  She is a lactic over 8.8.  New renal failure with mild electrolyte abnormalities.  White blood cell count is over 50 with a new  acute anemia down to 4.4.  The husband at bedside now reveals that the patient has had 2 large melanotic bowel movements over the weekend.  No history of previous GI bleeding or colonoscopies.  She is not on anticoagulation.  Code sepsis ordered.  Antibiotics ordered.  Fluid resuscitation ongoing.  On reevaluation patient feels improved but is still tachypneic and ill-appearing.  Trauma imaging of the face and neck are negative.  Chest x-ray and pelvis show no acute finding.  Knee x-rays are negative.  Type and screen ordered, transfusion orders placed.  ICU consult ordered.  Patient at this time is noted to be full code.  Patients evaluation and results requires admission for further treatment and care.  Spoke with critical care Dr. Everardo All, reviewed patient's ED course and they accept admission.  Patient agrees with admission plan, offers no new complaints and is stable/unchanged at time of admit.        Final Clinical Impression(s) / ED Diagnoses Final diagnoses:  None    Rx / DC Orders ED Discharge Orders     None         Rozelle Logan, DO 04/21/23 2125

## 2023-04-21 NOTE — H&P (Signed)
NAME:  Nicole Peters, MRN:  [id number], DOB:  January 21, 1951, LOS: 0 ADMISSION DATE:  04/21/2023, CONSULTATION DATE:  5/29 REFERRING MD:  Dr. Wilkie Aye, CHIEF COMPLAINT: Syncope  History of Present Illness:  72 year old female with past medical history as below, which is significant for HTN, HLD and obesity.  She presented to [facility] emergency department on 5/29 who presented with altered mental status following a mechanical fall 3 days prior.  She reportedly fell forward striking her face with no reports of syncope or altered level of consciousness.  She declined evaluation at that time.  Since her fall she has developed extensive facial bruising as well as functional decline and decreased appetite.  EMS called 529 due to worsening confusion and positional lightheadedness.  Upon arrival to the emergency department she was alert and oriented.  Imaging workup including CT of the head, face, neck, and x-rays of the chest, pelvis, and both knees were negative.  Laboratory evaluation significant for sodium 128, potassium 5.2, CO2 15, creatinine 3.05, BUN 115, lactic acid 8.8, WBC 55.6, hemoglobin 4.4. PCCM asked to admit to ICU in the setting of renal failure, acidosis, and symptomatic anemia.   Husband reports in the last week she has had reduced appetite and minimal fluid intake. Had episode of dark tarry stool four days ago but no further episodes per husband or patient. No known history of GI bleed. She denies and chest pain, shortness of breath, abdominal pain, nausea. Feeling tired.   Pertinent  Medical History   has a past medical history of Allergy, Anxiety, Arthritis, Depression, History of bronchitis, Hyperlipidemia, Hypertension, Obesity, Osteoporosis, and Stress incontinence.   Significant Hospital Events: Including procedures, antibiotic start and stop dates in addition to other pertinent events     Interim History / Subjective:  As above  Objective   Blood pressure (!) 135/32, pulse  85, temperature (!) 96.9 F (36.1 C), resp. rate (!) 33, SpO2 95 %.       No intake or output data in the 24 hours ending 04/21/23 2049 There were no vitals filed for this visit.  Physical Exam: General: Chronically and critically-appearing, no acute distress HENT: [place], AT, OP clear, MMM Eyes: EOMI, no scleral icterus Respiratory: Clear to auscultation bilaterally.  No crackles, wheezing or rales Cardiovascular: RRR, -M/R/G, no JVD GI: BS+, soft, nontender Extremities:-Edema,-tenderness Neuro: AAO x4, CNII-XII grossly intact Skin: Facial bruising Psych: Normal mood, normal affect   Resolved Hospital Problem list   N/A  Assessment & Plan:   Acute blood loss anemia, suspect slow chronic bleed, currently HDS Hg 12.9 in 2019 and currently 4.4 -PRBC x 2 ordered. F/u post transfusion CBC -Trend CBC -PPI BID  Possible sepsis with severe leukocytosis and lactic acidosis- improved hypotension with IVF resuscitation with 2.5L -Broad spectrum antibiotics. De-escalate pending cultures -Trend LA  AKI with hyperkalemia and metabolic acidosis - Last Cr 0.96 in 11/03/18 - Fluid resuscitation as above - Avoid nephrotoxic agents  HTN -Hold benazepril in setting of AKI  Hypothyroidism -Hold synthroid until stable  Anxiety -Hold home xanex and buspar for tonight  Best Practice (right click and "Reselect all SmartList Selections" daily)   Diet/type: NPO DVT prophylaxis: other GI prophylaxis: N/A Hold/contraindicated Lines: N/A Foley:  N/A Code Status:  full code Last date of multidisciplinary goals of care discussion [ ]   Labs   CBC: Recent Labs  Lab 04/21/23 1845  WBC 55.6*  HGB 4.4*  HCT 14.0*  MCV 98.6  PLT 243  Basic Metabolic Panel: Recent Labs  Lab 04/21/23 1845  NA 128*  K 5.2*  CL 97*  CO2 15*  GLUCOSE 164*  BUN 115*  CREATININE 3.05*  CALCIUM 7.6*   GFR: CrCl cannot be calculated (Unknown ideal weight.). Recent Labs  Lab 04/21/23 1845  WBC  55.6*  LATICACIDVEN 8.8*    Liver Function Tests: Recent Labs  Lab 04/21/23 1845  AST 40  ALT 21  ALKPHOS 26*  BILITOT 0.7  PROT 4.7*  ALBUMIN 2.5*   Recent Labs  Lab 04/21/23 1845  LIPASE 35   No results for input(s): "AMMONIA" in the last 168 hours.  ABG No results found for: "PHART", "PCO2ART", "PO2ART", "HCO3", "TCO2", "ACIDBASEDEF", "O2SAT"   Coagulation Profile: Recent Labs  Lab 04/21/23 1845  INR 1.3*    Cardiac Enzymes: No results for input(s): "CKTOTAL", "CKMB", "CKMBINDEX", "TROPONINI" in the last 168 hours.  HbA1C: Hgb A1c MFr Bld  Date/Time Value Ref Range Status  03/14/2018 01:42 PM 5.5 4.8 - 5.6 % Final    Comment:             Prediabetes: 5.7 - 6.4          Diabetes: >6.4          Glycemic control for adults with diabetes: <7.0   02/11/2015 08:24 AM 5.5 <5.7 % Final    Comment:                                                                           According to the ADA Clinical Practice Recommendations for 2011, when HbA1c is used as a screening test:     >=6.5%   Diagnostic of Diabetes Mellitus            (if abnormal result is confirmed)   5.7-6.4%   Increased risk of developing Diabetes Mellitus   References:Diagnosis and Classification of Diabetes Mellitus,Diabetes Care,2011,34(Suppl 1):S62-S69 and Standards of Medical Care in         Diabetes - 2011,Diabetes Care,2011,34 (Suppl 1):S11-S61.       CBG: Recent Labs  Lab 04/21/23 1812 04/21/23 1922  GLUCAP 87 71    Review of Systems:   Review of Systems  Constitutional:  Negative for chills, diaphoresis, fever, malaise/fatigue and weight loss.  HENT:  Negative for congestion.   Respiratory:  Negative for cough, hemoptysis, sputum production, shortness of breath and wheezing.   Cardiovascular:  Negative for chest pain, palpitations and leg swelling.  Gastrointestinal:  Positive for vomiting.     Past Medical History:  She,  has a past medical history of Allergy,  Anxiety, Arthritis, Depression, History of bronchitis, Hyperlipidemia, Hypertension, Obesity, Osteoporosis, and Stress incontinence.   Surgical History:   Past Surgical History:  Procedure Laterality Date   APPENDECTOMY     Arthroscopic knee surgery     Both knees- Dr. Luiz Blare   CHOLECYSTECTOMY     COLONOSCOPY  12/22/2007   Diverticulosis. [facility]. Repeat in 10 years.   INJECTION KNEE Left 03/16/2016   Procedure: LEFT KNEE CORTISONE INJECTION. ;  Surgeon: Jodi Geralds, MD;  Location: [facility] OR;  Service: Orthopedics;  Laterality: Left;   KNEE SURGERY Bilateral    arthroscopic   LAPAROTOMY  lysis of scar adhesion   TONSILLECTOMY     TOTAL KNEE ARTHROPLASTY Right 03/16/2016   Procedure: RIGHT TOTAL KNEE ARTHROPLASTY ( LATERAL APPROACH) & LEFT KNEE CORTISONE INJECTION. ;  Surgeon: Jodi Geralds, MD;  Location: [facility] OR;  Service: Orthopedics;  Laterality: Right;   TOTAL KNEE ARTHROPLASTY Left 06/24/2018   Procedure: LEFT TOTAL KNEE ARTHROPLASTY;  Surgeon: Jodi Geralds, MD;  Location: [facility] ORS;  Service: Orthopedics;  Laterality: Left;   TUBAL LIGATION       Social History:   reports that she quit smoking about 6 years ago. Her smoking use included cigarettes. She has a 10.00 pack-year smoking history. She has never used smokeless tobacco. She reports that she does not drink alcohol and does not use drugs.   Family History:  Her family history includes Cancer in her father; Heart disease in her father; Heart disease (age of onset: 2) in her brother; Stomach cancer in her father. There is no history of Colon cancer, Colon polyps, Esophageal cancer, or Rectal cancer.   Allergies No Known Allergies   Home Medications  Prior to Admission medications   Medication Sig Start Date End Date Taking? Authorizing Provider  alendronate (FOSAMAX) 70 MG tablet Take 1 tablet (70 mg total) by mouth every 7 (seven) days. Take with a full glass of water on an empty stomach. 09/22/18   Lezlie Lye,  Meda Coffee, MD  ALPRAZolam Prudy Feeler) 0.25 MG tablet Take 1-1.5 tablets (0.25-0.375 mg total) by mouth at bedtime as needed for anxiety. 09/22/18   Noni Saupe, MD  aspirin 81 MG chewable tablet Chew by mouth daily.    [provider]  benazepril (LOTENSIN) 20 MG tablet Take 1 tablet (20 mg total) by mouth daily. 03/14/18   Ethelda Chick, MD  busPIRone (BUSPAR) 5 MG tablet Take 0.5 tablets (2.5 mg total) by mouth 2 (two) times daily. 11/03/18   Noni Saupe, MD  cetirizine (ZYRTEC) 10 MG tablet Take 10 mg by mouth daily as needed for allergies.     [provider]  Cholecalciferol (VITAMIN D3) 2000 units capsule Take 2,000 Units by mouth daily.    [provider]  Multiple Vitamin (MULTIVITAMIN) tablet Take 1 tablet by mouth daily.    [provider]  pravastatin (PRAVACHOL) 80 MG tablet Take 1 tablet (80 mg total) by mouth at bedtime. 03/14/18   Ethelda Chick, MD     Critical care time: 35 min    The patient is critically ill with multiple organ systems failure and requires high complexity decision making for assessment and support, frequent evaluation and titration of therapies, application of advanced monitoring technologies and extensive interpretation of multiple databases.    Mechele Collin, M.D. [facility] Pulmonary/Critical Care Medicine 04/21/2023 10:55 PM   Please see Amion for pager number to reach on-call Pulmonary and Critical Care Team.

## 2023-04-21 NOTE — ED Triage Notes (Signed)
Patient had fall on Saturday and hit face. Patient has bruising noted to face and bilateral knees. Patient was not evaluated for fall due to refusal. Patient began having altered mental status on Monday. Patient has been sitting in chair at home for last two days per husband report. EMS states on arrival patient had some confusion and when she was set up patient she had syncopal episode.

## 2023-04-21 NOTE — Progress Notes (Signed)
Pharmacy Antibiotic Note  Nicole Peters is a 71 y.o. female admitted on 04/21/2023 with sepsis.  Pharmacy has been consulted for vanc/cefepime dosing.  Plan: Vanc 1g x 1 already given. Given AKI and est CrCl of 18 ml/min, will likely check a random vanc tomorrow evening to see if patient is clearing drug and reassess dosing at that time Cefepime 2g IV x 1 already given then start 2g IV q24 per current renal function  Height: 5\' 4"  (162.6 cm) (per husband) Weight: 90.7 kg (200 lb) (per husband) IBW/kg (Calculated) : 54.7  Temp (24hrs), Avg:96.8 F (36 C), Min:95.8 F (35.4 C), Max:97.8 F (36.6 C)  Recent Labs  Lab 04/21/23 1845  WBC 55.6*  CREATININE 3.05*  LATICACIDVEN 8.8*    Estimated Creatinine Clearance: 18.2 mL/min (A) (by C-G formula based on SCr of 3.05 mg/dL (H)).    No Known Allergies    Thank you for allowing pharmacy to be a part of this patient's care.  Berkley Harvey 04/21/2023 9:38 PM

## 2023-04-21 NOTE — Progress Notes (Signed)
A consult was received from an ED physician for vanc/cefepime per pharmacy dosing.  The patient's profile has been reviewed for ht/wt/allergies/indication/available labs.   A one time order has been placed for vanc 1g and cefepime 2g.  Further antibiotics/pharmacy consults should be ordered by admitting physician if indicated.                       Thank you, Berkley Harvey 04/21/2023  7:59 PM

## 2023-04-22 ENCOUNTER — Inpatient Hospital Stay (HOSPITAL_COMMUNITY): Payer: Medicare Other

## 2023-04-22 ENCOUNTER — Other Ambulatory Visit: Payer: Self-pay

## 2023-04-22 DIAGNOSIS — D649 Anemia, unspecified: Secondary | ICD-10-CM

## 2023-04-22 DIAGNOSIS — G9340 Encephalopathy, unspecified: Secondary | ICD-10-CM

## 2023-04-22 DIAGNOSIS — R55 Syncope and collapse: Secondary | ICD-10-CM

## 2023-04-22 DIAGNOSIS — E861 Hypovolemia: Secondary | ICD-10-CM

## 2023-04-22 DIAGNOSIS — D62 Acute posthemorrhagic anemia: Secondary | ICD-10-CM | POA: Diagnosis not present

## 2023-04-22 DIAGNOSIS — D72829 Elevated white blood cell count, unspecified: Secondary | ICD-10-CM

## 2023-04-22 LAB — HEMOGLOBIN AND HEMATOCRIT, BLOOD
HCT: 23.5 % — ABNORMAL LOW (ref 36.0–46.0)
Hemoglobin: 7.8 g/dL — ABNORMAL LOW (ref 12.0–15.0)

## 2023-04-22 LAB — TYPE AND SCREEN
Unit division: 0
Unit division: 0

## 2023-04-22 LAB — CBC
HCT: 18.5 % — ABNORMAL LOW (ref 36.0–46.0)
HCT: 18.2 % — ABNORMAL LOW (ref 36.0–46.0)
Hemoglobin: 6.7 g/dL — CL (ref 12.0–15.0)
Hemoglobin: 6.2 g/dL — CL (ref 12.0–15.0)
MCH: 31.3 pg (ref 26.0–34.0)
MCH: 31 pg (ref 26.0–34.0)
MCHC: 36.2 g/dL — ABNORMAL HIGH (ref 30.0–36.0)
MCHC: 34.1 g/dL (ref 30.0–36.0)
MCV: 86.4 fL (ref 80.0–100.0)
MCV: 91 fL (ref 80.0–100.0)
Platelets: 168 10*3/uL (ref 150–400)
Platelets: 160 10*3/uL (ref 150–400)
RBC: 2.14 MIL/uL — ABNORMAL LOW (ref 3.87–5.11)
RBC: 2 MIL/uL — ABNORMAL LOW (ref 3.87–5.11)
RDW: 14.9 % (ref 11.5–15.5)
RDW: 14.6 % (ref 11.5–15.5)
WBC: 48.9 10*3/uL — ABNORMAL HIGH (ref 4.0–10.5)
WBC: 43.6 10*3/uL — ABNORMAL HIGH (ref 4.0–10.5)
nRBC: 0.8 % — ABNORMAL HIGH (ref 0.0–0.2)
nRBC: 2.7 % — ABNORMAL HIGH (ref 0.0–0.2)

## 2023-04-22 LAB — TECHNOLOGIST SMEAR REVIEW

## 2023-04-22 LAB — IRON AND TIBC
Iron: 157 ug/dL (ref 28–170)
Saturation Ratios: 77 % — ABNORMAL HIGH (ref 10.4–31.8)
TIBC: 203 ug/dL — ABNORMAL LOW (ref 250–450)
UIBC: 46 ug/dL

## 2023-04-22 LAB — DIFFERENTIAL
Abs Immature Granulocytes: 3.9 10*3/uL — ABNORMAL HIGH (ref 0.00–0.07)
Band Neutrophils: 8 %
Basophils Absolute: 0 10*3/uL (ref 0.0–0.1)
Basophils Relative: 0 %
Eosinophils Absolute: 0 10*3/uL (ref 0.0–0.5)
Eosinophils Relative: 0 %
Lymphocytes Relative: 9 %
Lymphs Abs: 4.4 10*3/uL — ABNORMAL HIGH (ref 0.7–4.0)
Metamyelocytes Relative: 2 %
Monocytes Absolute: 1 10*3/uL (ref 0.1–1.0)
Monocytes Relative: 2 %
Myelocytes: 6 %
Neutro Abs: 39.6 10*3/uL — ABNORMAL HIGH (ref 1.7–7.7)
Neutrophils Relative %: 73 %

## 2023-04-22 LAB — C DIFFICILE QUICK SCREEN W PCR REFLEX
C Diff antigen: NEGATIVE
C Diff interpretation: NOT DETECTED
C Diff toxin: NEGATIVE

## 2023-04-22 LAB — POTASSIUM
Potassium: 4.7 mmol/L (ref 3.5–5.1)
Potassium: 4.4 mmol/L (ref 3.5–5.1)
Potassium: 4.2 mmol/L (ref 3.5–5.1)

## 2023-04-22 LAB — FERRITIN: Ferritin: 868 ng/mL — ABNORMAL HIGH (ref 11–307)

## 2023-04-22 LAB — RETICULOCYTES
Immature Retic Fract: 52.4 % — ABNORMAL HIGH (ref 2.3–15.9)
RBC.: 1.99 MIL/uL — ABNORMAL LOW (ref 3.87–5.11)
Retic Count, Absolute: 111.4 10*3/uL (ref 19.0–186.0)
Retic Ct Pct: 5.6 % — ABNORMAL HIGH (ref 0.4–3.1)

## 2023-04-22 LAB — BPAM RBC
Blood Product Expiration Date: 202406212359
ISSUE DATE / TIME: 202405292132
ISSUE DATE / TIME: 202405300755
Unit Type and Rh: 6200
Unit Type and Rh: 6200

## 2023-04-22 LAB — BILIRUBIN, FRACTIONATED(TOT/DIR/INDIR)
Bilirubin, Direct: 0.5 mg/dL — ABNORMAL HIGH (ref 0.0–0.2)
Indirect Bilirubin: 1.3 mg/dL — ABNORMAL HIGH (ref 0.3–0.9)
Total Bilirubin: 1.8 mg/dL — ABNORMAL HIGH (ref 0.3–1.2)

## 2023-04-22 LAB — URINE CULTURE: Culture: NO GROWTH

## 2023-04-22 LAB — BASIC METABOLIC PANEL
Anion gap: 9 (ref 5–15)
BUN: 115 mg/dL — ABNORMAL HIGH (ref 8–23)
CO2: 18 mmol/L — ABNORMAL LOW (ref 22–32)
Calcium: 6.8 mg/dL — ABNORMAL LOW (ref 8.9–10.3)
Chloride: 104 mmol/L (ref 98–111)
Creatinine, Ser: 2.95 mg/dL — ABNORMAL HIGH (ref 0.44–1.00)
GFR, Estimated: 16 mL/min — ABNORMAL LOW (ref >60)
Glucose, Bld: 128 mg/dL — ABNORMAL HIGH (ref 70–99)
Potassium: 5.5 mmol/L — ABNORMAL HIGH (ref 3.5–5.1)
Sodium: 131 mmol/L — ABNORMAL LOW (ref 135–145)

## 2023-04-22 LAB — MAGNESIUM: Magnesium: 1.6 mg/dL — ABNORMAL LOW (ref 1.7–2.4)

## 2023-04-22 LAB — PREPARE RBC (CROSSMATCH)

## 2023-04-22 LAB — VITAMIN B12: Vitamin B-12: 520 pg/mL (ref 180–914)

## 2023-04-22 LAB — CULTURE, BLOOD (ROUTINE X 2): Special Requests: ADEQUATE

## 2023-04-22 LAB — PHOSPHORUS: Phosphorus: 5.5 mg/dL — ABNORMAL HIGH (ref 2.5–4.6)

## 2023-04-22 LAB — TRANSFERRIN: Transferrin: 149 mg/dL — ABNORMAL LOW (ref 192–382)

## 2023-04-22 LAB — LACTIC ACID, PLASMA
Lactic Acid, Venous: 8.5 mmol/L (ref 0.5–1.9)
Lactic Acid, Venous: 3.1 mmol/L (ref 0.5–1.9)

## 2023-04-22 LAB — PATHOLOGIST SMEAR REVIEW

## 2023-04-22 LAB — LACTATE DEHYDROGENASE: LDH: 984 U/L — ABNORMAL HIGH (ref 98–192)

## 2023-04-22 LAB — PROCALCITONIN: Procalcitonin: 0.65 ng/mL

## 2023-04-22 MED ORDER — CHLORHEXIDINE GLUCONATE CLOTH 2 % EX PADS
6.0000 | MEDICATED_PAD | Freq: Every day | CUTANEOUS | Status: DC
  Administered 2023-04-22 – 2023-05-05 (×14): 6 via TOPICAL

## 2023-04-22 MED ORDER — LORAZEPAM 2 MG/ML IJ SOLN
0.5000 mg | INTRAMUSCULAR | Status: DC | PRN
  Administered 2023-04-24: 0.5 mg via INTRAVENOUS
  Administered 2023-04-24 – 2023-04-28 (×7): 1 mg via INTRAVENOUS
  Filled 2023-04-22 (×8): qty 1

## 2023-04-22 MED ORDER — PHENYLEPHRINE HCL-NACL 20-0.9 MG/250ML-% IV SOLN
25.0000 ug/min | INTRAVENOUS | Status: DC
  Administered 2023-04-22: 95 ug/min via INTRAVENOUS
  Administered 2023-04-22: 25 ug/min via INTRAVENOUS
  Filled 2023-04-22 (×2): qty 250

## 2023-04-22 MED ORDER — SODIUM ZIRCONIUM CYCLOSILICATE 10 G PO PACK
10.0000 g | PACK | Freq: Once | ORAL | Status: AC
  Administered 2023-04-22: 10 g via ORAL
  Filled 2023-04-22: qty 1

## 2023-04-22 MED ORDER — SODIUM CHLORIDE 0.9 % IV SOLN
250.0000 mL | INTRAVENOUS | Status: DC
  Administered 2023-04-23: 250 mL via INTRAVENOUS

## 2023-04-22 MED ORDER — NOREPINEPHRINE 4 MG/250ML-% IV SOLN
0.0000 ug/min | INTRAVENOUS | Status: DC
  Administered 2023-04-22 (×2): 12 ug/min via INTRAVENOUS
  Administered 2023-04-23: 20 ug/min via INTRAVENOUS
  Administered 2023-04-23: 15 ug/min via INTRAVENOUS
  Administered 2023-04-23: 13 ug/min via INTRAVENOUS
  Administered 2023-04-23: 30 ug/min via INTRAVENOUS
  Filled 2023-04-22 (×5): qty 250

## 2023-04-22 MED ORDER — SODIUM CHLORIDE 0.9 % IV BOLUS
500.0000 mL | Freq: Once | INTRAVENOUS | Status: AC
  Administered 2023-04-22: 500 mL via INTRAVENOUS

## 2023-04-22 MED ORDER — SODIUM ZIRCONIUM CYCLOSILICATE 10 G PO PACK: 10.0000 g | PACK | Freq: Once | ORAL | Status: DC

## 2023-04-22 MED ORDER — ORAL CARE MOUTH RINSE: 15.0000 mL | OROMUCOSAL | Status: DC | PRN

## 2023-04-22 MED ORDER — SODIUM CHLORIDE 0.9% IV SOLUTION: Freq: Once | INTRAVENOUS | Status: AC

## 2023-04-22 MED ORDER — PROTHROMBIN COMPLEX CONC HUMAN 500 UNITS IV KIT
3791.0000 [IU] | PACK | Status: AC
  Administered 2023-04-22: 3791 [IU] via INTRAVENOUS
  Filled 2023-04-22: qty 3791

## 2023-04-22 MED ORDER — SODIUM CHLORIDE 0.9 % IV SOLN
20.0000 ug | INTRAVENOUS | Status: AC
  Administered 2023-04-22: 20 ug via INTRAVENOUS
  Filled 2023-04-22: qty 5

## 2023-04-22 MED ORDER — METRONIDAZOLE 500 MG/100ML IV SOLN
500.0000 mg | Freq: Three times a day (TID) | INTRAVENOUS | Status: DC
  Administered 2023-04-22 – 2023-04-23 (×4): 500 mg via INTRAVENOUS
  Filled 2023-04-22 (×4): qty 100

## 2023-04-22 MED ORDER — ORAL CARE MOUTH RINSE
15.0000 mL | OROMUCOSAL | Status: DC
  Administered 2023-04-22 – 2023-04-23 (×4): 15 mL via OROMUCOSAL

## 2023-04-22 MED ORDER — DEXMEDETOMIDINE HCL IN NACL 200 MCG/50ML IV SOLN
0.0000 ug/kg/h | INTRAVENOUS | Status: DC
  Administered 2023-04-22: 0.6 ug/kg/h via INTRAVENOUS
  Administered 2023-04-22: 0.4 ug/kg/h via INTRAVENOUS
  Filled 2023-04-22 (×2): qty 50

## 2023-04-22 MED ORDER — MAGNESIUM SULFATE 2 GM/50ML IV SOLN
2.0000 g | Freq: Once | INTRAVENOUS | Status: AC
  Administered 2023-04-22: 2 g via INTRAVENOUS
  Filled 2023-04-22: qty 50

## 2023-04-22 MED ORDER — VANCOMYCIN HCL 750 MG/150ML IV SOLN
750.0000 mg | INTRAVENOUS | Status: DC
  Filled 2023-04-22: qty 150

## 2023-04-22 MED ORDER — CALCIUM GLUCONATE-NACL 1-0.675 GM/50ML-% IV SOLN
1.0000 g | Freq: Once | INTRAVENOUS | Status: AC
  Administered 2023-04-22: 1000 mg via INTRAVENOUS
  Filled 2023-04-22: qty 50

## 2023-04-22 MED ORDER — LACTATED RINGERS IV SOLN: INTRAVENOUS | Status: DC

## 2023-04-22 MED ORDER — SODIUM CHLORIDE 0.9% IV SOLUTION: Freq: Once | INTRAVENOUS | Status: DC

## 2023-04-22 MED ORDER — SODIUM CHLORIDE 0.9 % IV SOLN: 250.0000 mL | INTRAVENOUS | Status: DC

## 2023-04-22 MED ORDER — ALBUTEROL SULFATE (2.5 MG/3ML) 0.083% IN NEBU
2.5000 mg | INHALATION_SOLUTION | RESPIRATORY_TRACT | Status: DC | PRN
  Administered 2023-04-24 – 2023-04-27 (×3): 2.5 mg via RESPIRATORY_TRACT
  Filled 2023-04-22 (×3): qty 3

## 2023-04-22 MED ORDER — IOHEXOL 350 MG/ML SOLN
80.0000 mL | Freq: Once | INTRAVENOUS | Status: AC | PRN
  Administered 2023-04-22: 80 mL via INTRAVENOUS

## 2023-04-22 MED ORDER — SODIUM CHLORIDE 0.9% FLUSH: 10.0000 mL | INTRAVENOUS | Status: DC | PRN

## 2023-04-22 MED ORDER — LACTATED RINGERS IV BOLUS
1000.0000 mL | Freq: Once | INTRAVENOUS | Status: AC
  Administered 2023-04-22: 1000 mL via INTRAVENOUS

## 2023-04-22 MED ORDER — SODIUM CHLORIDE 0.9 % IV SOLN: INTRAVENOUS | Status: DC

## 2023-04-22 MED ORDER — NOREPINEPHRINE 4 MG/250ML-% IV SOLN
2.0000 ug/min | INTRAVENOUS | Status: DC
  Administered 2023-04-22: 2 ug/min via INTRAVENOUS
  Administered 2023-04-22: 8 ug/min via INTRAVENOUS
  Filled 2023-04-22 (×2): qty 250

## 2023-04-22 MED ORDER — ALPRAZOLAM 0.25 MG PO TABS: 0.2500 mg | ORAL_TABLET | Freq: Three times a day (TID) | ORAL | Status: DC | PRN

## 2023-04-22 MED ORDER — SODIUM CHLORIDE 0.9% FLUSH
10.0000 mL | Freq: Two times a day (BID) | INTRAVENOUS | Status: DC
  Administered 2023-04-22: 10 mL
  Administered 2023-04-23: 30 mL
  Administered 2023-04-24 – 2023-04-25 (×2): 10 mL
  Administered 2023-04-26: 30 mL
  Administered 2023-04-26 – 2023-05-05 (×18): 10 mL

## 2023-04-22 MED ORDER — PROTHROMBIN COMPLEX CONC HUMAN 500 UNITS IV KIT
4336.0000 [IU] | PACK | Status: DC
  Filled 2023-04-22: qty 4336

## 2023-04-22 MED ORDER — DIPHENHYDRAMINE HCL 50 MG/ML IJ SOLN
25.0000 mg | Freq: Four times a day (QID) | INTRAMUSCULAR | Status: DC | PRN
  Administered 2023-04-26 – 2023-04-27 (×2): 25 mg via INTRAVENOUS
  Filled 2023-04-22 (×2): qty 1

## 2023-04-22 NOTE — Progress Notes (Signed)
Cross covering ICU physician  Called to assess pt 2/2 resp status and continued bleeding without improvement in hgb. She is currently getting lr at 174ml/hr, another unit of blood this evening and levo.   Her cta has been postponed 2/2 aki, to reiterate note placed by Ms. Bowser at [employer], medical providers have noted her aki and with her continued drop in hgb benefit of cta outweighs risk and pt should indeed have this study done stat.   Will need to follow up result and potential IR consult.   At this time pt's resp status is stable, she is drowsy but arousable and protecting airway. She is on RA and while ever so slightly labored pt and RN at bedside endorse improvement.   She may not avoid intubation as the evening progresses but at this time no clear indication.   I have requested 18g IV be placed with RN if possible. Should she continue to require further transfusion a central vs cordis can be placed. Elink has ordered ddavp and kcentra at this time.   I have placed order for fibrinogen that should be followed and treated should it be low.   At this time we will cont to monitor closely. D/w RN and charge at bedside.

## 2023-04-22 NOTE — TOC Initial Note (Addendum)
Transition of Care [facility]) - Initial/Assessment Note    Patient Details  Name: Nicole Peters MRN: [id number] Date of Birth: 1951/07/30  Transition of Care [facility]) CM/SW Contact:    Lavenia Atlas, RN Phone Number: 04/22/2023, 5:14 PM  Clinical Narrative:      Jayme Cloud consult for request for APS/neglect report. Per TOC consult:  Patient arrived covered in fresh and dried stool (buttocks, legs, hands, abd). Also had strong smell of urine on skin. Multiple recent falls. Bruising of various states of healing. Fungal yeast to abd folds and groin areas. Nails long and unkept. Husband at bedside wheelchair bound and also smells of stool/urine    This RNCM spoke with patient at bedside is confused however attempts to answer questions then retracts her answer by saying "No I didn't". Patient reports she and her husband live together and he transport to her MD appointments. Per chart review patient does not have any recent ED or hospitalizations.    APS report made, awaiting a call back.  No current TOC needs, will continue to follow.               Expected Discharge Plan:  (unknown at this time) Barriers to Discharge: Continued Medical Work up   Patient Goals and CMS Choice Patient states their goals for this hospitalization and ongoing recovery are:: feel better CMS Medicare.gov Compare Post Acute Care list provided to:: Patient Choice offered to / list presented to : Patient [place] ownership interest in [facility].provided to:: Patient    Expected Discharge Plan and Services In-house Referral: NA Discharge Planning Services: CM Consult Post Acute Care Choice: NA Living arrangements for the past 2 months: Single Family Home                 DME Arranged: N/A DME Agency: NA       HH Arranged: NA HH Agency: NA        Prior Living Arrangements/Services Living arrangements for the past 2 months: Single Family Home Lives with:: Spouse Patient language and need  for interpreter reviewed:: Yes Do you feel safe going back to the place where you live?: Yes      Need for Family Participation in Patient Care: Yes (Comment) Care giver support system in place?: Yes (comment) (spouse) Current home services: Other (comment) (unknown) Criminal Activity/Legal Involvement Pertinent to Current Situation/Hospitalization: No - Comment as needed  Activities of Daily Living Home Assistive Devices/Equipment: None ADL Screening (condition at time of admission) Patient's cognitive ability adequate to safely complete daily activities?: No Is the patient deaf or have difficulty hearing?: No Does the patient have difficulty seeing, even when wearing glasses/contacts?: No Does the patient have difficulty concentrating, remembering, or making decisions?: Yes Patient able to express need for assistance with ADLs?: Yes Does the patient have difficulty dressing or bathing?: No Independently performs ADLs?: Yes (appropriate for developmental age) Does the patient have difficulty walking or climbing stairs?: Yes Weakness of Legs: Both Weakness of Arms/Hands: None  Permission Sought/Granted Permission sought to share information with : Case Manager Permission granted to share information with : Yes, Verbal Permission Granted  Share Information with NAME: Care manager           Emotional Assessment Appearance:: Appears stated age Attitude/Demeanor/Rapport: Gracious, Other (comment) (confused) Affect (typically observed): Calm Orientation: : Oriented to Self, Fluctuating Orientation (Suspected and/or reported Sundowners) Alcohol / Substance Use: Other (comment) (UTA) Psych Involvement: No (comment)  Admission diagnosis:  Acute blood loss  anemia [D62] Anemia, unspecified type [D64.9] Sepsis, due to unspecified organism, unspecified whether acute organ dysfunction present [facility]) [A41.9] Patient Active Problem List   Diagnosis Date Noted   Encephalopathy acute  04/22/2023   Anemia 04/22/2023   Leukocytosis 04/22/2023   Syncope 04/22/2023   Hypotension due to hypovolemia 04/22/2023   Acute blood loss anemia 04/21/2023   Primary osteoarthritis of left knee 06/24/2018   Age-related osteoporosis without current pathological fracture 10/22/2016   Pure hypercholesterolemia 08/12/2015   Primary osteoarthritis of both knees 08/12/2015   Hematuria 08/13/2014   DJD (degenerative joint disease) 10/18/2012   Depression with anxiety 10/18/2012   Obesity (BMI 30.0-34.9) 10/14/2012   HTN (hypertension) 06/10/2012   PCP:  Lezlie Lye, Meda Coffee, MD Pharmacy:   [id number] - [place],  - [address] AT [facility] [address] [place] [address] Phone: [phone] Fax: [phone]     Social Determinants of Health (SDOH) Social History: SDOH Screenings   Depression (PHQ2-9): Low Risk  (09/16/2019)  Tobacco Use: Medium Risk (04/21/2023)   SDOH Interventions:     Readmission Risk Interventions     No data to display

## 2023-04-22 NOTE — Progress Notes (Signed)
eLink Physician-Brief Progress Note Patient Name: Nicole Peters DOB: [birth year] MRN: [id number]   Date of Service  04/22/2023  HPI/Events of Note  Notified of K 5.5, crea 2.95, Mg 1.6, calcium corrected for low albumin is 8.0. Hgb at 6.7 <-- 4.4 after 2 units pRBCs.  Lactic acid 8.5 --> 3.1  eICU Interventions  Transfuse 3rd unit pRBC.  Give calcium gluconate and lokelma. Give MgSO4.       Intervention Category Intermediate Interventions: Electrolyte abnormality - evaluation and management  [name] 04/22/2023, 6:24 AM

## 2023-04-22 NOTE — Progress Notes (Signed)
eLink Physician-Brief Progress Note Patient Name: Nicole Peters DOB: [birth year] MRN: [id number]   Date of Service  04/22/2023  HPI/Events of Note  Patient continues to bleed.  She is receiving her second unit of blood tonight.  She had her CT angiogram done earlier this evening.  Radiology called me and stated that patient has bleeding from around her duodenum.  Bleeding is still active. Patient lost her Foley during transport.  eICU Interventions  I spoke to the interventional radiologist.  They would prefer for GI to see patient in scope and if they have any difficulties then IR will come in with embolization.  GI has been paged. Foley catheter insertion ordered.     Intervention Category Major Interventions: Hemorrhage - evaluation and management  Carilyn Goodpasture 04/22/2023, 11:40 PM

## 2023-04-22 NOTE — Progress Notes (Signed)
Notified PCCM doctor of need for central line. Ordered meds and blood not compatible and only two PIVs present. PCCM stated ground team will place line this AM. IV team consult placed for additional PIV until ground team can place line

## 2023-04-22 NOTE — Progress Notes (Signed)
Pharmacy Antibiotic Note  Nicole Peters is a 72 y.o. female admitted on 04/21/2023 with sepsis, acute blood loss anemia, and AKI.  Pharmacy has been consulted for cefepime and vancomycin dosing.  Today, 04/22/23 SCr 2.95 slightly improved but remains elevated. Baseline unknown - SCr 1.24 in 2019 Hypothermic WBC significantly elevated Requiring pressors  Plan: Continue cefepime 2 g IV q24. Flagyl per MD. Vancomycin 1000 mg dose given on 5/29. Will order maintenance dose of 750 mg IV q48h for estimated AUC of 462. Will give second dose ~24 hours after initial dose to complete load Goal AUC 400-550. Check levels as needed Monitor renal function, culture data.   Height: 5\' 4"  (162.6 cm) Weight: 97 kg (213 lb 13.5 oz) IBW/kg (Calculated) : 54.7  Temp (24hrs), Avg:98.1 F (36.7 C), Min:95.8 F (35.4 C), Max:100.3 F (37.9 C)  Recent Labs  Lab 04/21/23 1845 04/21/23 2149 04/22/23 0154 04/22/23 0535  WBC 55.6*  --   --  48.9*  CREATININE 3.05*  --   --  2.95*  LATICACIDVEN 8.8* 8.9* 8.5* 3.1*    Estimated Creatinine Clearance: 19.5 mL/min (A) (by C-G formula based on SCr of 2.95 mg/dL (H)).    No Known Allergies  Antimicrobials this admission: 5/29 cefepime >>  5/29 vancomycin >>  5/29 metronidazole >>  Dose adjustments this admission:  Microbiology results: 5/29 BCx: ngtd 5/29 UCx:   5/29 MRSA PCR:  5/30 C.diff: Conception Chancy, PharmD 04/22/2023 9:35 AM

## 2023-04-22 NOTE — Progress Notes (Signed)
eLink Physician-Brief Progress Note Patient Name: Nicole Peters DOB: December 06, 1950 MRN: [id number]   Date of Service  04/22/2023  HPI/Events of Note  Notified of wheezing on lung exam by the RN.  Car on admission with no acute infiltrates.    RN also reported that foley temp is not picking up temperature. Axillary temperature noted at 97.9F.   Pt is saturating at 96% on 2L nasal cannula but slightly tachypneic.  eICU Interventions  Continue to trend axillary temp.  Albuterol via nebs ordered.     Intervention Category Intermediate Interventions: Other:  [name] 04/22/2023, 12:06 AM

## 2023-04-22 NOTE — Progress Notes (Signed)
Received protocol consult for US guided IV due to vasopressor order. Pt currently has US guided IV started by VAS Team; confirmed vasopressor is infusing through this site.  A correctly placed ivWatch must be used when administering Vasopressors. Should this treatment be needed beyond 72 hours, central line access should be obtained.  It will be the responsibility of the bedside nurse to follow best practice to prevent extravasations.

## 2023-04-22 NOTE — Progress Notes (Signed)
Peripherally Inserted Central Catheter Placement  The IV Nurse has discussed with the patient and/or persons authorized to consent for the patient, the purpose of this procedure and the potential benefits and risks involved with this procedure.  The benefits include less needle sticks, lab draws from the catheter, and the patient may be discharged home with the catheter. Risks include, but not limited to, infection, bleeding, blood clot (thrombus formation), and puncture of an artery; nerve damage and irregular heartbeat and possibility to perform a PICC exchange if needed/ordered by physician.  Alternatives to this procedure were also discussed.  Bard Power PICC patient education guide, fact sheet on infection prevention and patient information card has been provided to patient /or left at bedside.    PICC Placement Documentation  PICC Triple Lumen 04/22/23 Right Cephalic 39 cm (Active)  Indication for Insertion or Continuance of Line Vasoactive infusions 04/22/23 1300  Exposed Catheter (cm) 0 cm 04/22/23 1300  Site Assessment Clean, Dry, Intact 04/22/23 1300  Lumen #1 Status Flushed;Saline locked;Blood return noted 04/22/23 1300  Lumen #2 Status Flushed;Saline locked;Blood return noted 04/22/23 1300  Lumen #3 Status Flushed;Saline locked;Blood return noted 04/22/23 1300  Dressing Type Transparent;Securing device 04/22/23 1300  Dressing Status Antimicrobial disc in place 04/22/23 1300  Safety Lock Not Applicable 04/22/23 1300  Line Care Connections checked and tightened 04/22/23 1300  Line Adjustment (NICU/IV Team Only) No 04/22/23 1300  Dressing Intervention New dressing 04/22/23 1300  Dressing Change Due 04/29/23 04/22/23 1300       Nicole Peters 04/22/2023, 1:30 PM

## 2023-04-22 NOTE — Progress Notes (Addendum)
eLink Physician-Brief Progress Note Patient Name: Nicole Peters DOB: 1951-05-19 MRN: [id number]   Date of Service  04/22/2023  HPI/Events of Note  Pt is agitated, pulling at line and equipment.  Pt is yelling at husband when she is being redirected.   eICU Interventions  Start on precedex gtt.      Intervention Category Intermediate Interventions: Other:  [name] 04/22/2023, 12:41 AM  2:05 AM Notified of hypotension with BP 96/41 while on precedex at 0.104mcg/kg/min.   Pt has received 1 unit pRBC.  Plan> Give 500cc NS bolus.  Titrate precedex down.  2:24 AM BP 129/97 with IVF bolus on-going.  RN reporting rust colored stool.   Plan> Start on NS@ 100cc.hr.  Transfuse 2nd unit pRBC.  3:36 AM Blood pressure dropping, now at 69/22 with IVFs and second unit pRBC running.   Plan> Start on levophed peripherally.   5:12 AM Pt is on max dose of peripheral levophed with BP 97/19.  On recheck, 100/40.  Pt is finishing second unit pRBC.   Plan> Give another 500cc NS bolus.  Continue on peripheral levophed.  5:37 AM Blood pressure on follow up at 121/35, HR 97, O2 sats 100%.   6:01 AM As soon as IVF bolus finishes, BP back to 86/47.   Plan> Start on neosynephrine peripherally now.  Will discuss with day team need for central line.

## 2023-04-22 NOTE — Progress Notes (Signed)
Incr melena this afternoon with very recent large volume melena, associated worsening anemia despite product resusc and ongoing shock   At this point, risk/benefit for CTA GI in pt will AKI do point to the benefits outweighing risk.   Order is placed for STAT CTA GI protocol May need IR consult pending result Add'l 1 PRBC, H/H post transfusion   Tessie Fass MSN, AGACNP-BC [facility] Pulmonary/Critical Care Medicine 04/22/2023, 6:51 PM

## 2023-04-22 NOTE — Progress Notes (Signed)
NAME:  Nicole Peters, MRN:  [id number], DOB:  04-01-1951, LOS: 1 ADMISSION DATE:  04/21/2023, CONSULTATION DATE:  5/29 REFERRING MD:  Dr. Wilkie Aye, CHIEF COMPLAINT: Syncope  History of Present Illness:  72 year old female with past medical history as below, which is significant for HTN, HLD and obesity.  She presented to [facility] emergency department on 5/29 who presented with altered mental status following a mechanical fall 3 days prior.  She reportedly fell forward striking her face with no reports of syncope or altered level of consciousness.  She declined evaluation at that time.  Since her fall she has developed extensive facial bruising as well as functional decline and decreased appetite.  EMS called 529 due to worsening confusion and positional lightheadedness.  Upon arrival to the emergency department she was alert and oriented.  Imaging workup including CT of the head, face, neck, and x-rays of the chest, pelvis, and both knees were negative.  Laboratory evaluation significant for sodium 128, potassium 5.2, CO2 15, creatinine 3.05, BUN 115, lactic acid 8.8, WBC 55.6, hemoglobin 4.4. PCCM asked to admit to ICU in the setting of renal failure, acidosis, and symptomatic anemia.   Husband reports in the last week she has had reduced appetite and minimal fluid intake. Had episode of dark tarry stool four days ago but no further episodes per husband or patient. No known history of GI bleed. She denies and chest pain, shortness of breath, abdominal pain, nausea. Feeling tired.   Pertinent  Medical History   has a past medical history of Allergy, Anxiety, Arthritis, Depression, History of bronchitis, Hyperlipidemia, Hypertension, Obesity, Osteoporosis, and Stress incontinence.   Significant Hospital Events: Including procedures, antibiotic start and stop dates in addition to other pertinent events   5/29 admitted after AMS/ syncope, hgb 4.4 5/30 started on low dose pressors, more confused,  getting add'l PRBC   Interim History / Subjective:   On low dose neo and ne   3rd prbc ordered overnight but has not yet been given  BM overnight with blood clots but also quite foul   Objective   Blood pressure (!) 111/23, pulse 90, temperature 99 F (37.2 C), resp. rate (!) 30, height 5\' 4"  (1.626 m), weight 97 kg, SpO2 97 %.        Intake/Output Summary (Last 24 hours) at 04/22/2023 0936 Last data filed at 04/22/2023 0700 Gross per 24 hour  Intake 3226.12 ml  Output 195 ml  Net 3031.12 ml   Filed Weights   04/21/23 2137 04/22/23 0104  Weight: 90.7 kg 97 kg    Physical Exam: General: acutely and chronically ill odler adult F  Neuro: awake, oriented x1-2, intermittently lethargic  HENT: [place]. dry mm  Respiratory: wheezy cough. Symmetrical chest expansion  Cardiovascular: rr cap refill < 3 sec  GI: soft round  Extremities: no acute joint deformity  Skin: Facial bruising and knee bruising  Psych: periods of giddiness   Resolved Hospital Problem list   N/A  Assessment & Plan:   Acute encephalopathy Syncope -felt most likely 2/ 2 anemia P -resusc as below  -delirium precautions   Shock -hypovolemic (bleed and dehydrated) +/- septic +/- med related  P -add'l 1L LR then mIVF -needs to get 3rd PRBC -abx to include abd coverage  -dc precedex   ABLA  -interestingly plt are fine, which seems a bit disproportionate P -receiving 3rd PRBC  -f/u H/H post transfusion  -BID PPI -defer GI consult for now   -will  order add'l 1g calglu for total 2g   Suspected sepsis Leukocytosis  -bcx pending, UA ok, cxr ok  -documented loose stool -- ? Cdiff (vs loose stoll 2/2 melena) -send cdiff  -cefepime flagyl  -check pct   AKI NAGMA -follow UOP, renal indices   Lactic acidosis, improving  -trend PRN   Hyponatremia, mild Hyperkalemia  Hyperphosphatemia Hypomagnesemia  Hypocalcemia  -hyperK w transfusions  P -getting 2g cal glu  -getting 2g mag -dont think  we can take POs well enough for lokelma, but Ca above should help  -will repeat K mid day and follow, might need tx    HTN P -hold w hypotension   Hypothyroidism  -holding home synthroid, have a window before we would need to make IV   Anxiety  -started on precedex -will add IV bzd, benadryl. Wean off precedex   Best Practice (right click and "Reselect all SmartList Selections" daily)   Diet/type: NPO DVT prophylaxis: other SCD  GI prophylaxis: N/A  PPI  Lines: N/A Foley:  N/A Code Status:  full code Last date of multidisciplinary goals of care discussion [ ]   Labs   CBC: Recent Labs  Lab 04/21/23 1845 04/22/23 0535  WBC 55.6* 48.9*  NEUTROABS  --  39.6*  HGB 4.4* 6.7*  HCT 14.0* 18.5*  MCV 98.6 86.4  PLT 243 168    Basic Metabolic Panel: Recent Labs  Lab 04/21/23 1845 04/22/23 0535  NA 128* 131*  K 5.2* 5.5*  CL 97* 104  CO2 15* 18*  GLUCOSE 164* 128*  BUN 115* 115*  CREATININE 3.05* 2.95*  CALCIUM 7.6* 6.8*  MG  --  1.6*  PHOS  --  5.5*   GFR: Estimated Creatinine Clearance: 19.5 mL/min (A) (by C-G formula based on SCr of 2.95 mg/dL (H)). Recent Labs  Lab 04/21/23 1845 04/21/23 2149 04/22/23 0154 04/22/23 0535  PROCALCITON  --   --   --  0.65  WBC 55.6*  --   --  48.9*  LATICACIDVEN 8.8* 8.9* 8.5* 3.1*    Liver Function Tests: Recent Labs  Lab 04/21/23 1845  AST 40  ALT 21  ALKPHOS 26*  BILITOT 0.7  PROT 4.7*  ALBUMIN 2.5*   Recent Labs  Lab 04/21/23 1845  LIPASE 35   No results for input(s): "AMMONIA" in the last 168 hours.  ABG    Component Value Date/Time   HCO3 15.4 (L) 04/21/2023 2019   ACIDBASEDEF 10.0 (H) 04/21/2023 2019   O2SAT 23.4 04/21/2023 2019     Coagulation Profile: Recent Labs  Lab 04/21/23 1845  INR 1.3*    Cardiac Enzymes: No results for input(s): "CKTOTAL", "CKMB", "CKMBINDEX", "TROPONINI" in the last 168 hours.  HbA1C: Hgb A1c MFr Bld  Date/Time Value Ref Range Status  03/14/2018 01:42 PM  5.5 4.8 - 5.6 % Final    Comment:             Prediabetes: 5.7 - 6.4          Diabetes: >6.4          Glycemic control for adults with diabetes: <7.0   02/11/2015 08:24 AM 5.5 <5.7 % Final    Comment:  According to the ADA Clinical Practice Recommendations for 2011, when HbA1c is used as a screening test:     >=6.5%   Diagnostic of Diabetes Mellitus            (if abnormal result is confirmed)   5.7-6.4%   Increased risk of developing Diabetes Mellitus   References:Diagnosis and Classification of Diabetes Mellitus,Diabetes Care,2011,34(Suppl 1):S62-S69 and Standards of Medical Care in         Diabetes - 2011,Diabetes Care,2011,34 (Suppl 1):S11-S61.       CBG: Recent Labs  Lab 04/21/23 1812 04/21/23 1922  GLUCAP 87 71    CRITICAL CARE Performed by: Lanier Clam   Total critical care time: 42 minutes  Critical care time was exclusive of separately billable procedures and treating other patients. Critical care was necessary to treat or prevent imminent or life-threatening deterioration.  Critical care was time spent personally by me on the following activities: development of treatment plan with patient and/or surrogate as well as nursing, discussions with consultants, evaluation of patient's response to treatment, examination of patient, obtaining history from patient or surrogate, ordering and performing treatments and interventions, ordering and review of laboratory studies, ordering and review of radiographic studies, pulse oximetry and re-evaluation of patient's condition.  Tessie Fass MSN, AGACNP-BC [facility] Pulmonary/Critical Care Medicine Amion for pager  04/22/2023, 9:36 AM

## 2023-04-22 NOTE — Progress Notes (Signed)
eLink Physician-Brief Progress Note Patient Name: Nicole Peters DOB: 05/18/1951 MRN: [id number]   Date of Service  04/22/2023  HPI/Events of Note  72 year old female in ICU with GI bleed.  She is still passing blood clots.  She has dropped her hemoglobin from approximately 13 down to 6 despite having received 3 units of packed red cells already and is currently getting her fourth unit.  That means she has lost at least 10 units of blood this admission.  She is in renal failure and she is in shock.  I was asked to evaluate her by daytime intensivist because of her instability.  eICU Interventions  Patient evaluated on camera.   She appears tachypneic and is using accessory muscles to breathe.  She is encephalopathic.  She remains on vasopressors. Even though she is on no anticoagulants, I am going to order a dose of Kcentra.  As well, I will order a dose of DDAVP because she has uremia even though we have no evidence of platelet dysfunction.  Will transfuse her 2 more units of packed red cells.  She is scheduled to go for a CT angiogram to identify source of bleed and possible transfer to a center with IR capabilities for embolization. With regards to her tachypnea and respiratory distress, I am anticipating that this patient will progress to frank respiratory failure with the amount of volume that she is getting and also the possibility of TRALI.  I have discussed with Dr. Gaynell Face and she will evaluate patient for possible elective intubation.  Will continue to titrate pressors for hemodynamic stability and transfuse as needed until hemostasis is achieved.  Plan discussed with bedside nurse and the ICU nurse.        Aydin Hink 04/22/2023, 8:16 PM

## 2023-04-23 ENCOUNTER — Inpatient Hospital Stay (HOSPITAL_COMMUNITY): Payer: Medicare Other

## 2023-04-23 ENCOUNTER — Encounter (HOSPITAL_COMMUNITY)
Admission: EM | Disposition: A | Discharge status: Skilled Nursing Facility | Payer: Self-pay | Source: Home / Self Care | Attending: Internal Medicine

## 2023-04-23 DIAGNOSIS — Z978 Presence of other specified devices: Secondary | ICD-10-CM

## 2023-04-23 DIAGNOSIS — R578 Other shock: Secondary | ICD-10-CM

## 2023-04-23 DIAGNOSIS — K922 Gastrointestinal hemorrhage, unspecified: Secondary | ICD-10-CM

## 2023-04-23 DIAGNOSIS — R579 Shock, unspecified: Secondary | ICD-10-CM

## 2023-04-23 DIAGNOSIS — N179 Acute kidney failure, unspecified: Secondary | ICD-10-CM | POA: Diagnosis not present

## 2023-04-23 DIAGNOSIS — D62 Acute posthemorrhagic anemia: Secondary | ICD-10-CM | POA: Diagnosis not present

## 2023-04-23 DIAGNOSIS — D696 Thrombocytopenia, unspecified: Secondary | ICD-10-CM

## 2023-04-23 HISTORY — PX: IR ANGIOGRAM VISCERAL SELECTIVE: IMG657

## 2023-04-23 HISTORY — PX: IR EMBO TUMOR ORGAN ISCHEMIA INFARCT INC GUIDE ROADMAPPING: IMG5449

## 2023-04-23 HISTORY — PX: IR ANGIOGRAM SELECTIVE EACH ADDITIONAL VESSEL: IMG667

## 2023-04-23 HISTORY — PX: IR US GUIDE VASC ACCESS RIGHT: IMG2390

## 2023-04-23 LAB — BPAM RBC
Blood Product Expiration Date: 202406202359
ISSUE DATE / TIME: 202405300256
ISSUE DATE / TIME: 202405301824
Unit Type and Rh: 6200
Unit Type and Rh: 6200
Unit Type and Rh: 6200

## 2023-04-23 LAB — DIC (DISSEMINATED INTRAVASCULAR COAGULATION)PANEL
D-Dimer, Quant: 2.23 ug/mL-FEU — ABNORMAL HIGH (ref 0.00–0.50)
D-Dimer, Quant: 2.26 ug/mL-FEU — ABNORMAL HIGH (ref 0.00–0.50)
D-Dimer, Quant: 3.7 ug/mL-FEU — ABNORMAL HIGH (ref 0.00–0.50)
Fibrinogen: 195 mg/dL — ABNORMAL LOW (ref 210–475)
Fibrinogen: 356 mg/dL (ref 210–475)
Fibrinogen: 328 mg/dL (ref 210–475)
INR: 1.4 — ABNORMAL HIGH (ref 0.8–1.2)
INR: 1.2 (ref 0.8–1.2)
INR: 1.2 (ref 0.8–1.2)
Platelets: 104 10*3/uL — ABNORMAL LOW (ref 150–400)
Platelets: 95 10*3/uL — ABNORMAL LOW (ref 150–400)
Platelets: 92 10*3/uL — ABNORMAL LOW (ref 150–400)
Prothrombin Time: 17.3 seconds — ABNORMAL HIGH (ref 11.4–15.2)
Prothrombin Time: 15.8 seconds — ABNORMAL HIGH (ref 11.4–15.2)
Prothrombin Time: 15.4 seconds — ABNORMAL HIGH (ref 11.4–15.2)
Smear Review: NONE SEEN
Smear Review: NONE SEEN
Smear Review: NONE SEEN
aPTT: 32 seconds (ref 24–36)
aPTT: 29 seconds (ref 24–36)
aPTT: 31 seconds (ref 24–36)

## 2023-04-23 LAB — FIBRINOGEN
Fibrinogen: 167 mg/dL — ABNORMAL LOW (ref 210–475)
Fibrinogen: 167 mg/dL — ABNORMAL LOW (ref 210–475)

## 2023-04-23 LAB — GASTROINTESTINAL PANEL BY PCR, STOOL (REPLACES STOOL CULTURE)

## 2023-04-23 LAB — BASIC METABOLIC PANEL
Anion gap: 5 (ref 5–15)
BUN: 51 mg/dL — ABNORMAL HIGH (ref 8–23)
CO2: 22 mmol/L (ref 22–32)
Calcium: 7.7 mg/dL — ABNORMAL LOW (ref 8.9–10.3)
Chloride: 109 mmol/L (ref 98–111)
Creatinine, Ser: 1.14 mg/dL — ABNORMAL HIGH (ref 0.44–1.00)
GFR, Estimated: 51 mL/min — ABNORMAL LOW (ref >60)
Glucose, Bld: 139 mg/dL — ABNORMAL HIGH (ref 70–99)
Potassium: 4 mmol/L (ref 3.5–5.1)
Sodium: 136 mmol/L (ref 135–145)

## 2023-04-23 LAB — CBC
HCT: 21.3 % — ABNORMAL LOW (ref 36.0–46.0)
HCT: 24.1 % — ABNORMAL LOW (ref 36.0–46.0)
Hemoglobin: 6.9 g/dL — CL (ref 12.0–15.0)
Hemoglobin: 8.2 g/dL — ABNORMAL LOW (ref 12.0–15.0)
MCH: 30.9 pg (ref 26.0–34.0)
MCH: 30.9 pg (ref 26.0–34.0)
MCHC: 32.4 g/dL (ref 30.0–36.0)
MCHC: 34 g/dL (ref 30.0–36.0)
MCV: 95.5 fL (ref 80.0–100.0)
MCV: 90.9 fL (ref 80.0–100.0)
Platelets: 104 10*3/uL — ABNORMAL LOW (ref 150–400)
Platelets: 92 10*3/uL — ABNORMAL LOW (ref 150–400)
RBC: 2.23 MIL/uL — ABNORMAL LOW (ref 3.87–5.11)
RBC: 2.65 MIL/uL — ABNORMAL LOW (ref 3.87–5.11)
RDW: 15.5 % (ref 11.5–15.5)
RDW: 15.4 % (ref 11.5–15.5)
WBC: 41.6 10*3/uL — ABNORMAL HIGH (ref 4.0–10.5)
WBC: 41.6 10*3/uL — ABNORMAL HIGH (ref 4.0–10.5)
nRBC: 6.7 % — ABNORMAL HIGH (ref 0.0–0.2)
nRBC: 7.1 % — ABNORMAL HIGH (ref 0.0–0.2)

## 2023-04-23 LAB — TYPE AND SCREEN
Antibody Screen: NEGATIVE
Unit division: 0

## 2023-04-23 LAB — POTASSIUM
Potassium: 3.5 mmol/L (ref 3.5–5.1)
Potassium: 3.5 mmol/L (ref 3.5–5.1)

## 2023-04-23 LAB — PREPARE CRYOPRECIPITATE

## 2023-04-23 LAB — COMPREHENSIVE METABOLIC PANEL
ALT: 333 U/L — ABNORMAL HIGH (ref 0–44)
AST: 241 U/L — ABNORMAL HIGH (ref 15–41)
Albumin: 2 g/dL — ABNORMAL LOW (ref 3.5–5.0)
Alkaline Phosphatase: 25 U/L — ABNORMAL LOW (ref 38–126)
Anion gap: 8 (ref 5–15)
BUN: 65 mg/dL — ABNORMAL HIGH (ref 8–23)
CO2: 18 mmol/L — ABNORMAL LOW (ref 22–32)
Calcium: 7.2 mg/dL — ABNORMAL LOW (ref 8.9–10.3)
Chloride: 109 mmol/L (ref 98–111)
Creatinine, Ser: 1.48 mg/dL — ABNORMAL HIGH (ref 0.44–1.00)
GFR, Estimated: 37 mL/min — ABNORMAL LOW (ref >60)
Glucose, Bld: 176 mg/dL — ABNORMAL HIGH (ref 70–99)
Potassium: 3.9 mmol/L (ref 3.5–5.1)
Sodium: 135 mmol/L (ref 135–145)
Total Bilirubin: 1.2 mg/dL (ref 0.3–1.2)
Total Protein: 3.7 g/dL — ABNORMAL LOW (ref 6.5–8.1)

## 2023-04-23 LAB — BLOOD GAS, ARTERIAL
Acid-base deficit: 7 mmol/L — ABNORMAL HIGH (ref 0.0–2.0)
FIO2: 100 %
Patient temperature: 36.8
pCO2 arterial: 40 mmHg (ref 32–48)
pH, Arterial: 7.29 — ABNORMAL LOW (ref 7.35–7.45)

## 2023-04-23 LAB — BPAM CRYOPRECIPITATE: Unit Type and Rh: 6200

## 2023-04-23 LAB — PHOSPHORUS: Phosphorus: 3.1 mg/dL (ref 2.5–4.6)

## 2023-04-23 LAB — HEMOGLOBIN AND HEMATOCRIT, BLOOD
HCT: 25.7 % — ABNORMAL LOW (ref 36.0–46.0)
HCT: 23.1 % — ABNORMAL LOW (ref 36.0–46.0)
Hemoglobin: 8.7 g/dL — ABNORMAL LOW (ref 12.0–15.0)
Hemoglobin: 7.9 g/dL — ABNORMAL LOW (ref 12.0–15.0)

## 2023-04-23 LAB — PREPARE RBC (CROSSMATCH)

## 2023-04-23 LAB — CULTURE, BLOOD (ROUTINE X 2)

## 2023-04-23 LAB — MAGNESIUM: Magnesium: 2 mg/dL (ref 1.7–2.4)

## 2023-04-23 SURGERY — ESOPHAGOGASTRODUODENOSCOPY (EGD) WITH PROPOFOL
Anesthesia: Monitor Anesthesia Care

## 2023-04-23 MED ORDER — PROPOFOL 1000 MG/100ML IV EMUL
5.0000 ug/kg/min | INTRAVENOUS | Status: DC
  Administered 2023-04-23: 5 ug/kg/min via INTRAVENOUS
  Administered 2023-04-23 – 2023-04-24 (×2): 15 ug/kg/min via INTRAVENOUS
  Filled 2023-04-23 (×4): qty 100

## 2023-04-23 MED ORDER — FENTANYL CITRATE (PF) 100 MCG/2ML IJ SOLN
INTRAMUSCULAR | Status: AC
  Filled 2023-04-23: qty 2

## 2023-04-23 MED ORDER — SODIUM CHLORIDE 0.9% IV SOLUTION: Freq: Once | INTRAVENOUS | Status: AC

## 2023-04-23 MED ORDER — POTASSIUM CHLORIDE 10 MEQ/100ML IV SOLN
10.0000 meq | INTRAVENOUS | Status: AC
  Administered 2023-04-23 (×2): 10 meq via INTRAVENOUS
  Filled 2023-04-23 (×2): qty 100

## 2023-04-23 MED ORDER — ORAL CARE MOUTH RINSE
15.0000 mL | OROMUCOSAL | Status: DC
  Administered 2023-04-23 – 2023-04-25 (×16): 15 mL via OROMUCOSAL

## 2023-04-23 MED ORDER — PHENYLEPHRINE 80 MCG/ML (10ML) SYRINGE FOR IV PUSH (FOR BLOOD PRESSURE SUPPORT)
PREFILLED_SYRINGE | INTRAVENOUS | Status: AC
  Filled 2023-04-23: qty 10

## 2023-04-23 MED ORDER — PROPOFOL 1000 MG/100ML IV EMUL
INTRAVENOUS | Status: AC
  Filled 2023-04-23: qty 100

## 2023-04-23 MED ORDER — METRONIDAZOLE 500 MG/100ML IV SOLN
500.0000 mg | Freq: Two times a day (BID) | INTRAVENOUS | Status: DC
  Administered 2023-04-23 – 2023-04-24 (×3): 500 mg via INTRAVENOUS
  Filled 2023-04-23 (×3): qty 100

## 2023-04-23 MED ORDER — FENTANYL 2500MCG IN NS 250ML (10MCG/ML) PREMIX INFUSION
0.0000 ug/h | INTRAVENOUS | Status: DC
  Administered 2023-04-23: 25 ug/h via INTRAVENOUS
  Filled 2023-04-23: qty 250

## 2023-04-23 MED ORDER — ORAL CARE MOUTH RINSE: 15.0000 mL | OROMUCOSAL | Status: DC | PRN

## 2023-04-23 MED ORDER — CALCIUM GLUCONATE-NACL 2-0.675 GM/100ML-% IV SOLN
2.0000 g | Freq: Once | INTRAVENOUS | Status: AC
  Administered 2023-04-23: 2000 mg via INTRAVENOUS
  Filled 2023-04-23: qty 100

## 2023-04-23 MED ORDER — ETOMIDATE 2 MG/ML IV SOLN
INTRAVENOUS | Status: AC
  Administered 2023-04-23: 20 mg
  Filled 2023-04-23: qty 20

## 2023-04-23 MED ORDER — SODIUM CHLORIDE 0.9 % IV SOLN
2.0000 g | Freq: Two times a day (BID) | INTRAVENOUS | Status: DC
  Administered 2023-04-23 – 2023-04-24 (×4): 2 g via INTRAVENOUS
  Filled 2023-04-23 (×4): qty 12.5

## 2023-04-23 MED ORDER — IOHEXOL 300 MG/ML  SOLN
100.0000 mL | Freq: Once | INTRAMUSCULAR | Status: AC | PRN
  Administered 2023-04-23: 30 mL via INTRA_ARTERIAL

## 2023-04-23 MED ORDER — NOREPINEPHRINE 16 MG/250ML-% IV SOLN
0.0000 ug/min | INTRAVENOUS | Status: DC
  Administered 2023-04-23: 14 ug/min via INTRAVENOUS
  Administered 2023-04-24: 15 ug/min via INTRAVENOUS
  Filled 2023-04-23 (×2): qty 250

## 2023-04-23 MED ORDER — KETAMINE HCL 50 MG/5ML IJ SOSY
PREFILLED_SYRINGE | INTRAMUSCULAR | Status: AC
  Filled 2023-04-23: qty 10

## 2023-04-23 MED ORDER — IOHEXOL 300 MG/ML  SOLN
100.0000 mL | Freq: Once | INTRAMUSCULAR | Status: AC | PRN
  Administered 2023-04-23: 10 mL via INTRAVENOUS

## 2023-04-23 MED ORDER — LIDOCAINE HCL (PF) 1 % IJ SOLN
INTRAMUSCULAR | Status: AC
  Filled 2023-04-23: qty 30

## 2023-04-23 MED ORDER — ROCURONIUM BROMIDE 10 MG/ML (PF) SYRINGE
PREFILLED_SYRINGE | INTRAVENOUS | Status: AC
  Filled 2023-04-23: qty 10

## 2023-04-23 MED ORDER — MIDAZOLAM HCL 2 MG/2ML IJ SOLN
INTRAMUSCULAR | Status: AC
  Administered 2023-04-23: 1 mg
  Filled 2023-04-23: qty 2

## 2023-04-23 MED ORDER — IOHEXOL 300 MG/ML  SOLN
100.0000 mL | Freq: Once | INTRAMUSCULAR | Status: AC | PRN
  Administered 2023-04-23: 20 mL via INTRA_ARTERIAL

## 2023-04-23 MED ORDER — SUCCINYLCHOLINE CHLORIDE 200 MG/10ML IV SOSY
PREFILLED_SYRINGE | INTRAVENOUS | Status: AC
  Filled 2023-04-23: qty 10

## 2023-04-23 MED ORDER — FENTANYL CITRATE PF 50 MCG/ML IJ SOSY
PREFILLED_SYRINGE | INTRAMUSCULAR | Status: AC
  Administered 2023-04-23: 50 ug
  Filled 2023-04-23: qty 2

## 2023-04-23 NOTE — Sedation Documentation (Addendum)
Dr Mir -Consent obtained from Nicole Peters

## 2023-04-23 NOTE — Progress Notes (Addendum)
Cross covering ICU physician.   Elink having difficulty reaching GI. IR prefers GI to scope prior to intervention for embolization. I personally contacted Dr Mir with IR to relay GI not responding and imaging revealing active extravasation from gda.   Dr Mir will look at imaging as we will await GI to contact us back. Concern that with her atherosclerotic disease that emobolization may not be possible. \  He will contact me back with decision as we await GI.    Update 0147:  Called to intubate prior to pt transport to IR for embolization.  Updated husband via phone who provided consent for intubation.
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
## 2023-04-23 NOTE — Progress Notes (Signed)
Pharmacy Antibiotic Note  Nicole Peters is a 72 y.o. female admitted on 04/21/2023 with sepsis, acute blood loss anemia, and AKI. Pt with GIB, now s/p embolization. Pharmacy has been consulted for cefepime dosing.  Today, 04/23/23 SCr improved to 1.48. Baseline unknown - SCr 1.24 in 2019 WBC significantly elevated Still requiring pressors, now intubated  De-escalating antibiotics > vancomycin discontinued.   Plan: Increase cefepime to 2 g IV q12h Decrease metronidazole to 500 mg IV q12h (c.diff negative) Monitor renal function, culture data.   Height: 5\' 4"  (162.6 cm) Weight: 98.8 kg (217 lb 13 oz) IBW/kg (Calculated) : 54.7  Temp (24hrs), Avg:98.2 F (36.8 C), Min:97.5 F (36.4 C), Max:99.4 F (37.4 C)  Recent Labs  Lab 04/21/23 1845 04/21/23 2149 04/22/23 0154 04/22/23 0535 04/22/23 1631 04/23/23 0425 04/23/23 0753  WBC 55.6*  --   --  48.9* 43.6* 41.6*  --   CREATININE 3.05*  --   --  2.95*  --   --  1.48*  LATICACIDVEN 8.8* 8.9* 8.5* 3.1*  --   --   --      Estimated Creatinine Clearance: 39.2 mL/min (A) (by C-G formula based on SCr of 1.48 mg/dL (H)).    No Known Allergies  Antimicrobials this admission: 5/29 cefepime >>  5/29 vancomycin >> 5/31 5/29 metronidazole >>  Dose adjustments this admission: 5/31: Cefepime 2 g q24h > 2 g q12h.   Microbiology results: 5/29 BCx: ngtd 5/29 UCx: ngF 5/30 C.diff: Negative 5/30 GI panel: not detected  Cindi Carbon, PharmD, BCPS 04/23/2023 10:57 AM

## 2023-04-23 NOTE — Consult Note (Signed)
I received a page at 12:37 AM from Dr.Osei of the critical care service regarding this patient with brisk GI bleeding. I called him under 15 minutes later having already reviewed the chart, and we had a comprehensive discussion about the clinical scenario.  Briefly, this patient was admitted 2 nights ago with altered mental status, marked anemia and concern for possible sepsis with marked leukocytosis. In the last 24 hours she has declared brisk GI bleeding with frequent passages of melena, severe anemia despite transfusion and development of hemodynamic instability requiring pressors. She is tachypneic with altered mental status and agitation. After some hesitation regarding a CT angiogram due to the patient's acute kidney injury, her worsening clinical status ultimately led her critical care for providers to feel the benefits outweigh the risks, and a CT angiogram done several hours ago showed bleeding from the duodenum (a branch of the posterior GDA).  Radiology conveyed these findings to critical care, and Dr.Osei spoke with Dr. Bryn Gulling of interventional radiology. Their initial request was for GI to be contacted immediately for upper endoscopy prior to proceeding with any IR procedures. Chart notes indicate there was some delay hearing back from the GI service, and my understanding is that the initial call went to the [facility] because they are listed on-call for unassigned [facility] patient's.  The [facility] physician presumable noted this patient had been seen by our practice as an outpatient several years ago (outpatient colonoscopy with Dr. Lavon Paganini in 2019), and then the call went out to our practice. During our comprehensive discussion, I recommended this patient be intubated for airway protection prior to any endoscopic intervention given her hemodynamic instability.  While speaking with Dr Cloyd Stagers, I received a call from Dr. Bryn Gulling informing me they had already mobilized their team to bring this  patient to the IR suite for angiographic control of the bleeding.  He and I discussed the case, and given his description of the degree and source of brisk bleeding on the CTA, my clinical impression from chart review and speaking with these providers, and considering this patient's hemodynamic instability and need to intervene quickly, I believe the IR service is more likely to be successful at control of this bleeding than endoscopic intervention. Dr.Mir asked our practice to be available for further consultation and endoscopic intervention if necessary, which we certainly will be.  I will remain available the rest of the night before our daytime inpatient team takes over.  He also agreed with having this patient intubated before procedure.  I called back Dr.Osei, updated him on the plan and he will speak with the onsite critical care physician for intubation and further management.  (50 minutes chart review, provider communications and this documentation)   - Amada Jupiter, MD    [facility]

## 2023-04-23 NOTE — Consult Note (Signed)
Chief Complaint: Acute upper GI bleed  Referring Physician(s): Dr. Briant Sites  Patient Status: [facility] - In-pt  History of Present Illness: Nicole Peters is a 72 y.o. female with acute upper GI bleed resulting in hemodynamic instability.  CTA shows acute hemorrhage in the duodenum, likely from gastroduodenal artery.  IR and GI consulted regarding further management.    Past Medical History:  Diagnosis Date   Allergy    Anxiety    Arthritis    Depression    History of bronchitis    Hyperlipidemia    Hypertension    Obesity    Osteoporosis    Stress incontinence     Past Surgical History:  Procedure Laterality Date   APPENDECTOMY     Arthroscopic knee surgery     Both knees- Dr. Luiz Blare   CHOLECYSTECTOMY     COLONOSCOPY  12/22/2007   Diverticulosis. Robert Kaplan/[place]. Repeat in 10 years.   INJECTION KNEE Left 03/16/2016   Procedure: LEFT KNEE CORTISONE INJECTION. ;  Surgeon: Jodi Geralds, MD;  Location: [facility] OR;  Service: Orthopedics;  Laterality: Left;   KNEE SURGERY Bilateral    arthroscopic   LAPAROTOMY     lysis of scar adhesion   TONSILLECTOMY     TOTAL KNEE ARTHROPLASTY Right 03/16/2016   Procedure: RIGHT TOTAL KNEE ARTHROPLASTY ( LATERAL APPROACH) & LEFT KNEE CORTISONE INJECTION. ;  Surgeon: Jodi Geralds, MD;  Location: [facility] OR;  Service: Orthopedics;  Laterality: Right;   TOTAL KNEE ARTHROPLASTY Left 06/24/2018   Procedure: LEFT TOTAL KNEE ARTHROPLASTY;  Surgeon: Jodi Geralds, MD;  Location: [facility] ORS;  Service: Orthopedics;  Laterality: Left;   TUBAL LIGATION      Allergies: Patient has no known allergies.  Medications: Prior to Admission medications   Medication Sig Start Date End Date Taking? Authorizing Provider  levothyroxine (SYNTHROID) 50 MCG tablet Take 75 mcg by mouth every morning. 02/03/23  Yes [provider]  meloxicam (MOBIC) 15 MG tablet Take 15 mg by mouth daily. 04/08/23  Yes [provider]  montelukast (SINGULAIR) 10 MG  tablet Take 10 mg by mouth at bedtime. 02/14/23  Yes [provider]  alendronate (FOSAMAX) 70 MG tablet Take 1 tablet (70 mg total) by mouth every 7 (seven) days. Take with a full glass of water on an empty stomach. 09/22/18   Lezlie Lye, Meda Coffee, MD  ALPRAZolam Prudy Feeler) 0.25 MG tablet Take 1-1.5 tablets (0.25-0.375 mg total) by mouth at bedtime as needed for anxiety. 09/22/18   Lezlie Lye, Meda Coffee, MD  amLODipine (NORVASC) 5 MG tablet Take 5 mg by mouth daily. 02/27/23   [provider]  aspirin 81 MG chewable tablet Chew by mouth daily.    [provider]  benazepril (LOTENSIN) 20 MG tablet Take 1 tablet (20 mg total) by mouth daily. 03/14/18   Ethelda Chick, MD  cetirizine (ZYRTEC) 10 MG tablet Take 10 mg by mouth daily as needed for allergies.     [provider]  Cholecalciferol (VITAMIN D3) 2000 units capsule Take 2,000 Units by mouth daily.    [provider]  Multiple Vitamin (MULTIVITAMIN) tablet Take 1 tablet by mouth daily.    [provider]  pravastatin (PRAVACHOL) 80 MG tablet Take 1 tablet (80 mg total) by mouth at bedtime. 03/14/18   Ethelda Chick, MD  simvastatin (ZOCOR) 80 MG tablet Take 80 mg by mouth daily. 02/03/23   [provider]     Family History  Problem Relation  Age of Onset   Cancer Father        stomach cancer with liver mets   Heart disease Father        angina; no AMI/CAD;no CABG   Stomach cancer Father    Heart disease Brother 34       mild heart attack/AMI in 1997   Colon cancer Neg Hx    Colon polyps Neg Hx    Esophageal cancer Neg Hx    Rectal cancer Neg Hx     Social History   Socioeconomic History   Marital status: Married    Spouse name: Trey Paula   Number of children: 1   Years of education: Not on file   Highest education level: Not on file  Occupational History   Occupation: retired    Comment: cleaned house  Tobacco Use   Smoking status: Former    Packs/day: 0.25    Years:  40.00    Additional pack years: 0.00    Total pack years: 10.00    Types: Cigarettes    Quit date: 12/12/2016    Years since quitting: 6.3   Smokeless tobacco: Never   Tobacco comments:    quit smoking 2018  Vaping Use   Vaping Use: Never used  Substance and Sexual Activity   Alcohol use: No    Alcohol/week: 0.0 standard drinks of alcohol   Drug use: Never   Sexual activity: Yes    Birth control/protection: Post-menopausal  Other Topics Concern   Not on file  Social History Narrative   Marital status: married x 33 years; second husband ;happily; no abuse.      Children: one child/son in [place] (46) in [place]; one grandchild (12yo)      Lives: with husband.      Employment:  Homemaker/retired.  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      Tobacco: quit in 11/2016;  5 cigarettes per day.  Smoking since age 42.       Alcohol:  None      Drugs: none      Exercise: no exercise due to R oasteoarthritis knee.       Seatbelt:  100%.       Guns:  Loaded; secured.      ADLs: indepdendent with ADLs.     Advanced Directives: none; desires FULL CODE but no prolonged measures.   Social Determinants of Health   Financial Resource Strain: Not on file  Food Insecurity: Not on file  Transportation Needs: Not on file  Physical Activity: Not on file  Stress: Not on file  Social Connections: Not on file   Review of Systems: A 12 point ROS discussed and pertinent positives are indicated in the HPI above.  All other systems are negative.  Review of Systems  Vital Signs: BP 121/86   Pulse 92   Temp 98.3 F (36.8 C) (Axillary)   Resp (!) 22   Ht 5\' 4"  (1.626 m)   Wt 97 kg   SpO2 96%   BMI 36.71 kg/m   Advance Care Plan: The advanced care plan/surrogate decision maker was discussed at the time of visit and the patient did not wish to discuss or was not able to name a surrogate decision maker or provide an advance care plan.   Physical Exam  Imaging: CT ANGIO GI BLEED  Addendum  Date: 04/23/2023   ADDENDUM REPORT: 04/23/2023 00:39 ADDENDUM: These results were called by telephone at the time of interpretation on 04/22/2023 at 11:30 pm  to provider Cloyd Stagers, MD, who verbally acknowledged these results. Electronically Signed   By: Helyn Numbers M.D.   On: 04/23/2023 00:39   Result Date: 04/23/2023 CLINICAL DATA:  Gastrointestinal hemorrhage, melena EXAM: CTA ABDOMEN AND PELVIS WITHOUT AND WITH CONTRAST TECHNIQUE: Multidetector CT imaging of the abdomen and pelvis was performed using the standard protocol during bolus administration of intravenous contrast. Multiplanar reconstructed images and MIPs were obtained and reviewed to evaluate the vascular anatomy. RADIATION DOSE REDUCTION: This exam was performed according to the departmental dose-optimization program which includes automated exposure control, adjustment of the mA and/or kV according to patient size and/or use of iterative reconstruction technique. CONTRAST:  80mL OMNIPAQUE IOHEXOL 350 MG/ML SOLN COMPARISON:  05/24/2014 FINDINGS: VASCULAR Aorta: Extensive atherosclerotic calcification. No aneurysm or dissection. No periaortic inflammatory change or hematoma. Celiac: Less than 50% stenosis of the origin. Distally patent without aneurysm or dissection. Active extravasation noted from the mid gastroduodenal artery best seen on image # 66, series # 5. SMA: Less than 50% stenosis of the origin.  Distally patent. Renals: Both renal arteries are patent without evidence of aneurysm, dissection, vasculitis, fibromuscular dysplasia or significant stenosis. IMA: Patent without evidence of aneurysm, dissection, vasculitis or significant stenosis. Inflow: Patent without evidence of aneurysm, dissection, vasculitis or significant stenosis. Proximal Outflow: Bilateral common femoral and visualized portions of the superficial and profunda femoral arteries are patent without evidence of aneurysm, dissection, vasculitis or significant stenosis. Veins:  Unremarkable. Review of the MIP images confirms the above findings. NON-VASCULAR Lower chest: Visualized lung bases are clear. Moderate right coronary artery calcification. Global cardiac size within normal limits. Small hiatal hernia. Hepatobiliary: Choose 2 Pancreas: Unremarkable Spleen: Unremarkable Adrenals/Urinary Tract: The adrenal glands are unremarkable. Vascular calcification within the left renal hilum. The kidneys are otherwise unremarkable. Foley catheter balloon seen within a decompressed bladder lumen. Stomach/Bowel: There is large volume active hemorrhage involving the second portion of the duodenum with, as noted above, active extravasation arising from the mid gastroduodenal artery. Severe sigmoid diverticulosis without superimposed acute inflammatory change. The stomach, small bowel, and large bowel are otherwise unremarkable. No free intraperitoneal gas or fluid. Lymphatic: No pathologic adenopathy within the abdomen and pelvis. Reproductive: Uterus and bilateral adnexa are unremarkable. Other: No abdominal wall hernia. Musculoskeletal: Degenerative changes are seen within the a visualized thoracolumbar spine with grade 1 anterolisthesis L4-5. No acute bone abnormality. No lytic or blastic bone lesion. IMPRESSION: 1. Large volume active hemorrhage involving the second portion of the duodenum with active extravasation arising from the mid gastroduodenal artery. 2. Moderate right coronary artery calcification. 3. Severe sigmoid diverticulosis without superimposed acute inflammatory change. Aortic Atherosclerosis (ICD10-I70.0). Electronically Signed: By: Helyn Numbers M.D. On: 04/22/2023 23:02   [place] EKG SITE RITE  Result Date: 04/22/2023 If Site Rite image not attached, placement could not be confirmed due to current cardiac rhythm.  US RENAL  Result Date: 04/22/2023 CLINICAL DATA:  AKI EXAM: RENAL / URINARY TRACT ULTRASOUND COMPLETE COMPARISON:  CT abdomen and pelvis with and without  contrast July second 2015 FINDINGS: Right Kidney: Renal measurements: 9.4 x 3.9 x 4.2 cm = volume: 80.8 mL. Echogenicity within normal limits. No mass or hydronephrosis visualized. Left Kidney: Renal measurements: 8.9 x 4.4 x 5.8 cm = volume: 117.9 mL. Echogenicity within normal limits. No mass or hydronephrosis visualized. Bladder: Decompressed with Foley catheter in place. Other: None. IMPRESSION: No acute findings. Electronically Signed   By: Jacob Moores M.D.   On: 04/22/2023 11:56   DG Knee Complete 4  Views Left  Result Date: 04/21/2023 CLINICAL DATA:  Fall EXAM: LEFT KNEE - COMPLETE 4+ VIEW COMPARISON:  None Available. FINDINGS: Knee replacement. No fracture or malalignment. No significant effusion IMPRESSION: Knee replacement. No acute osseous abnormality. Electronically Signed   By: Jasmine Pang M.D.   On: 04/21/2023 20:14   DG Knee Complete 4 Views Right  Result Date: 04/21/2023 CLINICAL DATA:  Fall EXAM: RIGHT KNEE - COMPLETE 4+ VIEW COMPARISON:  None Available. FINDINGS: No fracture or malalignment. No significant effusion. Knee replacement with intact hardware. Vascular calcifications. IMPRESSION: Knee replacement. No acute osseous abnormality. Electronically Signed   By: Jasmine Pang M.D.   On: 04/21/2023 20:13   DG Pelvis Portable  Result Date: 04/21/2023 CLINICAL DATA:  Fall EXAM: PORTABLE PELVIS 1-2 VIEWS COMPARISON:  None Available. FINDINGS: There is no evidence of pelvic fracture or diastasis. No pelvic bone lesions are seen. Possible old right inferior pubic ramus 4 milli. IMPRESSION: Negative. Electronically Signed   By: Jasmine Pang M.D.   On: 04/21/2023 20:12   DG Chest Port 1 View  Result Date: 04/21/2023 CLINICAL DATA:  Fall EXAM: PORTABLE CHEST 1 VIEW COMPARISON:  06/17/2018 FINDINGS: The heart size and mediastinal contours are within normal limits. Aortic atherosclerosis. Both lungs are clear. The visualized skeletal structures are unremarkable. IMPRESSION: No active  disease. Electronically Signed   By: Jasmine Pang M.D.   On: 04/21/2023 20:11   CT Head Wo Contrast  Result Date: 04/21/2023 CLINICAL DATA:  Fall several days ago with facial pain and headaches, initial encounter EXAM: CT HEAD WITHOUT CONTRAST CT MAXILLOFACIAL WITHOUT CONTRAST CT CERVICAL SPINE WITHOUT CONTRAST TECHNIQUE: Multidetector CT imaging of the head, cervical spine, and maxillofacial structures were performed using the standard protocol without intravenous contrast. Multiplanar CT image reconstructions of the cervical spine and maxillofacial structures were also generated. RADIATION DOSE REDUCTION: This exam was performed according to the departmental dose-optimization program which includes automated exposure control, adjustment of the mA and/or kV according to patient size and/or use of iterative reconstruction technique. COMPARISON:  None Available. FINDINGS: CT HEAD FINDINGS Brain: No evidence of acute infarction, hemorrhage, hydrocephalus, extra-axial collection or mass lesion/mass effect. Vascular: No hyperdense vessel or unexpected calcification. Skull: Normal. Negative for fracture or focal lesion. Other: None. CT MAXILLOFACIAL FINDINGS Osseous: No fracture or mandibular dislocation. No destructive process. Orbits: Orbits and their contents are within normal limits. Sinuses: Paranasal sinuses are unremarkable. Ostiomeatal complexes are patent. Soft tissues: Surrounding soft tissue structures show no focal abnormality. CT CERVICAL SPINE FINDINGS Alignment: Within normal limits. Skull base and vertebrae: 7 cervical segments are well visualized. Vertebral body height is well maintained. Multilevel disc space narrowing is noted from C3-C7 with osteophytic changes. Facet hypertrophic changes are noted as well. Neural foraminal narrowing is noted at multiple levels. No acute fracture or acute facet abnormality is noted. The odontoid is within normal limits. Soft tissues and spinal canal: Surrounding  soft tissue structures are within normal limits. Upper chest: Visualized lung apices show emphysematous change. Other: None IMPRESSION: CT of the head: No acute intracranial abnormality noted. CT of the maxillofacial bones: No acute bony abnormality is noted. CT of the cervical spine: Multilevel degenerative change without acute abnormality. Electronically Signed   By: Alcide Clever M.D.   On: 04/21/2023 20:00   CT Maxillofacial WO CM  Result Date: 04/21/2023 CLINICAL DATA:  Fall several days ago with facial pain and headaches, initial encounter EXAM: CT HEAD WITHOUT CONTRAST CT MAXILLOFACIAL WITHOUT CONTRAST CT CERVICAL SPINE WITHOUT  CONTRAST TECHNIQUE: Multidetector CT imaging of the head, cervical spine, and maxillofacial structures were performed using the standard protocol without intravenous contrast. Multiplanar CT image reconstructions of the cervical spine and maxillofacial structures were also generated. RADIATION DOSE REDUCTION: This exam was performed according to the departmental dose-optimization program which includes automated exposure control, adjustment of the mA and/or kV according to patient size and/or use of iterative reconstruction technique. COMPARISON:  None Available. FINDINGS: CT HEAD FINDINGS Brain: No evidence of acute infarction, hemorrhage, hydrocephalus, extra-axial collection or mass lesion/mass effect. Vascular: No hyperdense vessel or unexpected calcification. Skull: Normal. Negative for fracture or focal lesion. Other: None. CT MAXILLOFACIAL FINDINGS Osseous: No fracture or mandibular dislocation. No destructive process. Orbits: Orbits and their contents are within normal limits. Sinuses: Paranasal sinuses are unremarkable. Ostiomeatal complexes are patent. Soft tissues: Surrounding soft tissue structures show no focal abnormality. CT CERVICAL SPINE FINDINGS Alignment: Within normal limits. Skull base and vertebrae: 7 cervical segments are well visualized. Vertebral body height  is well maintained. Multilevel disc space narrowing is noted from C3-C7 with osteophytic changes. Facet hypertrophic changes are noted as well. Neural foraminal narrowing is noted at multiple levels. No acute fracture or acute facet abnormality is noted. The odontoid is within normal limits. Soft tissues and spinal canal: Surrounding soft tissue structures are within normal limits. Upper chest: Visualized lung apices show emphysematous change. Other: None IMPRESSION: CT of the head: No acute intracranial abnormality noted. CT of the maxillofacial bones: No acute bony abnormality is noted. CT of the cervical spine: Multilevel degenerative change without acute abnormality. Electronically Signed   By: Alcide Clever M.D.   On: 04/21/2023 20:00   CT Cervical Spine Wo Contrast  Result Date: 04/21/2023 CLINICAL DATA:  Fall several days ago with facial pain and headaches, initial encounter EXAM: CT HEAD WITHOUT CONTRAST CT MAXILLOFACIAL WITHOUT CONTRAST CT CERVICAL SPINE WITHOUT CONTRAST TECHNIQUE: Multidetector CT imaging of the head, cervical spine, and maxillofacial structures were performed using the standard protocol without intravenous contrast. Multiplanar CT image reconstructions of the cervical spine and maxillofacial structures were also generated. RADIATION DOSE REDUCTION: This exam was performed according to the departmental dose-optimization program which includes automated exposure control, adjustment of the mA and/or kV according to patient size and/or use of iterative reconstruction technique. COMPARISON:  None Available. FINDINGS: CT HEAD FINDINGS Brain: No evidence of acute infarction, hemorrhage, hydrocephalus, extra-axial collection or mass lesion/mass effect. Vascular: No hyperdense vessel or unexpected calcification. Skull: Normal. Negative for fracture or focal lesion. Other: None. CT MAXILLOFACIAL FINDINGS Osseous: No fracture or mandibular dislocation. No destructive process. Orbits: Orbits and  their contents are within normal limits. Sinuses: Paranasal sinuses are unremarkable. Ostiomeatal complexes are patent. Soft tissues: Surrounding soft tissue structures show no focal abnormality. CT CERVICAL SPINE FINDINGS Alignment: Within normal limits. Skull base and vertebrae: 7 cervical segments are well visualized. Vertebral body height is well maintained. Multilevel disc space narrowing is noted from C3-C7 with osteophytic changes. Facet hypertrophic changes are noted as well. Neural foraminal narrowing is noted at multiple levels. No acute fracture or acute facet abnormality is noted. The odontoid is within normal limits. Soft tissues and spinal canal: Surrounding soft tissue structures are within normal limits. Upper chest: Visualized lung apices show emphysematous change. Other: None IMPRESSION: CT of the head: No acute intracranial abnormality noted. CT of the maxillofacial bones: No acute bony abnormality is noted. CT of the cervical spine: Multilevel degenerative change without acute abnormality. Electronically Signed   By: Alcide Clever  M.D.   On: 04/21/2023 20:00    Labs:  CBC: Recent Labs    04/21/23 1845 04/22/23 0535 04/22/23 1203 04/22/23 1631  WBC 55.6* 48.9*  --  43.6*  HGB 4.4* 6.7* 7.8* 6.2*  HCT 14.0* 18.5* 23.5* 18.2*  PLT 243 168  --  160    COAGS: Recent Labs    04/21/23 1845  INR 1.3*  APTT 24    BMP: Recent Labs    04/21/23 1845 04/22/23 0001 04/22/23 0535 04/22/23 0915 04/22/23 1202 04/22/23 1631 04/23/23 0120  NA 128*  --  131*  --   --   --   --   K 5.2*   < > 5.5* 4.7 4.4 4.2 3.5  CL 97*  --  104  --   --   --   --   CO2 15*  --  18*  --   --   --   --   GLUCOSE 164*  --  128*  --   --   --   --   BUN 115*  --  115*  --   --   --   --   CALCIUM 7.6*  --  6.8*  --   --   --   --   CREATININE 3.05*  --  2.95*  --   --   --   --   GFRNONAA 16*  --  16*  --   --   --   --    < > = values in this interval not displayed.    LIVER FUNCTION  TESTS: Recent Labs    04/21/23 1845 04/22/23 1631  BILITOT 0.7 1.8*  AST 40  --   ALT 21  --   ALKPHOS 26*  --   PROT 4.7*  --   ALBUMIN 2.5*  --     TUMOR MARKERS: No results for input(s): "AFPTM", "CEA", "CA199", "CHROMGRNA" in the last 8760 hours.  Assessment and Plan:  72 year old woman with acute upper GI bleed with active extravasation seen in the GDA territory on CTA.  IR and GI consulted regarding further management.  I discussed the case with Dr. Myrtie Neither from the GI service, and we mutually agreed that given the rapid hemorrhage seen in the duodenum, the patient would be best treated with catheter directed embolization of the GDA.   Due to the patient's altered mental status, I obtained consent from the patient's husband Ivone Papageorgiou.  Risks and benefits of mesenteric angiogram and embolization were discussed with the patient including, but not limited to bleeding, infection, vascular injury or contrast induced renal failure.  All of the patient's husband's questions were answered, patient is agreeable to proceed.  Consent signed and in chart.  Thank you for this interesting consult.  I greatly enjoyed meeting Nicole Peters and look forward to participating in their care.  A copy of this report was sent to the requesting provider on this date.  Electronically Signed: Al Corpus Sumiye Hirth, MD 04/23/2023, 2:18 AM   I spent a total of 40 Minutes in face to face in clinical consultation, greater than 50% of which was counseling/coordinating care for acute upper GI bleed.

## 2023-04-23 NOTE — Progress Notes (Signed)
eLink Physician-Brief Progress Note Patient Name: Nicole Peters DOB: February 19, 1951 MRN: [id number]   Date of Service  04/23/2023  HPI/Events of Note  Patient returned to ICU post GDA embolization.  Remains intubated.  New onset A-fib with controlled rate.  Still on norepinephrine but requirements decreasing.  Hemoglobin 6.9.  Fibrinogen 195.  eICU Interventions  Order fentanyl and propofol for sedation. Transfuse red cells and cryoprecipitate.  Since she is not in RVR, will monitor A-fib.  Not a candidate for anticoagulation on account of GI bleed.     Intervention Category Major Interventions: Hemorrhage - evaluation and management;Shock - evaluation and management  Carilyn Goodpasture 04/23/2023, 5:25 AM

## 2023-04-23 NOTE — Progress Notes (Signed)
Patient ID: Nicole Peters, female   DOB: January 06, 1951, 72 y.o.   MRN: [id number]    Progress Note   Subjective   Day # 2  CC; admitted 04/21/2023 after fall at home 3 days previous, then progressive functional decline with lightheadedness, confusion, poor intake, and found to have probable sepsis with severe leukocytosis and lactic acidosis, acute kidney injury, hyperkalemia and acute blood loss anemia with hemoglobin 4.4  Initially transfused 2 units of packed RBCs, 1 further unit yesterday morning Required pressor yesterday. Patient developed large-volume melena last evening, given 1 unit of additional packed RBCs for hemoglobin 6.2 Stat CTA last p.m. positive for active hemorrhage involving the second portion of the duodenum mid gastroduodenal artery ,no other acute findings in the abdomen.  Patient was intubated, and underwent celiac angiogram and embolization of the gastroduodenal artery.  3 units of packed RBCs early am , 1 unit of cryoprecipitate, Kcentra and DDAVP given- getting second unit of cryo now  Last hemoglobin 6.9 at 4:30 AM, receiving blood products since WBC 41.6, platelets 104 Pro time 17.3/INR 1.4/fibrinogen 195/D-dimer 2.23 C. difficile quick screen negative, GI path panel pending Blood cultures no growth x 2 days Urine culture negative BUN 65 improved/creatinine 1.48 improved T. bili 1.2/alk phos 25/AST 241/ALT 333    Objective   Vital signs in last 24 hours: Temp:  [97.6 F (36.4 C)-99.4 F (37.4 C)] 97.6 F (36.4 C) (05/31 0904) Pulse Rate:  [45-101] 87 (05/31 0904) Resp:  [13-41] 23 (05/31 0904) BP: (65-167)/(16-113) 128/91 (05/31 0904) SpO2:  [70 %-100 %] 100 % (05/31 0904) FiO2 (%):  [50 %-100 %] 50 % (05/31 0835) Weight:  [98.8 kg] 98.8 kg (05/31 0700) Last BM Date : 04/22/23 General:   older  white female, intubated, opens her eyes to exam, nods-facial bruising Heart:  Regular rate and rhythm; no murmurs Lungs: Respirations even and unlabored,  lungs CTA bilaterally Abdomen:  Soft, nontender and nondistended.  Bowel sounds present Extremities:  Without edema, bruising on knees Neurologic:  Alert and oriented,  grossly normal neurologically. Psych: Intubated, awake, opens eyes and moving ( propofol currently off)  Intake/Output from previous day: 05/30 0701 - 05/31 0700 In: 6795.2 [I.V.:4006.1; Blood:1255; IV Piggyback:1534.1] Out: 2250 [Urine:2250] Intake/Output this shift: Total I/O In: 741.7 [I.V.:641.2; IV Piggyback:100.4] Out: 150 [Urine:150]  Lab Results: Recent Labs    04/22/23 0535 04/22/23 1203 04/22/23 1631 04/23/23 0424 04/23/23 0425  WBC 48.9*  --  43.6*  --  41.6*  HGB 6.7* 7.8* 6.2*  --  6.9*  HCT 18.5* 23.5* 18.2*  --  21.3*  PLT 168  --  160 104* 104*   BMET Recent Labs    04/21/23 1845 04/22/23 0001 04/22/23 0535 04/22/23 0915 04/22/23 1631 04/23/23 0120 04/23/23 0753  NA 128*  --  131*  --   --   --  135  K 5.2*   < > 5.5*   < > 4.2 3.5 3.9  CL 97*  --  104  --   --   --  109  CO2 15*  --  18*  --   --   --  18*  GLUCOSE 164*  --  128*  --   --   --  176*  BUN 115*  --  115*  --   --   --  65*  CREATININE 3.05*  --  2.95*  --   --   --  1.48*  CALCIUM 7.6*  --  6.8*  --   --   --  7.2*   < > = values in this interval not displayed.   LFT Recent Labs    04/22/23 1631 04/23/23 0753  PROT  --  3.7*  ALBUMIN  --  2.0*  AST  --  241*  ALT  --  333*  ALKPHOS  --  25*  BILITOT 1.8* 1.2  BILIDIR 0.5*  --   IBILI 1.3*  --    PT/INR Recent Labs    04/21/23 1845 04/23/23 0424  LABPROT 16.6* 17.3*  INR 1.3* 1.4*    Studies/Results: DG CHEST PORT 1 VIEW  Result Date: 04/23/2023 CLINICAL DATA:  72 year old female status post intubation. EXAM: PORTABLE CHEST 1 VIEW COMPARISON:  Chest x-ray 04/21/2023. FINDINGS: An endotracheal tube is in place with tip 2.9 cm above the carina. There is a right upper extremity PICC with tip terminating in the superior cavoatrial junction. Lung  volumes are low. No consolidative airspace disease. No pleural effusions. No evidence of pulmonary edema. Heart size is normal. Upper mediastinal contours are within normal limits. Atherosclerotic calcifications are noted in the thoracic aorta. IMPRESSION: 1. Support apparatus, as above. 2. Low lung volumes without radiographic evidence of acute cardiopulmonary disease. 3. Aortic atherosclerosis. Electronically Signed   By: Trudie Reed M.D.   On: 04/23/2023 07:38   CT ANGIO GI BLEED  Addendum Date: 04/23/2023   ADDENDUM REPORT: 04/23/2023 00:39 ADDENDUM: These results were called by telephone at the time of interpretation on 04/22/2023 at 11:30 pm to provider Cloyd Stagers, MD, who verbally acknowledged these results. Electronically Signed   By: Helyn Numbers M.D.   On: 04/23/2023 00:39   Result Date: 04/23/2023 CLINICAL DATA:  Gastrointestinal hemorrhage, melena EXAM: CTA ABDOMEN AND PELVIS WITHOUT AND WITH CONTRAST TECHNIQUE: Multidetector CT imaging of the abdomen and pelvis was performed using the standard protocol during bolus administration of intravenous contrast. Multiplanar reconstructed images and MIPs were obtained and reviewed to evaluate the vascular anatomy. RADIATION DOSE REDUCTION: This exam was performed according to the departmental dose-optimization program which includes automated exposure control, adjustment of the mA and/or kV according to patient size and/or use of iterative reconstruction technique. CONTRAST:  80mL OMNIPAQUE IOHEXOL 350 MG/ML SOLN COMPARISON:  05/24/2014 FINDINGS: VASCULAR Aorta: Extensive atherosclerotic calcification. No aneurysm or dissection. No periaortic inflammatory change or hematoma. Celiac: Less than 50% stenosis of the origin. Distally patent without aneurysm or dissection. Active extravasation noted from the mid gastroduodenal artery best seen on image # 66, series # 5. SMA: Less than 50% stenosis of the origin.  Distally patent. Renals: Both renal arteries are  patent without evidence of aneurysm, dissection, vasculitis, fibromuscular dysplasia or significant stenosis. IMA: Patent without evidence of aneurysm, dissection, vasculitis or significant stenosis. Inflow: Patent without evidence of aneurysm, dissection, vasculitis or significant stenosis. Proximal Outflow: Bilateral common femoral and visualized portions of the superficial and profunda femoral arteries are patent without evidence of aneurysm, dissection, vasculitis or significant stenosis. Veins: Unremarkable. Review of the MIP images confirms the above findings. NON-VASCULAR Lower chest: Visualized lung bases are clear. Moderate right coronary artery calcification. Global cardiac size within normal limits. Small hiatal hernia. Hepatobiliary: Choose 2 Pancreas: Unremarkable Spleen: Unremarkable Adrenals/Urinary Tract: The adrenal glands are unremarkable. Vascular calcification within the left renal hilum. The kidneys are otherwise unremarkable. Foley catheter balloon seen within a decompressed bladder lumen. Stomach/Bowel: There is large volume active hemorrhage involving the second portion of the duodenum with, as noted above, active extravasation arising from the mid gastroduodenal artery. Severe sigmoid diverticulosis without superimposed acute  inflammatory change. The stomach, small bowel, and large bowel are otherwise unremarkable. No free intraperitoneal gas or fluid. Lymphatic: No pathologic adenopathy within the abdomen and pelvis. Reproductive: Uterus and bilateral adnexa are unremarkable. Other: No abdominal wall hernia. Musculoskeletal: Degenerative changes are seen within the a visualized thoracolumbar spine with grade 1 anterolisthesis L4-5. No acute bone abnormality. No lytic or blastic bone lesion. IMPRESSION: 1. Large volume active hemorrhage involving the second portion of the duodenum with active extravasation arising from the mid gastroduodenal artery. 2. Moderate right coronary artery  calcification. 3. Severe sigmoid diverticulosis without superimposed acute inflammatory change. Aortic Atherosclerosis (ICD10-I70.0). Electronically Signed: By: Helyn Numbers M.D. On: 04/22/2023 23:02   [place] EKG SITE RITE  Result Date: 04/22/2023 If Site Rite image not attached, placement could not be confirmed due to current cardiac rhythm.  US RENAL  Result Date: 04/22/2023 CLINICAL DATA:  AKI EXAM: RENAL / URINARY TRACT ULTRASOUND COMPLETE COMPARISON:  CT abdomen and pelvis with and without contrast July second 2015 FINDINGS: Right Kidney: Renal measurements: 9.4 x 3.9 x 4.2 cm = volume: 80.8 mL. Echogenicity within normal limits. No mass or hydronephrosis visualized. Left Kidney: Renal measurements: 8.9 x 4.4 x 5.8 cm = volume: 117.9 mL. Echogenicity within normal limits. No mass or hydronephrosis visualized. Bladder: Decompressed with Foley catheter in place. Other: None. IMPRESSION: No acute findings. Electronically Signed   By: Jacob Moores M.D.   On: 04/22/2023 11:56   DG Knee Complete 4 Views Left  Result Date: 04/21/2023 CLINICAL DATA:  Fall EXAM: LEFT KNEE - COMPLETE 4+ VIEW COMPARISON:  None Available. FINDINGS: Knee replacement. No fracture or malalignment. No significant effusion IMPRESSION: Knee replacement. No acute osseous abnormality. Electronically Signed   By: Jasmine Pang M.D.   On: 04/21/2023 20:14   DG Knee Complete 4 Views Right  Result Date: 04/21/2023 CLINICAL DATA:  Fall EXAM: RIGHT KNEE - COMPLETE 4+ VIEW COMPARISON:  None Available. FINDINGS: No fracture or malalignment. No significant effusion. Knee replacement with intact hardware. Vascular calcifications. IMPRESSION: Knee replacement. No acute osseous abnormality. Electronically Signed   By: Jasmine Pang M.D.   On: 04/21/2023 20:13   DG Pelvis Portable  Result Date: 04/21/2023 CLINICAL DATA:  Fall EXAM: PORTABLE PELVIS 1-2 VIEWS COMPARISON:  None Available. FINDINGS: There is no evidence of pelvic fracture or  diastasis. No pelvic bone lesions are seen. Possible old right inferior pubic ramus 4 milli. IMPRESSION: Negative. Electronically Signed   By: Jasmine Pang M.D.   On: 04/21/2023 20:12   DG Chest Port 1 View  Result Date: 04/21/2023 CLINICAL DATA:  Fall EXAM: PORTABLE CHEST 1 VIEW COMPARISON:  06/17/2018 FINDINGS: The heart size and mediastinal contours are within normal limits. Aortic atherosclerosis. Both lungs are clear. The visualized skeletal structures are unremarkable. IMPRESSION: No active disease. Electronically Signed   By: Jasmine Pang M.D.   On: 04/21/2023 20:11   CT Head Wo Contrast  Result Date: 04/21/2023 CLINICAL DATA:  Fall several days ago with facial pain and headaches, initial encounter EXAM: CT HEAD WITHOUT CONTRAST CT MAXILLOFACIAL WITHOUT CONTRAST CT CERVICAL SPINE WITHOUT CONTRAST TECHNIQUE: Multidetector CT imaging of the head, cervical spine, and maxillofacial structures were performed using the standard protocol without intravenous contrast. Multiplanar CT image reconstructions of the cervical spine and maxillofacial structures were also generated. RADIATION DOSE REDUCTION: This exam was performed according to the departmental dose-optimization program which includes automated exposure control, adjustment of the mA and/or kV according to patient size and/or use of iterative  reconstruction technique. COMPARISON:  None Available. FINDINGS: CT HEAD FINDINGS Brain: No evidence of acute infarction, hemorrhage, hydrocephalus, extra-axial collection or mass lesion/mass effect. Vascular: No hyperdense vessel or unexpected calcification. Skull: Normal. Negative for fracture or focal lesion. Other: None. CT MAXILLOFACIAL FINDINGS Osseous: No fracture or mandibular dislocation. No destructive process. Orbits: Orbits and their contents are within normal limits. Sinuses: Paranasal sinuses are unremarkable. Ostiomeatal complexes are patent. Soft tissues: Surrounding soft tissue structures show  no focal abnormality. CT CERVICAL SPINE FINDINGS Alignment: Within normal limits. Skull base and vertebrae: 7 cervical segments are well visualized. Vertebral body height is well maintained. Multilevel disc space narrowing is noted from C3-C7 with osteophytic changes. Facet hypertrophic changes are noted as well. Neural foraminal narrowing is noted at multiple levels. No acute fracture or acute facet abnormality is noted. The odontoid is within normal limits. Soft tissues and spinal canal: Surrounding soft tissue structures are within normal limits. Upper chest: Visualized lung apices show emphysematous change. Other: None IMPRESSION: CT of the head: No acute intracranial abnormality noted. CT of the maxillofacial bones: No acute bony abnormality is noted. CT of the cervical spine: Multilevel degenerative change without acute abnormality. Electronically Signed   By: Alcide Clever M.D.   On: 04/21/2023 20:00   CT Maxillofacial WO CM  Result Date: 04/21/2023 CLINICAL DATA:  Fall several days ago with facial pain and headaches, initial encounter EXAM: CT HEAD WITHOUT CONTRAST CT MAXILLOFACIAL WITHOUT CONTRAST CT CERVICAL SPINE WITHOUT CONTRAST TECHNIQUE: Multidetector CT imaging of the head, cervical spine, and maxillofacial structures were performed using the standard protocol without intravenous contrast. Multiplanar CT image reconstructions of the cervical spine and maxillofacial structures were also generated. RADIATION DOSE REDUCTION: This exam was performed according to the departmental dose-optimization program which includes automated exposure control, adjustment of the mA and/or kV according to patient size and/or use of iterative reconstruction technique. COMPARISON:  None Available. FINDINGS: CT HEAD FINDINGS Brain: No evidence of acute infarction, hemorrhage, hydrocephalus, extra-axial collection or mass lesion/mass effect. Vascular: No hyperdense vessel or unexpected calcification. Skull: Normal.  Negative for fracture or focal lesion. Other: None. CT MAXILLOFACIAL FINDINGS Osseous: No fracture or mandibular dislocation. No destructive process. Orbits: Orbits and their contents are within normal limits. Sinuses: Paranasal sinuses are unremarkable. Ostiomeatal complexes are patent. Soft tissues: Surrounding soft tissue structures show no focal abnormality. CT CERVICAL SPINE FINDINGS Alignment: Within normal limits. Skull base and vertebrae: 7 cervical segments are well visualized. Vertebral body height is well maintained. Multilevel disc space narrowing is noted from C3-C7 with osteophytic changes. Facet hypertrophic changes are noted as well. Neural foraminal narrowing is noted at multiple levels. No acute fracture or acute facet abnormality is noted. The odontoid is within normal limits. Soft tissues and spinal canal: Surrounding soft tissue structures are within normal limits. Upper chest: Visualized lung apices show emphysematous change. Other: None IMPRESSION: CT of the head: No acute intracranial abnormality noted. CT of the maxillofacial bones: No acute bony abnormality is noted. CT of the cervical spine: Multilevel degenerative change without acute abnormality. Electronically Signed   By: Alcide Clever M.D.   On: 04/21/2023 20:00   CT Cervical Spine Wo Contrast  Result Date: 04/21/2023 CLINICAL DATA:  Fall several days ago with facial pain and headaches, initial encounter EXAM: CT HEAD WITHOUT CONTRAST CT MAXILLOFACIAL WITHOUT CONTRAST CT CERVICAL SPINE WITHOUT CONTRAST TECHNIQUE: Multidetector CT imaging of the head, cervical spine, and maxillofacial structures were performed using the standard protocol without intravenous contrast. Multiplanar CT image  reconstructions of the cervical spine and maxillofacial structures were also generated. RADIATION DOSE REDUCTION: This exam was performed according to the departmental dose-optimization program which includes automated exposure control, adjustment  of the mA and/or kV according to patient size and/or use of iterative reconstruction technique. COMPARISON:  None Available. FINDINGS: CT HEAD FINDINGS Brain: No evidence of acute infarction, hemorrhage, hydrocephalus, extra-axial collection or mass lesion/mass effect. Vascular: No hyperdense vessel or unexpected calcification. Skull: Normal. Negative for fracture or focal lesion. Other: None. CT MAXILLOFACIAL FINDINGS Osseous: No fracture or mandibular dislocation. No destructive process. Orbits: Orbits and their contents are within normal limits. Sinuses: Paranasal sinuses are unremarkable. Ostiomeatal complexes are patent. Soft tissues: Surrounding soft tissue structures show no focal abnormality. CT CERVICAL SPINE FINDINGS Alignment: Within normal limits. Skull base and vertebrae: 7 cervical segments are well visualized. Vertebral body height is well maintained. Multilevel disc space narrowing is noted from C3-C7 with osteophytic changes. Facet hypertrophic changes are noted as well. Neural foraminal narrowing is noted at multiple levels. No acute fracture or acute facet abnormality is noted. The odontoid is within normal limits. Soft tissues and spinal canal: Surrounding soft tissue structures are within normal limits. Upper chest: Visualized lung apices show emphysematous change. Other: None IMPRESSION: CT of the head: No acute intracranial abnormality noted. CT of the maxillofacial bones: No acute bony abnormality is noted. CT of the cervical spine: Multilevel degenerative change without acute abnormality. Electronically Signed   By: Alcide Clever M.D.   On: 04/21/2023 20:00       Assessment / Plan:    #72 72 year old white female admitted 5/29 with altered mental status, had fallen at home 3 days prior, per notes had not been eating well for several days, confusion.  No clear history of melena prior to admission but did have some diarrhea/melena on the day of admit. Profound anemia noted on admission,  and severe leukocytosis, lactic acidosis and acute kidney injury Initial concerns for sepsis plus minus GI bleeding.  Thus far blood cultures are negative, urine culture negative, chest x-ray negative GI path panel pending  Persistent severe leukocytosis-not clear this is related to sepsis  Patient had major active GI bleed last night with bloody/melenic stools and required increasing pressors.  Had been transfused 2 units packed RBCs on the day of admission.  Last night required 3 more units of packed RBCs, has also been given DDAVP and Kcentra and is receiving second unit of cryo Hemoglobin 6.9 but has received blood products since  CTA positive for active hemorrhage from a branch of the gastroduodenal artery and underwent celiac angio and embolization of the gastroduodenal artery early in the morning hours.   Has had a couple of episodes of dark melenic appearing stool since, vitals have been stable, and have weaned pressor a bit.  Etiology of the duodenal bleed not clear question Dieulafoy versus other ulcer, versus neoplasm.  #2 acute kidney injury improving #3 transaminitis likely secondary to shock from her normal on admit #4 A-fib/rate controlled onset yesterday  Plan; continue IV PPI twice daily Hemoglobin pending later this morning, transfuse to keep hemoglobin closer to 8 Serial DIC screens have been ordered  Will discuss with Dr. Tomasa Rand, may be most appropriate to proceed with EGD this afternoon while patient is intubated, and to clarify etiology of bleed, assess for any further oozing.  Await GI path panel Agree she will likely need otology evaluation once she is stable   Principal Problem:   Acute blood loss anemia  Active Problems:   Encephalopathy acute   Anemia   Leukocytosis   Syncope   Hypotension due to hypovolemia     LOS: 2 days   Briellah Baik PA-C 04/23/2023, 9:25 AM

## 2023-04-23 NOTE — Procedures (Signed)
Interventional Radiology Procedure Note  Procedure: Celiac angiogram and GDA embolization  Indication: Acute upper GI bleed  Findings: Please refer to procedural dictation for full description.  Complications: None  EBL: < 10 mL  Acquanetta Belling, MD [phone]

## 2023-04-23 NOTE — Progress Notes (Signed)
Patient ID: Nicole Peters, female   DOB: July 31, 1951, 72 y.o.   MRN: [id number] Pt s/p GDA embo last night for acute GI bleed; awake,FC, remains intubated; has had passage of some old blood in interim per nursing; no BRB; latest hgb 8.7, creat 1.48(2.95). access site rt CFA soft,clean, dry, no hematoma, intact distal pulses; hydrate; monitor labs; additional plans per CCM/GI

## 2023-04-23 NOTE — Progress Notes (Signed)
NAME:  Nicole Peters, MRN:  [id number], DOB:  1951-09-01, LOS: 2 ADMISSION DATE:  04/21/2023, CONSULTATION DATE:  5/29 REFERRING MD:  Dr. Wilkie Aye, CHIEF COMPLAINT: Syncope  History of Present Illness:  72 year old female with past medical history as below, which is significant for HTN, HLD and obesity.  She presented to [facility] emergency department on 5/29 who presented with altered mental status following a mechanical fall 3 days prior.  She reportedly fell forward striking her face with no reports of syncope or altered level of consciousness.  She declined evaluation at that time.  Since her fall she has developed extensive facial bruising as well as functional decline and decreased appetite.  EMS called 529 due to worsening confusion and positional lightheadedness.  Upon arrival to the emergency department she was alert and oriented.  Imaging workup including CT of the head, face, neck, and x-rays of the chest, pelvis, and both knees were negative.  Laboratory evaluation significant for sodium 128, potassium 5.2, CO2 15, creatinine 3.05, BUN 115, lactic acid 8.8, WBC 55.6, hemoglobin 4.4. PCCM asked to admit to ICU in the setting of renal failure, acidosis, and symptomatic anemia.   Husband reports in the last week she has had reduced appetite and minimal fluid intake. Had episode of dark tarry stool four days ago but no further episodes per husband or patient. No known history of GI bleed. She denies and chest pain, shortness of breath, abdominal pain, nausea. Feeling tired.   Pertinent  Medical History   has a past medical history of Allergy, Anxiety, Arthritis, Depression, History of bronchitis, Hyperlipidemia, Hypertension, Obesity, Osteoporosis, and Stress incontinence.   Significant Hospital Events: Including procedures, antibiotic start and stop dates in addition to other pertinent events   5/29 admitted after AMS/ syncope, hgb 4.4 5/30 started on low dose pressors, more confused,  getting add'l PRBC. Sent for CTA GI after a lot of pushback, after the pt went from foul smears during the day to brisk melena abruptly.  5/31 GDA embo. Intubated for this and remains so after. Ongoing product resucs.    Interim History / Subjective:   Events of overnight reviewed. Unfortunate delay in performing STAT CTA GI in setting of AKI despite explicit documentation from ordering team regarding benefits>risk   Ultimately went for GDA embo.  Continues on pressors and receiving product -- looks like 3 prbc and 1 cryo, Kcentra and ddavp overnight   Went into afib, rate controlled   Aki is improving   Objective   Blood pressure (!) 128/91, pulse 87, temperature 97.6 F (36.4 C), temperature source Axillary, resp. rate (!) 23, height 5\' 4"  (1.626 m), weight 98.8 kg, SpO2 100 %.    Vent Mode: PRVC FiO2 (%):  [50 %-100 %] 50 % Set Rate:  [20 bmp] 20 bmp Vt Set:  [430 mL] 430 mL PEEP:  [5 cmH20] 5 cmH20 Plateau Pressure:  [12 cmH20-18 cmH20] 12 cmH20   Intake/Output Summary (Last 24 hours) at 04/23/2023 9629 Last data filed at 04/23/2023 0900 Gross per 24 hour  Intake 7506.82 ml  Output 2400 ml  Net 5106.82 ml   Filed Weights   04/21/23 2137 04/22/23 0104 04/23/23 0700  Weight: 90.7 kg 97 kg 98.8 kg    Physical Exam: General: Critically ill elderly F intubated sedated  Neuro: sedated  HENT: ETT secure anicteric sclera  Respiratory: Mechanically ventilated. Some coarse sounds throughout  Cardiovascular: rrr  GI: soft round  Extremities: Extremity edema  Skin: Facial  bruising. Knee bruising    Resolved Hospital Problem list   N/A  Assessment & Plan:   Acute encephalopathy Syncope, frequent falls  -has been having falls at home for a few weeks, no preceding lightheadedness reportedly, and has occurred what sounds like randomly (I.E standing in front of the fridge) . Would think with hx anemia / dehydration as etiology but not sure this fits if she really is just  falling out randomly, without preceding sx  -AMS I think is dehydration, uremia, possible sepsis. Was actually improving steadily 5/30 day prior to decompensation, likely reflection of improving uremia.   P -supportive care tx underlying causes -following BMP & will order AM NH3  -further syncope investigation after improved critical illness    Endotracheally intubated post procedure -intubated for procedure 5/31  -did have compensatory tachypnea preceding, but no resp distress, resp failure, poor airway protection.  P -VAP, pulm hygiene -wean settings as able but would not pursue extubation at this time -- think makes sense to let her stabilize today and discuss endoscopic eval with GI, for which she would req ETT -fent prop for sedation. Can change to precedex if needed   Shock: hemorrhagic +/- septic -lack of active melena (smear only, following foul loose stool 5/30 day + normal HR suggested against hem shock driving hypotension  despite anemia -- until late 5/30 day when abrupt brisk melena commenced and pressor req started to incr) -in d/w husband 5/30 afternoon, pt had remarkably poor PO intake at home x1 week, no diarrhea no dark stools no bloody stools, prompting concern for hypovolemia 2/2 dehydration  -cont to have WBC > 40. Cdiff neg, GI panel still pending P -Cont product resusc as below -NE for MAP 65 or SBP 90 --  -abx as below   GIB s/p GDA embo  ABLA (?superimposed on other anemia) Thrombocytopenia Coagulopathy  Hypofibrinogenemia P -cont product resusc prn -serial H/H + dic  -will give add'l 2g calglu -cont PPI -Will need egd with LBGI this admisison  Suspected sepsis Leukocytosis  -Cdiff neg, cont to have WBC >40. Report of "foul loose stool" inpt (hrs before she started having brisk melena) which prompted GI infection investigation -PCT is not that impressive given degree of leukocytosis -- only 0.65  P -cont enteric precautions until remaining GI panel  results -cont cefepime flagyl  -f/u remaining outstanding cx data -if no infection identified, with myriad of other heme abnormalities (prior to hem shock) would consider heme/onc workup   AKI  Uremia NAGMA -Cr BUN are improving 5/31 -- think admitting AKI was largely 2/2 degree of dehydration at time of admission  P -cont mIVF -- contrast load w AKI -follow renal indices and UOP, making great urine today   Transaminitis // shock liver  P -trend LFTs   Afib, rate controlled P -cardiac monitoring -optimize lytes - -will check a mag and modestly supplement K 3.9  -no ac, gib   Lactic acidosis -trend PRN   Hyperphosphatemia Hypomagnesemia  Hypocalcemia  P -getting 2g add'l cal glu given transfusion req  -follow lytes -- add on mag phos to cmp   Hx HTN P -hold home meds w hypotension   Hypothyroidism  -holding home synthroid, have a window before we would need to make IV   Misc heme abnormalities -at time of admission marked leukocytosis w left shift, anemia, and normal plt  in setting of profound dehydration and some degree of GIB (which declared 5/30 late day shift).  - Prior  to hemorrhagic decompensation late 5/30-- normal B12, normal Fe, UIBC, low TIBC, high sat ratio, high ferritin, low transferrin, high LDH, normal retic abs but high immature retic. Bilis only marginally elevated and hapto still pending -would rec essentially rechecking all of this later this admission once critical illness improves-- pending results at that time would consider heme/onc engagement    Best Practice (right click and "Reselect all SmartList Selections" daily)   Diet/type: NPO DVT prophylaxis: other SCD  GI prophylaxis: N/A  PPI  Lines: N/A Foley:  N/A Code Status:  full code Last date of multidisciplinary goals of care discussion [ ]   Labs   CBC: Recent Labs  Lab 04/21/23 1845 04/22/23 0535 04/22/23 1203 04/22/23 1631 04/23/23 0424 04/23/23 0425  WBC 55.6* 48.9*  --   43.6*  --  41.6*  NEUTROABS  --  39.6*  --   --   --   --   HGB 4.4* 6.7* 7.8* 6.2*  --  6.9*  HCT 14.0* 18.5* 23.5* 18.2*  --  21.3*  MCV 98.6 86.4  --  91.0  --  95.5  PLT 243 168  --  160 104* 104*    Basic Metabolic Panel: Recent Labs  Lab 04/21/23 1845 04/22/23 0001 04/22/23 0535 04/22/23 0915 04/22/23 1202 04/22/23 1631 04/23/23 0120 04/23/23 0753  NA 128*  --  131*  --   --   --   --  135  K 5.2*   < > 5.5* 4.7 4.4 4.2 3.5 3.9  CL 97*  --  104  --   --   --   --  109  CO2 15*  --  18*  --   --   --   --  18*  GLUCOSE 164*  --  128*  --   --   --   --  176*  BUN 115*  --  115*  --   --   --   --  65*  CREATININE 3.05*  --  2.95*  --   --   --   --  1.48*  CALCIUM 7.6*  --  6.8*  --   --   --   --  7.2*  MG  --   --  1.6*  --   --   --   --   --   PHOS  --   --  5.5*  --   --   --   --   --    < > = values in this interval not displayed.   GFR: Estimated Creatinine Clearance: 39.2 mL/min (A) (by C-G formula based on SCr of 1.48 mg/dL (H)). Recent Labs  Lab 04/21/23 1845 04/21/23 2149 04/22/23 0154 04/22/23 0535 04/22/23 1631 04/23/23 0425  PROCALCITON  --   --   --  0.65  --   --   WBC 55.6*  --   --  48.9* 43.6* 41.6*  LATICACIDVEN 8.8* 8.9* 8.5* 3.1*  --   --     Liver Function Tests: Recent Labs  Lab 04/21/23 1845 04/22/23 1631 04/23/23 0753  AST 40  --  241*  ALT 21  --  333*  ALKPHOS 26*  --  25*  BILITOT 0.7 1.8* 1.2  PROT 4.7*  --  3.7*  ALBUMIN 2.5*  --  2.0*   Recent Labs  Lab 04/21/23 1845  LIPASE 35   No results for input(s): "AMMONIA" in the last 168 hours.  ABG  Component Value Date/Time   PHART 7.29 (L) 04/23/2023 0505   PCO2ART 40 04/23/2023 0505   PO2ART 397 (H) 04/23/2023 0505   HCO3 19.2 (L) 04/23/2023 0505   ACIDBASEDEF 7.0 (H) 04/23/2023 0505   O2SAT 100 04/23/2023 0505     Coagulation Profile: Recent Labs  Lab 04/21/23 1845 04/23/23 0424  INR 1.3* 1.4*    Cardiac Enzymes: No results for input(s):  "CKTOTAL", "CKMB", "CKMBINDEX", "TROPONINI" in the last 168 hours.  HbA1C: Hgb A1c MFr Bld  Date/Time Value Ref Range Status  03/14/2018 01:42 PM 5.5 4.8 - 5.6 % Final    Comment:             Prediabetes: 5.7 - 6.4          Diabetes: >6.4          Glycemic control for adults with diabetes: <7.0   02/11/2015 08:24 AM 5.5 <5.7 % Final    Comment:                                                                           According to the ADA Clinical Practice Recommendations for 2011, when HbA1c is used as a screening test:     >=6.5%   Diagnostic of Diabetes Mellitus            (if abnormal result is confirmed)   5.7-6.4%   Increased risk of developing Diabetes Mellitus   References:Diagnosis and Classification of Diabetes Mellitus,Diabetes Care,2011,34(Suppl 1):S62-S69 and Standards of Medical Care in         Diabetes - 2011,Diabetes Care,2011,34 (Suppl 1):S11-S61.       CBG: Recent Labs  Lab 04/21/23 1812 04/21/23 1922  GLUCAP 87 71    CRITICAL CARE Performed by: Lanier Clam   Total critical care time: 78 minutes  Critical care time was exclusive of separately billable procedures and treating other patients. Critical care was necessary to treat or prevent imminent or life-threatening deterioration.  Critical care was time spent personally by me on the following activities: development of treatment plan with patient and/or surrogate as well as nursing, discussions with consultants, evaluation of patient's response to treatment, examination of patient, obtaining history from patient or surrogate, ordering and performing treatments and interventions, ordering and review of laboratory studies, ordering and review of radiographic studies, pulse oximetry and re-evaluation of patient's condition.  Tessie Fass MSN, AGACNP-BC [facility] Pulmonary/Critical Care Medicine Amion for pager 04/23/2023, 9:21 AM

## 2023-04-23 NOTE — Sedation Documentation (Addendum)
Sheath pull, angioseal deployed-Dr Mir, gauze+tegaderm applied

## 2023-04-23 NOTE — Procedures (Signed)
Intubation Procedure Note  Nicole Peters  [id number]  23-Dec-1950  Date:04/23/23  Time:2:02 AM   Provider Performing:Nieves Barberi Gaynell Face    Procedure: Intubation (31500)  Indication(s) Respiratory Failure  Consent Risks of the procedure as well as the alternatives and risks of each were explained to the patient and/or caregiver.  Consent for the procedure was obtained and is signed in the bedside chart   Anesthesia Etomidate, Versed, and Fentanyl   Time Out Verified patient identification, verified procedure, site/side was marked, verified correct patient position, special equipment/implants available, medications/allergies/relevant history reviewed, required imaging and test results available.   Sterile Technique Usual hand hygeine, masks, and gloves were used   Procedure Description Patient positioned in bed supine.  Sedation given as noted above.  Patient was intubated with endotracheal tube using Glidescope.  View was Grade 2 only posterior commissure .  Number of attempts was 1.  Colorimetric CO2 detector was consistent with tracheal placement.   Complications/Tolerance None; patient tolerated the procedure well. Chest X-ray is ordered to verify placement.   EBL Minimal   Specimen(s) None   Dr Mir with IR at bedside and states will obtain cxr down in IR suite.

## 2023-04-23 NOTE — Progress Notes (Signed)
   04/23/23 1928  Vent Select  Invasive or Noninvasive Invasive  Adult Vent Y  Airway 7.5 mm  Placement Date/Time: 04/23/23 0155   Placed By: ICU physician  Airway Device: Endotracheal Tube  Laryngoscope Blade: 3  ETT Types: Oral  Size (mm): 7.5 mm  Cuffed: Cuffed  Insertion attempts: 1  Airway Equipment: Stylet;Video Laryngoscope  Placement C...  Secured at (cm) 23 cm  Measured From Lips  Secured Location Right  Secured By Brewing technologist Repositioned Yes  Prone position No  Adult Ventilator Settings  Vent Type Servo i  Humidity HME  Vent Mode PRVC  Vt Set 430 mL  Set Rate 20 bmp  FiO2 (%) 40 %  I Time 0.91 Sec(s)  PEEP 5 cmH20  Adult Ventilator Measurements  Peak Airway Pressure 21 L/min  Mean Airway Pressure 10 cmH20  Plateau Pressure 15 cmH20  Resp Rate Spontaneous 0 br/min  Resp Rate Total 20 br/min  Exhaled Vt 467 mL  Measured Ve 8.8 L  I:E Ratio Measured 1:2.3  Auto PEEP 0 cmH20  Total PEEP 5 cmH20  Adult Ventilator Alarms  Alarms On Y  Ve High Alarm 24 L/min  Ve Low Alarm 4 L/min  Resp Rate High Alarm 38 br/min  Resp Rate Low Alarm 8  PEEP Low Alarm 3 cmH2O  Press High Alarm 45 cmH2O  VAP Prevention  HOB> 30 Degrees Y  Equipment wiped down Yes  Breath Sounds  Bilateral Breath Sounds Clear;Diminished  R Upper  Breath Sounds Clear  L Upper Breath Sounds Clear;Diminished  R Lower Breath Sounds Diminished  L Lower Breath Sounds Diminished  Airway Suctioning/Secretions  Suction Type ETT  Suction Device  Catheter  Secretion Amount Moderate  Secretion Color White  Secretion Consistency Thick  Suction Tolerance Tolerated well  Suctioning Adverse Effects None

## 2023-04-23 NOTE — Sedation Documentation (Signed)
Patient arrived to IR with RN and Respiratory, vented and sedated

## 2023-04-23 NOTE — Progress Notes (Signed)
eLink Physician-Brief Progress Note Patient Name: Nicole Peters DOB: 1951/04/29 MRN: [id number]   Date of Service  04/23/2023  HPI/Events of Note  Patient continues to bleed.  Multiple phone conversations held with multiple providers on plan forward.  eICU Interventions  Spoke to IR, [facility] and [facility].  Dr. Gaynell Face also spoke to multiple teams and then to me. Final plan is for patient to be sent to IR suite for embolization with GI being on standby if needed.  Patient will be intubated prior to procedure.  She remains critically ill.     Intervention Category Major Interventions: Hemorrhage - evaluation and management;Shock - evaluation and management  Carilyn Goodpasture 04/23/2023, 1:15 AM

## 2023-04-24 DIAGNOSIS — K269 Duodenal ulcer, unspecified as acute or chronic, without hemorrhage or perforation: Secondary | ICD-10-CM | POA: Diagnosis not present

## 2023-04-24 DIAGNOSIS — K264 Chronic or unspecified duodenal ulcer with hemorrhage: Secondary | ICD-10-CM | POA: Diagnosis not present

## 2023-04-24 DIAGNOSIS — J988 Other specified respiratory disorders: Secondary | ICD-10-CM | POA: Diagnosis not present

## 2023-04-24 DIAGNOSIS — D62 Acute posthemorrhagic anemia: Secondary | ICD-10-CM | POA: Diagnosis not present

## 2023-04-24 DIAGNOSIS — R579 Shock, unspecified: Secondary | ICD-10-CM | POA: Diagnosis not present

## 2023-04-24 DIAGNOSIS — D696 Thrombocytopenia, unspecified: Secondary | ICD-10-CM

## 2023-04-24 DIAGNOSIS — N179 Acute kidney failure, unspecified: Secondary | ICD-10-CM | POA: Diagnosis not present

## 2023-04-24 DIAGNOSIS — R578 Other shock: Secondary | ICD-10-CM | POA: Diagnosis not present

## 2023-04-24 LAB — GLUCOSE, CAPILLARY
Glucose-Capillary: 114 mg/dL — ABNORMAL HIGH (ref 70–99)
Glucose-Capillary: 75 mg/dL (ref 70–99)
Glucose-Capillary: 87 mg/dL (ref 70–99)
Glucose-Capillary: 49 mg/dL — ABNORMAL LOW (ref 70–99)
Glucose-Capillary: 93 mg/dL (ref 70–99)

## 2023-04-24 LAB — DIC (DISSEMINATED INTRAVASCULAR COAGULATION)PANEL
D-Dimer, Quant: 4.97 ug/mL-FEU — ABNORMAL HIGH (ref 0.00–0.50)
D-Dimer, Quant: 7.84 ug/mL-FEU — ABNORMAL HIGH (ref 0.00–0.50)
Fibrinogen: 329 mg/dL (ref 210–475)
Fibrinogen: 317 mg/dL (ref 210–475)
INR: 1.2 (ref 0.8–1.2)
INR: 1.3 — ABNORMAL HIGH (ref 0.8–1.2)
Platelets: 87 10*3/uL — ABNORMAL LOW (ref 150–400)
Platelets: 89 10*3/uL — ABNORMAL LOW (ref 150–400)
Prothrombin Time: 15.5 seconds — ABNORMAL HIGH (ref 11.4–15.2)
Prothrombin Time: 16.1 seconds — ABNORMAL HIGH (ref 11.4–15.2)
Smear Review: NONE SEEN
Smear Review: NONE SEEN
aPTT: 31 seconds (ref 24–36)
aPTT: 30 seconds (ref 24–36)

## 2023-04-24 LAB — TYPE AND SCREEN
Unit division: 0
Unit division: 0

## 2023-04-24 LAB — BASIC METABOLIC PANEL
Anion gap: 4 — ABNORMAL LOW (ref 5–15)
BUN: 42 mg/dL — ABNORMAL HIGH (ref 8–23)
CO2: 23 mmol/L (ref 22–32)
Calcium: 7.5 mg/dL — ABNORMAL LOW (ref 8.9–10.3)
Chloride: 110 mmol/L (ref 98–111)
Creatinine, Ser: 1.09 mg/dL — ABNORMAL HIGH (ref 0.44–1.00)
GFR, Estimated: 54 mL/min — ABNORMAL LOW (ref >60)
Glucose, Bld: 127 mg/dL — ABNORMAL HIGH (ref 70–99)
Potassium: 3.9 mmol/L (ref 3.5–5.1)
Sodium: 137 mmol/L (ref 135–145)

## 2023-04-24 LAB — PREPARE CRYOPRECIPITATE
Unit division: 0
Unit division: 0

## 2023-04-24 LAB — HEMOGLOBIN AND HEMATOCRIT, BLOOD
HCT: 22.9 % — ABNORMAL LOW (ref 36.0–46.0)
HCT: 23 % — ABNORMAL LOW (ref 36.0–46.0)
HCT: 23.7 % — ABNORMAL LOW (ref 36.0–46.0)
Hemoglobin: 7.6 g/dL — ABNORMAL LOW (ref 12.0–15.0)
Hemoglobin: 7.6 g/dL — ABNORMAL LOW (ref 12.0–15.0)
Hemoglobin: 7.7 g/dL — ABNORMAL LOW (ref 12.0–15.0)

## 2023-04-24 LAB — BPAM RBC
Blood Product Expiration Date: 202406202359
Blood Product Expiration Date: 202406202359
Blood Product Expiration Date: 202406222359

## 2023-04-24 LAB — HEPATIC FUNCTION PANEL
ALT: 226 U/L — ABNORMAL HIGH (ref 0–44)
AST: 124 U/L — ABNORMAL HIGH (ref 15–41)
Albumin: 2 g/dL — ABNORMAL LOW (ref 3.5–5.0)
Alkaline Phosphatase: 28 U/L — ABNORMAL LOW (ref 38–126)
Bilirubin, Direct: 0.3 mg/dL — ABNORMAL HIGH (ref 0.0–0.2)
Indirect Bilirubin: 0.5 mg/dL (ref 0.3–0.9)
Total Bilirubin: 0.8 mg/dL (ref 0.3–1.2)
Total Protein: 3.7 g/dL — ABNORMAL LOW (ref 6.5–8.1)

## 2023-04-24 LAB — BLOOD GAS, ARTERIAL
Acid-base deficit: 7 mmol/L — ABNORMAL HIGH (ref 0.0–2.0)
Drawn by: 56037
pCO2 arterial: 40 mmHg (ref 32–48)
pO2, Arterial: 397 mmHg — ABNORMAL HIGH (ref 83–108)

## 2023-04-24 LAB — AMMONIA: Ammonia: 20 umol/L (ref 9–35)

## 2023-04-24 LAB — BPAM CRYOPRECIPITATE
Blood Product Expiration Date: 202405311318
Blood Product Expiration Date: 202405311320
ISSUE DATE / TIME: 202405310840
ISSUE DATE / TIME: 202405310934
Unit Type and Rh: 6200

## 2023-04-24 LAB — TRIGLYCERIDES: Triglycerides: 129 mg/dL (ref <150)

## 2023-04-24 MED ORDER — DEXTROSE 10 % IV SOLN: INTRAVENOUS | Status: DC

## 2023-04-24 MED ORDER — DEXTROSE 50 % IV SOLN
INTRAVENOUS | Status: AC
  Administered 2023-04-24: 50 mL
  Filled 2023-04-24: qty 50

## 2023-04-24 MED ORDER — INSULIN ASPART 100 UNIT/ML IJ SOLN
0.0000 [IU] | INTRAMUSCULAR | Status: DC
  Administered 2023-04-26 – 2023-04-28 (×4): 3 [IU] via SUBCUTANEOUS

## 2023-04-24 NOTE — Progress Notes (Signed)
Awake, following commands.  Tolerating SBT.  Will proceed with extubation.  Updated pt's husband at bedside.  Coralyn Helling, MD [facility] Pulmonary/Critical Care Pager - [phone] or [phone] 04/24/2023, 2:21 PM

## 2023-04-24 NOTE — Progress Notes (Addendum)
eLink Physician-Brief Progress Note Patient Name: Nicole Peters DOB: 13-Jul-1951 MRN: [id number]   Date of Service  04/24/2023  HPI/Events of Note  RN reports pt just had large brown liquid BM w/red blood in it.  Hgb 7.9 -> 7.6, goal per GI is around 8. Stable 89 up from 87. S/p IR for GDA. Coiling earlier in the shift. Discussed with RN. On quad strength levo at 15. EGD planned for Monday or Tuesday. Got total over 5 units, non on this shift.   Camera: obese, on Vent. HR 67. MAP > 65.  eICU Interventions  Follow Hg at 6 AM. If dropping to transfuse. GI follow through in AM.      Intervention Category Intermediate Interventions: Bleeding - evaluation and treatment with blood products  Ranee Gosselin 04/24/2023, 4:41 AM

## 2023-04-24 NOTE — Procedures (Signed)
Extubation Procedure Note  Patient Details:   Name: Nicole Peters DOB: 12/30/1950 MRN: [id number]   Airway Documentation:    Vent end date: 04/24/23 Vent end time: 1424   Evaluation  O2 sats: stable throughout Complications: No apparent complications Patient did tolerate procedure well. Bilateral Breath Sounds: Clear, Diminished   Placed pt on 4L nasal cannula. Pt is able to speak.  Renold Genta 04/24/2023, 2:27 PM

## 2023-04-24 NOTE — Progress Notes (Signed)
NAME:  Nicole Peters, MRN:  [id number], DOB:  04-01-1951, LOS: 3 ADMISSION DATE:  04/21/2023, CONSULTATION DATE:  5/29 REFERRING MD:  Dr. Wilkie Aye, CHIEF COMPLAINT: Syncope  History of Present Illness:  72 yo female former smoker presented to [facility] ER with AMS after falling at home and striking her face 3 days prior to admission.  Had decreased appetite with poor fluid intake, and tarry stools.  CT head/face/neck were negative.  Found to have Na 128, CO2 15, BUN 115, Creatinine 3.05, lactic acid 8.8, WBC 55.6, Hb 4.4.  PCCM asked to admit to ICU in the setting of renal failure, acidosis, and symptomatic anemia.   Pertinent  Medical History  Allergies, Anxiety, OA, Depression, HLD, HTN, Osteoporosis, Stress incontinence  Significant Hospital Events: Including procedures, antibiotic start and stop dates in addition to other pertinent events   5/29 admitted after AMS/ syncope, hgb 4.4 5/30 started on low dose pressors, more confused, getting add'l PRBC. Sent for CTA GI after a lot of pushback, after the pt went from foul smears during the day to brisk melena abruptly.  Transient A fib. 5/31 GDA embo. Intubated for this and remains so after. Ongoing product resucs.    Studies:  Renal u/s 5/30 >> no acute findings CT angio GI bleed 5/30 >> active extravasation from mid-gastroduodenal artery, coronary calcification, small HH, active hemorrhage from 2nd portion of duodenum, severe sigmoid diverticulosis  Interim History / Subjective:  Remains on vent, sedation, pressors.  Objective   Blood pressure (!) 121/45, pulse 72, temperature 98 F (36.7 C), temperature source Axillary, resp. rate 12, height 5\' 4"  (1.626 m), weight 103.8 kg, SpO2 100 %.    Vent Mode: PRVC FiO2 (%):  [40 %] 40 % Set Rate:  [20 bmp] 20 bmp Vt Set:  [430 mL] 430 mL PEEP:  [5 cmH20] 5 cmH20 Plateau Pressure:  [14 cmH20-25 cmH20] 14 cmH20   Intake/Output Summary (Last 24 hours) at 04/24/2023 0843 Last data filed at 04/24/2023  0800 Gross per 24 hour  Intake 5618.57 ml  Output 1675 ml  Net 3943.57 ml   Filed Weights   04/22/23 0104 04/23/23 0700 04/24/23 0348  Weight: 97 kg 98.8 kg 103.8 kg    Physical Exam:  General - sedated Eyes - pupils reactive ENT - ETT in place Cardiac - regular rate/rhythm, no murmur Chest - equal breath sounds b/l, no wheezing or rales Abdomen - soft, non tender, + bowel sounds Extremities - no cyanosis, clubbing, or edema Skin - bruising around cheeks Neuro - RASS -1, follows commands appropriately  Resolved Hospital Problem list   Hyponatermia, Lactic acidosis, Hyperkalemia, AKI from ATN 2nd to sepsis/NSAIDS/Hypovolemia/ACE inhibitor, Elevated LFT's from shock  Assessment & Plan:   Upper GI bleeding most likely from NSAID induced duodenal ulcer. - continue protonix - s/p celiac angiogram with gastroduodenal artery embolization by IR on 5/31 - GI planning repeat EGD prior to discharge, or sooner if she bleeds again  Acute blood loss anemia. - f/u CBC - transfuse for Hb < 7  Thrombocytopenia. - likely from consumption in setting of GI bleeding, uremia, and sepsis - f/u CBC  Leukocytosis. - concern for sepsis, but source unclear - C diff and GI PCR negative - f/u blood cultures from 5/29 - day 4 of cefepime, flagyl  Acute metabolic encephalopathy 2nd to renal failure, acidosis, sepsis. Hx of anxiety, depression. - limit sedation while weaning vent - hold outpt xanax  Compromised airway in setting of GI bleeding. - hopefully  can extubate soon  Hypotension. - from GI bleeding with acute blood loss, sepsis, hypovolemia, and sedation - wean pressors to keep MAP > 65  Paroxysmal atrial fibrillation. Hx of HTN, HLD. - in setting of acute stress - in sinus rhythm - monitor on telemetry - hold outpt norvasc, lotensin, zocor  Hx of allergic rhinitis. - hold outpt singulair, zyrtec  Hx of hypothyroidism. - resume synthroid when able to take  pills  Hyperglycemia. - SSI - check HbA1C  D/w Dr. Tomasa Rand with GI.   Best Practice (right click and "Reselect all SmartList Selections" daily)   Diet/type: NPO DVT prophylaxis: SCD GI prophylaxis: PPI  Lines: Central line (Rt PICC) Foley:  Yes, and it is still needed Code Status:  full code Last date of multidisciplinary goals of care discussion [ ]   Labs       Latest Ref Rng & Units 04/24/2023    4:06 AM 04/23/2023    6:12 PM 04/23/2023    7:53 AM  CMP  Glucose 70 - 99 mg/dL 161  096  045   BUN 8 - 23 mg/dL 42  51  65   Creatinine 0.44 - 1.00 mg/dL 4.09  8.11  9.14   Sodium 135 - 145 mmol/L 137  136  135   Potassium 3.5 - 5.1 mmol/L 3.9  4.0  3.9   Chloride 98 - 111 mmol/L 110  109  109   CO2 22 - 32 mmol/L 23  22  18    Calcium 8.9 - 10.3 mg/dL 7.5  7.7  7.2   Total Protein 6.5 - 8.1 g/dL 3.7   3.7   Total Bilirubin 0.3 - 1.2 mg/dL 0.8   1.2   Alkaline Phos 38 - 126 U/L 28   25   AST 15 - 41 U/L 124   241   ALT 0 - 44 U/L 226   333        Latest Ref Rng & Units 04/24/2023    6:28 AM 04/24/2023    4:06 AM 04/24/2023    4:05 AM  CBC  Hemoglobin 12.0 - 15.0 g/dL 7.6   7.6   Hematocrit 36.0 - 46.0 % 23.0   22.9   Platelets 150 - 400 K/uL  89      ABG    Component Value Date/Time   PHART 7.29 (L) 04/23/2023 0505   PCO2ART 40 04/23/2023 0505   PO2ART 397 (H) 04/23/2023 0505   HCO3 19.2 (L) 04/23/2023 0505   ACIDBASEDEF 7.0 (H) 04/23/2023 0505   O2SAT 100 04/23/2023 0505    CBG (last 3)  Recent Labs    04/21/23 1812 04/21/23 1922  GLUCAP 87 71    Critical care time: 44 minutes  Coralyn Helling, MD [facility] Pulmonary/Critical Care Pager - [phone] or [phone] 04/24/2023, 8:44 AM

## 2023-04-24 NOTE — Progress Notes (Signed)
[facility] ROUNDING NOTE   Subjective: Nursing reports large brown liquid bowel movement with red blood early this morning.  Hgb has drifted down to 7.6.  BUN continues to downtrend to 42 from 65 yesterday.  Pressor requirement stable.  No other events overnight.  Her renal function has improved.   Objective: Vital signs in last 24 hours: Temp:  [97.3 F (36.3 C)-98.7 F (37.1 C)] 98 F (36.7 C) (06/01 0755) Pulse Rate:  [60-94] 72 (06/01 0800) Resp:  [11-25] 12 (06/01 0800) BP: (92-154)/(22-98) 121/45 (06/01 0800) SpO2:  [89 %-100 %] 100 % (06/01 0800) FiO2 (%):  [40 %] 40 % (06/01 0800) Weight:  [103.8 kg] 103.8 kg (06/01 0348) Last BM Date : 04/22/23 General: NAD, ill, intubated, sedated Lungs:  CTA b/l, no w/r/r Heart:  RRR, no m/r/g Abdomen:  Soft, some wincing noted with epigastric palpation, ND, +BS Ext:  Upper and lower extremity edema, extensive bruising    Intake/Output from previous day: 05/31 0701 - 06/01 0700 In: 5717.5 [I.V.:4360; Blood:657; IV Piggyback:700.4] Out: 1825 [Urine:1825] Intake/Output this shift: Total I/O In: 150.3 [I.V.:150.3] Out: -    Lab Results: Recent Labs    04/22/23 1631 04/23/23 0424 04/23/23 0425 04/23/23 1208 04/23/23 1812 04/23/23 2142 04/23/23 2143 04/24/23 0405 04/24/23 0406 04/24/23 0628  WBC 43.6*  --  41.6*  --  41.6*  --   --   --   --   --   HGB 6.2*  --  6.9*   < > 8.2* 7.9*  --  7.6*  --  7.6*  PLT 160   < > 104*   < > 92*  92*  --  87*  --  89*  --   MCV 91.0  --  95.5  --  90.9  --   --   --   --   --    < > = values in this interval not displayed.   BMET Recent Labs    04/23/23 0753 04/23/23 1812 04/24/23 0406  NA 135 136 137  K 3.9 4.0 3.9  CL 109 109 110  CO2 18* 22 23  GLUCOSE 176* 139* 127*  BUN 65* 51* 42*  CREATININE 1.48* 1.14* 1.09*  CALCIUM 7.2* 7.7* 7.5*   LFT Recent Labs    04/21/23 1845 04/22/23 1631 04/23/23 0753 04/24/23 0406  PROT 4.7*  --  3.7* 3.7*   ALBUMIN 2.5*  --  2.0* 2.0*  AST 40  --  241* 124*  ALT 21  --  333* 226*  ALKPHOS 26*  --  25* 28*  BILITOT 0.7 1.8* 1.2 0.8  BILIDIR  --  0.5*  --  0.3*  IBILI  --  1.3*  --  0.5   PT/INR Recent Labs    04/23/23 2143 04/24/23 0406  INR 1.2 1.3*      Imaging/Other results: IR EMBO TUMOR ORGAN ISCHEMIA INFARCT INC GUIDE ROADMAPPING  Result Date: 04/23/2023 INDICATION: 72 year old woman with acute upper GI bleed resulting in hemodynamic instability. Interventional radiology consulted angiogram and gastroduodenal artery embolization. EXAM: 1. Ultrasound-guided access of right common femoral artery. 2. Celiac arteriogram. 3. Common hepatic arteriogram. 4. Gastro duodenal arteriogram and coil embolization. 5. Angio-Seal closure of right common femoral artery. MEDICATIONS: None ANESTHESIA/SEDATION: None CONTRAST:  60 mL Omnipaque 300 intra-arterial FLUOROSCOPY: Radiation Exposure Index (as provided by the fluoroscopic device): 1013 mGy Kerma COMPLICATIONS: None immediate. PROCEDURE: Informed consent was obtained from the patient's husband Jonny Ruiz following explanation of the procedure, risks, benefits  and alternatives. The patient understands, agrees and consents for the procedure. All questions were addressed. A time out was performed prior to the initiation of the procedure. Maximal barrier sterile technique utilized including caps, mask, sterile gowns, sterile gloves, large sterile drape, hand hygiene, and chlorhexidine prep. Ultrasound image documenting patency of the right common femoral artery was obtained and placed in permanent medical record. Sterile ultrasound probe cover and gel utilized throughout the procedure. Utilizing continuous ultrasound guidance, the right common femoral artery was accessed at the level of the femoral head with a 21 gauge needle. 21 gauge needle exchanged for a transitional dilator set over 0.018 inch guidewire. Transitional dilator set exchanged for 5 French sheath  over 0.035 inch guidewire. Celiac artery selected with Sos Omni catheter. Celiac angiogram showed patent splenic, left gastric, and common hepatic artery branches. Mild irregularity of the distal gastro duodenal artery was noted. No active extravasation was seen. The common hepatic artery was then selected with Progreat microcatheter. Angiogram demonstrated patent gastro duodenal and proper hepatic arteries. Gastro duodenal artery was selected with Progreat microcatheter. Angiogram was performed, however no active extravasation was identified. Coil embolization of the gastro duodenal artery was performed with a combination of 3 and 4 mm Ruby coils. Post celiac angiogram showed significantly decreased GDA flow with no active extravasation. Sheath angiogram showed appropriate puncture of the common femoral artery at the level of the femoral head. The groin was re-prepped and draped and closure was performed with 6 [place] Angio-Seal device. Two additional minutes of manual compression applied at the access site for hemostasis. IMPRESSION: Gastroduodenal artery embolization performed for treatment of acute severe upper GI bleed. Electronically Signed   By: Acquanetta Belling M.D.   On: 04/23/2023 11:45   IR US Guide Vasc Access Right  Result Date: 04/23/2023 INDICATION: 72 year old woman with acute upper GI bleed resulting in hemodynamic instability. Interventional radiology consulted angiogram and gastroduodenal artery embolization. EXAM: 1. Ultrasound-guided access of right common femoral artery. 2. Celiac arteriogram. 3. Common hepatic arteriogram. 4. Gastro duodenal arteriogram and coil embolization. 5. Angio-Seal closure of right common femoral artery. MEDICATIONS: None ANESTHESIA/SEDATION: None CONTRAST:  60 mL Omnipaque 300 intra-arterial FLUOROSCOPY: Radiation Exposure Index (as provided by the fluoroscopic device): 1013 mGy Kerma COMPLICATIONS: None immediate. PROCEDURE: Informed consent was obtained from the  patient's husband Jonny Ruiz following explanation of the procedure, risks, benefits and alternatives. The patient understands, agrees and consents for the procedure. All questions were addressed. A time out was performed prior to the initiation of the procedure. Maximal barrier sterile technique utilized including caps, mask, sterile gowns, sterile gloves, large sterile drape, hand hygiene, and chlorhexidine prep. Ultrasound image documenting patency of the right common femoral artery was obtained and placed in permanent medical record. Sterile ultrasound probe cover and gel utilized throughout the procedure. Utilizing continuous ultrasound guidance, the right common femoral artery was accessed at the level of the femoral head with a 21 gauge needle. 21 gauge needle exchanged for a transitional dilator set over 0.018 inch guidewire. Transitional dilator set exchanged for 5 French sheath over 0.035 inch guidewire. Celiac artery selected with Sos Omni catheter. Celiac angiogram showed patent splenic, left gastric, and common hepatic artery branches. Mild irregularity of the distal gastro duodenal artery was noted. No active extravasation was seen. The common hepatic artery was then selected with Progreat microcatheter. Angiogram demonstrated patent gastro duodenal and proper hepatic arteries. Gastro duodenal artery was selected with Progreat microcatheter. Angiogram was performed, however no active extravasation was identified. Coil embolization  of the gastro duodenal artery was performed with a combination of 3 and 4 mm Ruby coils. Post celiac angiogram showed significantly decreased GDA flow with no active extravasation. Sheath angiogram showed appropriate puncture of the common femoral artery at the level of the femoral head. The groin was re-prepped and draped and closure was performed with 6 [place] Angio-Seal device. Two additional minutes of manual compression applied at the access site for hemostasis. IMPRESSION:  Gastroduodenal artery embolization performed for treatment of acute severe upper GI bleed. Electronically Signed   By: Acquanetta Belling M.D.   On: 04/23/2023 11:45   IR Angiogram Visceral Selective  Result Date: 04/23/2023 INDICATION: 72 year old woman with acute upper GI bleed resulting in hemodynamic instability. Interventional radiology consulted angiogram and gastroduodenal artery embolization. EXAM: 1. Ultrasound-guided access of right common femoral artery. 2. Celiac arteriogram. 3. Common hepatic arteriogram. 4. Gastro duodenal arteriogram and coil embolization. 5. Angio-Seal closure of right common femoral artery. MEDICATIONS: None ANESTHESIA/SEDATION: None CONTRAST:  60 mL Omnipaque 300 intra-arterial FLUOROSCOPY: Radiation Exposure Index (as provided by the fluoroscopic device): 1013 mGy Kerma COMPLICATIONS: None immediate. PROCEDURE: Informed consent was obtained from the patient's husband Jonny Ruiz following explanation of the procedure, risks, benefits and alternatives. The patient understands, agrees and consents for the procedure. All questions were addressed. A time out was performed prior to the initiation of the procedure. Maximal barrier sterile technique utilized including caps, mask, sterile gowns, sterile gloves, large sterile drape, hand hygiene, and chlorhexidine prep. Ultrasound image documenting patency of the right common femoral artery was obtained and placed in permanent medical record. Sterile ultrasound probe cover and gel utilized throughout the procedure. Utilizing continuous ultrasound guidance, the right common femoral artery was accessed at the level of the femoral head with a 21 gauge needle. 21 gauge needle exchanged for a transitional dilator set over 0.018 inch guidewire. Transitional dilator set exchanged for 5 French sheath over 0.035 inch guidewire. Celiac artery selected with Sos Omni catheter. Celiac angiogram showed patent splenic, left gastric, and common hepatic artery  branches. Mild irregularity of the distal gastro duodenal artery was noted. No active extravasation was seen. The common hepatic artery was then selected with Progreat microcatheter. Angiogram demonstrated patent gastro duodenal and proper hepatic arteries. Gastro duodenal artery was selected with Progreat microcatheter. Angiogram was performed, however no active extravasation was identified. Coil embolization of the gastro duodenal artery was performed with a combination of 3 and 4 mm Ruby coils. Post celiac angiogram showed significantly decreased GDA flow with no active extravasation. Sheath angiogram showed appropriate puncture of the common femoral artery at the level of the femoral head. The groin was re-prepped and draped and closure was performed with 6 [place] Angio-Seal device. Two additional minutes of manual compression applied at the access site for hemostasis. IMPRESSION: Gastroduodenal artery embolization performed for treatment of acute severe upper GI bleed. Electronically Signed   By: Acquanetta Belling M.D.   On: 04/23/2023 11:45   IR Angiogram Selective Each Additional Vessel  Result Date: 04/23/2023 INDICATION: 72 year old woman with acute upper GI bleed resulting in hemodynamic instability. Interventional radiology consulted angiogram and gastroduodenal artery embolization. EXAM: 1. Ultrasound-guided access of right common femoral artery. 2. Celiac arteriogram. 3. Common hepatic arteriogram. 4. Gastro duodenal arteriogram and coil embolization. 5. Angio-Seal closure of right common femoral artery. MEDICATIONS: None ANESTHESIA/SEDATION: None CONTRAST:  60 mL Omnipaque 300 intra-arterial FLUOROSCOPY: Radiation Exposure Index (as provided by the fluoroscopic device): 1013 mGy Kerma COMPLICATIONS: None immediate. PROCEDURE: Informed consent was obtained from  the patient's husband Jonny Ruiz following explanation of the procedure, risks, benefits and alternatives. The patient understands, agrees and consents  for the procedure. All questions were addressed. A time out was performed prior to the initiation of the procedure. Maximal barrier sterile technique utilized including caps, mask, sterile gowns, sterile gloves, large sterile drape, hand hygiene, and chlorhexidine prep. Ultrasound image documenting patency of the right common femoral artery was obtained and placed in permanent medical record. Sterile ultrasound probe cover and gel utilized throughout the procedure. Utilizing continuous ultrasound guidance, the right common femoral artery was accessed at the level of the femoral head with a 21 gauge needle. 21 gauge needle exchanged for a transitional dilator set over 0.018 inch guidewire. Transitional dilator set exchanged for 5 French sheath over 0.035 inch guidewire. Celiac artery selected with Sos Omni catheter. Celiac angiogram showed patent splenic, left gastric, and common hepatic artery branches. Mild irregularity of the distal gastro duodenal artery was noted. No active extravasation was seen. The common hepatic artery was then selected with Progreat microcatheter. Angiogram demonstrated patent gastro duodenal and proper hepatic arteries. Gastro duodenal artery was selected with Progreat microcatheter. Angiogram was performed, however no active extravasation was identified. Coil embolization of the gastro duodenal artery was performed with a combination of 3 and 4 mm Ruby coils. Post celiac angiogram showed significantly decreased GDA flow with no active extravasation. Sheath angiogram showed appropriate puncture of the common femoral artery at the level of the femoral head. The groin was re-prepped and draped and closure was performed with 6 [place] Angio-Seal device. Two additional minutes of manual compression applied at the access site for hemostasis. IMPRESSION: Gastroduodenal artery embolization performed for treatment of acute severe upper GI bleed. Electronically Signed   By: Acquanetta Belling M.D.   On:  04/23/2023 11:45   IR Angiogram Selective Each Additional Vessel  Result Date: 04/23/2023 INDICATION: 72 year old woman with acute upper GI bleed resulting in hemodynamic instability. Interventional radiology consulted angiogram and gastroduodenal artery embolization. EXAM: 1. Ultrasound-guided access of right common femoral artery. 2. Celiac arteriogram. 3. Common hepatic arteriogram. 4. Gastro duodenal arteriogram and coil embolization. 5. Angio-Seal closure of right common femoral artery. MEDICATIONS: None ANESTHESIA/SEDATION: None CONTRAST:  60 mL Omnipaque 300 intra-arterial FLUOROSCOPY: Radiation Exposure Index (as provided by the fluoroscopic device): 1013 mGy Kerma COMPLICATIONS: None immediate. PROCEDURE: Informed consent was obtained from the patient's husband Jonny Ruiz following explanation of the procedure, risks, benefits and alternatives. The patient understands, agrees and consents for the procedure. All questions were addressed. A time out was performed prior to the initiation of the procedure. Maximal barrier sterile technique utilized including caps, mask, sterile gowns, sterile gloves, large sterile drape, hand hygiene, and chlorhexidine prep. Ultrasound image documenting patency of the right common femoral artery was obtained and placed in permanent medical record. Sterile ultrasound probe cover and gel utilized throughout the procedure. Utilizing continuous ultrasound guidance, the right common femoral artery was accessed at the level of the femoral head with a 21 gauge needle. 21 gauge needle exchanged for a transitional dilator set over 0.018 inch guidewire. Transitional dilator set exchanged for 5 French sheath over 0.035 inch guidewire. Celiac artery selected with Sos Omni catheter. Celiac angiogram showed patent splenic, left gastric, and common hepatic artery branches. Mild irregularity of the distal gastro duodenal artery was noted. No active extravasation was seen. The common hepatic  artery was then selected with Progreat microcatheter. Angiogram demonstrated patent gastro duodenal and proper hepatic arteries. Gastro duodenal artery was selected with Progreat  microcatheter. Angiogram was performed, however no active extravasation was identified. Coil embolization of the gastro duodenal artery was performed with a combination of 3 and 4 mm Ruby coils. Post celiac angiogram showed significantly decreased GDA flow with no active extravasation. Sheath angiogram showed appropriate puncture of the common femoral artery at the level of the femoral head. The groin was re-prepped and draped and closure was performed with 6 [place] Angio-Seal device. Two additional minutes of manual compression applied at the access site for hemostasis. IMPRESSION: Gastroduodenal artery embolization performed for treatment of acute severe upper GI bleed. Electronically Signed   By: Acquanetta Belling M.D.   On: 04/23/2023 11:45   DG CHEST PORT 1 VIEW  Result Date: 04/23/2023 CLINICAL DATA:  71 year old female status post intubation. EXAM: PORTABLE CHEST 1 VIEW COMPARISON:  Chest x-ray 04/21/2023. FINDINGS: An endotracheal tube is in place with tip 2.9 cm above the carina. There is a right upper extremity PICC with tip terminating in the superior cavoatrial junction. Lung volumes are low. No consolidative airspace disease. No pleural effusions. No evidence of pulmonary edema. Heart size is normal. Upper mediastinal contours are within normal limits. Atherosclerotic calcifications are noted in the thoracic aorta. IMPRESSION: 1. Support apparatus, as above. 2. Low lung volumes without radiographic evidence of acute cardiopulmonary disease. 3. Aortic atherosclerosis. Electronically Signed   By: Trudie Reed M.D.   On: 04/23/2023 07:38   CT ANGIO GI BLEED  Addendum Date: 04/23/2023   ADDENDUM REPORT: 04/23/2023 00:39 ADDENDUM: These results were called by telephone at the time of interpretation on 04/22/2023 at 11:30 pm to  provider Cloyd Stagers, MD, who verbally acknowledged these results. Electronically Signed   By: Helyn Numbers M.D.   On: 04/23/2023 00:39   Result Date: 04/23/2023 CLINICAL DATA:  Gastrointestinal hemorrhage, melena EXAM: CTA ABDOMEN AND PELVIS WITHOUT AND WITH CONTRAST TECHNIQUE: Multidetector CT imaging of the abdomen and pelvis was performed using the standard protocol during bolus administration of intravenous contrast. Multiplanar reconstructed images and MIPs were obtained and reviewed to evaluate the vascular anatomy. RADIATION DOSE REDUCTION: This exam was performed according to the departmental dose-optimization program which includes automated exposure control, adjustment of the mA and/or kV according to patient size and/or use of iterative reconstruction technique. CONTRAST:  80mL OMNIPAQUE IOHEXOL 350 MG/ML SOLN COMPARISON:  05/24/2014 FINDINGS: VASCULAR Aorta: Extensive atherosclerotic calcification. No aneurysm or dissection. No periaortic inflammatory change or hematoma. Celiac: Less than 50% stenosis of the origin. Distally patent without aneurysm or dissection. Active extravasation noted from the mid gastroduodenal artery best seen on image # 66, series # 5. SMA: Less than 50% stenosis of the origin.  Distally patent. Renals: Both renal arteries are patent without evidence of aneurysm, dissection, vasculitis, fibromuscular dysplasia or significant stenosis. IMA: Patent without evidence of aneurysm, dissection, vasculitis or significant stenosis. Inflow: Patent without evidence of aneurysm, dissection, vasculitis or significant stenosis. Proximal Outflow: Bilateral common femoral and visualized portions of the superficial and profunda femoral arteries are patent without evidence of aneurysm, dissection, vasculitis or significant stenosis. Veins: Unremarkable. Review of the MIP images confirms the above findings. NON-VASCULAR Lower chest: Visualized lung bases are clear. Moderate right coronary artery  calcification. Global cardiac size within normal limits. Small hiatal hernia. Hepatobiliary: Choose 2 Pancreas: Unremarkable Spleen: Unremarkable Adrenals/Urinary Tract: The adrenal glands are unremarkable. Vascular calcification within the left renal hilum. The kidneys are otherwise unremarkable. Foley catheter balloon seen within a decompressed bladder lumen. Stomach/Bowel: There is large volume active hemorrhage involving the second portion  of the duodenum with, as noted above, active extravasation arising from the mid gastroduodenal artery. Severe sigmoid diverticulosis without superimposed acute inflammatory change. The stomach, small bowel, and large bowel are otherwise unremarkable. No free intraperitoneal gas or fluid. Lymphatic: No pathologic adenopathy within the abdomen and pelvis. Reproductive: Uterus and bilateral adnexa are unremarkable. Other: No abdominal wall hernia. Musculoskeletal: Degenerative changes are seen within the a visualized thoracolumbar spine with grade 1 anterolisthesis L4-5. No acute bone abnormality. No lytic or blastic bone lesion. IMPRESSION: 1. Large volume active hemorrhage involving the second portion of the duodenum with active extravasation arising from the mid gastroduodenal artery. 2. Moderate right coronary artery calcification. 3. Severe sigmoid diverticulosis without superimposed acute inflammatory change. Aortic Atherosclerosis (ICD10-I70.0). Electronically Signed: By: Helyn Numbers M.D. On: 04/22/2023 23:02   [place] EKG SITE RITE  Result Date: 04/22/2023 If Site Rite image not attached, placement could not be confirmed due to current cardiac rhythm.  US RENAL  Result Date: 04/22/2023 CLINICAL DATA:  AKI EXAM: RENAL / URINARY TRACT ULTRASOUND COMPLETE COMPARISON:  CT abdomen and pelvis with and without contrast July second 2015 FINDINGS: Right Kidney: Renal measurements: 9.4 x 3.9 x 4.2 cm = volume: 80.8 mL. Echogenicity within normal limits. No mass or  hydronephrosis visualized. Left Kidney: Renal measurements: 8.9 x 4.4 x 5.8 cm = volume: 117.9 mL. Echogenicity within normal limits. No mass or hydronephrosis visualized. Bladder: Decompressed with Foley catheter in place. Other: None. IMPRESSION: No acute findings. Electronically Signed   By: Jacob Moores M.D.   On: 04/22/2023 11:56      Assessment and Plan:  72 year old female with a history of hypertension, obesity anxiety/depression who was admitted to the ICU May 29 with altered mental status and lethargy, found to have a hemoglobin of 4, lactic acid of nearly 9, acute kidney injury with creatinine 3 and BUN 100 and WBC 55K.  Initially without overt bleeding for first 24 hours, but then experienced massive UGIB arising from the gastroduodenal artery on the evening of May 30th, s/p IR coil embolization with significant improvement in bleeding.  She likely has some ongoing mild bleeding based on the report of the stool's appearance and the slight drop in hemoglobin, but her pressor requirement has been minimal and her BUN has continued to downtrend.  I would not recommend an upper endoscopy at this time unless to treat significant hemorrhage.  As previously discussed, endoscopic therapies would likely be limited to temporizing therapies such as hemostatic sprays or gels, based on the expected size of the bleeding lesion.  Ok with extubation today if otherwise indicated.  Continue to treat medically with PPI.  Transfuse for Hgb <7.  Massive UGIB, suspect secondary to NSAID-induced duodenal ulcer, although underlying malignancy also possible - Plan for diagnostic EGD, likely next week - GI will be available for attempt at endoscopic hemostasis earlier, if needed - Continue IV PPI - Continue NPO for now - No objections to extubation today - Goal hgb 7   Leukocytosis/thrombocytopenia - Agree with concerns for possible underlying malignancy - Currently no infectious source identified, but  agree with continued empiric antibiotics given her critical illness and unclear etiology   Jenel Lucks, MD  04/24/2023, 9:03 AM [facility]

## 2023-04-24 NOTE — Progress Notes (Signed)
eLink Physician-Brief Progress Note Patient Name: Nicole Peters DOB: [birth year] MRN: [id number]   Date of Service  04/24/2023  HPI/Events of Note  72 year old female that initially presented with acute blood loss with hemoglobin of 4.4, likely duodenal bleeding.  Extubated 6/1.  Now hyperglycemic with CBG 49.  eICU Interventions  Regular hypoglycemia protocol initiated  Add D10 at 20cc/hr     Intervention Category Intermediate Interventions: Hyperglycemia - evaluation and treatment  Ion Gonnella 04/24/2023, 8:11 PM

## 2023-04-25 DIAGNOSIS — G9340 Encephalopathy, unspecified: Secondary | ICD-10-CM | POA: Diagnosis not present

## 2023-04-25 DIAGNOSIS — G9341 Metabolic encephalopathy: Secondary | ICD-10-CM

## 2023-04-25 DIAGNOSIS — R578 Other shock: Secondary | ICD-10-CM | POA: Diagnosis not present

## 2023-04-25 DIAGNOSIS — D62 Acute posthemorrhagic anemia: Secondary | ICD-10-CM | POA: Diagnosis not present

## 2023-04-25 DIAGNOSIS — K264 Chronic or unspecified duodenal ulcer with hemorrhage: Secondary | ICD-10-CM | POA: Diagnosis not present

## 2023-04-25 LAB — CBC WITH DIFFERENTIAL/PLATELET
Abs Immature Granulocytes: 0.8 10*3/uL — ABNORMAL HIGH (ref 0.00–0.07)
Band Neutrophils: 1 %
Basophils Absolute: 0 10*3/uL (ref 0.0–0.1)
Basophils Relative: 0 %
Eosinophils Absolute: 1.8 10*3/uL — ABNORMAL HIGH (ref 0.0–0.5)
Eosinophils Relative: 7 %
HCT: 21.8 % — ABNORMAL LOW (ref 36.0–46.0)
Hemoglobin: 7 g/dL — ABNORMAL LOW (ref 12.0–15.0)
Lymphocytes Relative: 7 %
Lymphs Abs: 1.8 10*3/uL (ref 0.7–4.0)
MCH: 31 pg (ref 26.0–34.0)
MCHC: 32.1 g/dL (ref 30.0–36.0)
MCV: 96.5 fL (ref 80.0–100.0)
Metamyelocytes Relative: 1 %
Monocytes Absolute: 0.5 10*3/uL (ref 0.1–1.0)
Monocytes Relative: 2 %
Myelocytes: 2 %
Neutro Abs: 20.3 10*3/uL — ABNORMAL HIGH (ref 1.7–7.7)
Neutrophils Relative %: 80 %
Platelets: 81 10*3/uL — ABNORMAL LOW (ref 150–400)
RBC: 2.26 MIL/uL — ABNORMAL LOW (ref 3.87–5.11)
RDW: 17.2 % — ABNORMAL HIGH (ref 11.5–15.5)
WBC: 25 10*3/uL — ABNORMAL HIGH (ref 4.0–10.5)
nRBC: 4.4 % — ABNORMAL HIGH (ref 0.0–0.2)

## 2023-04-25 LAB — BPAM RBC
Blood Product Expiration Date: 202406202359
Blood Product Expiration Date: 202406222359
Unit Type and Rh: 6200
Unit Type and Rh: 6200
Unit Type and Rh: 6200

## 2023-04-25 LAB — BASIC METABOLIC PANEL
Anion gap: 3 — ABNORMAL LOW (ref 5–15)
BUN: 26 mg/dL — ABNORMAL HIGH (ref 8–23)
CO2: 24 mmol/L (ref 22–32)
Calcium: 7.6 mg/dL — ABNORMAL LOW (ref 8.9–10.3)
Chloride: 112 mmol/L — ABNORMAL HIGH (ref 98–111)
Creatinine, Ser: 0.91 mg/dL (ref 0.44–1.00)
GFR, Estimated: >60 mL/min (ref >60)
Glucose, Bld: 77 mg/dL (ref 70–99)
Potassium: 3.7 mmol/L (ref 3.5–5.1)
Sodium: 139 mmol/L (ref 135–145)

## 2023-04-25 LAB — GLUCOSE, CAPILLARY
Glucose-Capillary: 102 mg/dL — ABNORMAL HIGH (ref 70–99)
Glucose-Capillary: 179 mg/dL — ABNORMAL HIGH (ref 70–99)
Glucose-Capillary: 24 mg/dL — CL (ref 70–99)
Glucose-Capillary: 40 mg/dL — CL (ref 70–99)
Glucose-Capillary: 49 mg/dL — ABNORMAL LOW (ref 70–99)
Glucose-Capillary: 69 mg/dL — ABNORMAL LOW (ref 70–99)
Glucose-Capillary: 70 mg/dL (ref 70–99)
Glucose-Capillary: 81 mg/dL (ref 70–99)
Glucose-Capillary: 89 mg/dL (ref 70–99)

## 2023-04-25 LAB — MAGNESIUM: Magnesium: 2 mg/dL (ref 1.7–2.4)

## 2023-04-25 LAB — PHOSPHORUS: Phosphorus: 2.3 mg/dL — ABNORMAL LOW (ref 2.5–4.6)

## 2023-04-25 LAB — TYPE AND SCREEN
ABO/RH(D): A POS
ABO/RH(D): A POS
Unit division: 0

## 2023-04-25 LAB — HAPTOGLOBIN: Haptoglobin: 91 mg/dL (ref 42–346)

## 2023-04-25 LAB — HEMOGLOBIN AND HEMATOCRIT, BLOOD
HCT: 21 % — ABNORMAL LOW (ref 36.0–46.0)
Hemoglobin: 6.7 g/dL — CL (ref 12.0–15.0)

## 2023-04-25 LAB — CULTURE, BLOOD (ROUTINE X 2)

## 2023-04-25 LAB — PREPARE RBC (CROSSMATCH)

## 2023-04-25 MED ORDER — DEXTROSE 50 % IV SOLN
INTRAVENOUS | Status: AC
  Filled 2023-04-25: qty 50

## 2023-04-25 MED ORDER — POTASSIUM PHOSPHATES 15 MMOLE/5ML IV SOLN
15.0000 mmol | Freq: Once | INTRAVENOUS | Status: AC
  Administered 2023-04-25: 15 mmol via INTRAVENOUS
  Filled 2023-04-25: qty 5

## 2023-04-25 MED ORDER — POTASSIUM CHLORIDE 10 MEQ/50ML IV SOLN
10.0000 meq | INTRAVENOUS | Status: DC
  Filled 2023-04-25: qty 50

## 2023-04-25 MED ORDER — POTASSIUM CHLORIDE 10 MEQ/50ML IV SOLN
10.0000 meq | INTRAVENOUS | Status: AC
  Administered 2023-04-25 (×2): 10 meq via INTRAVENOUS
  Filled 2023-04-25 (×2): qty 50

## 2023-04-25 MED ORDER — ORAL CARE MOUTH RINSE: 15.0000 mL | OROMUCOSAL | Status: DC | PRN

## 2023-04-25 MED ORDER — ORAL CARE MOUTH RINSE
15.0000 mL | OROMUCOSAL | Status: DC
  Administered 2023-04-25 – 2023-05-05 (×33): 15 mL via OROMUCOSAL

## 2023-04-25 MED ORDER — LABETALOL HCL 5 MG/ML IV SOLN: 10.0000 mg | INTRAVENOUS | Status: DC | PRN

## 2023-04-25 MED ORDER — PIPERACILLIN-TAZOBACTAM 3.375 G IVPB 30 MIN
3.3750 g | INTRAVENOUS | Status: AC
  Administered 2023-04-25: 3.375 g via INTRAVENOUS
  Filled 2023-04-25: qty 50

## 2023-04-25 MED ORDER — PIPERACILLIN-TAZOBACTAM 3.375 G IVPB
3.3750 g | Freq: Three times a day (TID) | INTRAVENOUS | Status: DC
  Administered 2023-04-25 – 2023-04-27 (×6): 3.375 g via INTRAVENOUS
  Filled 2023-04-25 (×6): qty 50

## 2023-04-25 MED ORDER — SODIUM CHLORIDE 0.9% IV SOLUTION: Freq: Once | INTRAVENOUS | Status: DC

## 2023-04-25 NOTE — Progress Notes (Signed)
NAME:  Nicole Peters, MRN:  [id number], DOB:  11-17-1951, LOS: 4 ADMISSION DATE:  04/21/2023, CONSULTATION DATE:  5/29 REFERRING MD:  Dr. Wilkie Aye, CHIEF COMPLAINT: Syncope  History of Present Illness:  72 yo female former smoker presented to [facility] ER with AMS after falling at home and striking her face 3 days prior to admission.  Had decreased appetite with poor fluid intake, and tarry stools.  CT head/face/neck were negative.  Found to have Na 128, CO2 15, BUN 115, Creatinine 3.05, lactic acid 8.8, WBC 55.6, Hb 4.4.  PCCM asked to admit to ICU in the setting of renal failure, acidosis, and symptomatic anemia.   Pertinent  Medical History  Allergies, Anxiety, OA, Depression, HLD, HTN, Osteoporosis, Stress incontinence  Significant Hospital Events: Including procedures, antibiotic start and stop dates in addition to other pertinent events   5/29 admitted after AMS/ syncope, hgb 4.4 5/30 started on low dose pressors, more confused, getting add'l PRBC. Sent for CTA GI after a lot of pushback, after the pt went from foul smears during the day to brisk melena abruptly.  Transient A fib. 5/31 GDA embo. Intubated for this and remains so after. Ongoing product resucs.   6/01 extubated, off pressors  Studies:  Renal u/s 5/30 >> no acute findings CT angio GI bleed 5/30 >> active extravasation from mid-gastroduodenal artery, coronary calcification, small HH, active hemorrhage from 2nd portion of duodenum, severe sigmoid diverticulosis  Interim History / Subjective:  Became confused overnight and had mitts but on.  Objective   Blood pressure (!) 168/62, pulse (!) 101, temperature 98.2 F (36.8 C), temperature source Axillary, resp. rate 19, height 5\' 4"  (1.626 m), weight 103.8 kg, SpO2 99 %.    Vent Mode: PSV FiO2 (%):  [40 %] 40 % Set Rate:  [20 bmp] 20 bmp Vt Set:  [430 mL] 430 mL PEEP:  [5 cmH20] 5 cmH20 Pressure Support:  [5 cmH20] 5 cmH20 Plateau Pressure:  [17 cmH20] 17 cmH20    Intake/Output Summary (Last 24 hours) at 04/25/2023 7829 Last data filed at 04/25/2023 0600 Gross per 24 hour  Intake 1298.57 ml  Output 1375 ml  Net -76.43 ml   Filed Weights   04/23/23 0700 04/24/23 0348 04/25/23 0500  Weight: 98.8 kg 103.8 kg 103.8 kg    Physical Exam:  General - sleepy Eyes - pupils reactive ENT - no sinus tenderness, no stridor Cardiac - regular rate/rhythm, no murmur Chest - equal breath sounds b/l, no wheezing or rales Abdomen - soft, non tender, + bowel sounds Extremities - no cyanosis, clubbing, or edema Skin - no rashes Neuro - mumbles few words, moves extremities, not consistently following commands  Resolved Hospital Problem list   Hyponatermia, Lactic acidosis, Hyperkalemia, AKI from ATN 2nd to sepsis/NSAIDS/Hypovolemia/ACE inhibitor, Elevated LFT's from shock, Compromised airway, Hypotension from GI bleeding/hypovolemia/sedation/sepsis, Hyperglycemia  Assessment & Plan:   Upper GI bleeding most likely from NSAID induced duodenal ulcer. - continue protonix - s/p celiac angiogram with gastroduodenal artery embolization by IR on 5/31 - GI planning repeat EGD prior to discharge, or sooner if she bleeds again  Acute blood loss anemia. - f/u CBC - transfuse for Hb < 7  Thrombocytopenia. - likely from consumption in setting of GI bleeding, uremia, and sepsis - f/u CBC  Leukocytosis. - concern for sepsis, but source unclear - C diff and GI PCR negative - f/u blood cultures from 5/29 - day 5 of Abx, change to zosyn  Acute metabolic encephalopathy 2nd to  renal failure, acidosis, sepsis. Hx of anxiety, depression. Agitated delirium developed 6/02. - reorient as needed - safety hand mitts - will change Abx from cefepime - hold outpt xanax  Paroxysmal atrial fibrillation. Hx of HTN, HLD. - in setting of acute stress - in sinus rhythm - monitor on telemetry - prn labetalol for goal SBP < 150 - hold outpt norvasc, lotensin, zocor while  NPO  Hx of allergic rhinitis. - hold outpt singulair, zyrtec while NPO  Hx of hypothyroidism. - resume synthroid when able to take pills  Hypoglycemia. - developed 6/02 - D10 in IV fluid - monitor CBG  Best Practice (right click and "Reselect all SmartList Selections" daily)   Diet/type: NPO DVT prophylaxis: SCD GI prophylaxis: PPI  Lines: Central line (Rt PICC) Foley:  Yes, and it is still needed Code Status:  full code Last date of multidisciplinary goals of care discussion [updated her husband at bedside 6/01]  Labs       Latest Ref Rng & Units 04/25/2023    4:14 AM 04/24/2023    4:06 AM 04/23/2023    6:12 PM  CMP  Glucose 70 - 99 mg/dL 77  161  096   BUN 8 - 23 mg/dL 26  42  51   Creatinine 0.44 - 1.00 mg/dL 0.45  4.09  8.11   Sodium 135 - 145 mmol/L 139  137  136   Potassium 3.5 - 5.1 mmol/L 3.7  3.9  4.0   Chloride 98 - 111 mmol/L 112  110  109   CO2 22 - 32 mmol/L 24  23  22    Calcium 8.9 - 10.3 mg/dL 7.6  7.5  7.7   Total Protein 6.5 - 8.1 g/dL  3.7    Total Bilirubin 0.3 - 1.2 mg/dL  0.8    Alkaline Phos 38 - 126 U/L  28    AST 15 - 41 U/L  124    ALT 0 - 44 U/L  226         Latest Ref Rng & Units 04/25/2023    4:14 AM 04/24/2023    8:53 AM 04/24/2023    6:28 AM  CBC  WBC 4.0 - 10.5 K/uL 25.0     Hemoglobin 12.0 - 15.0 g/dL 7.0  7.7  7.6   Hematocrit 36.0 - 46.0 % 21.8  23.7  23.0   Platelets 150 - 400 K/uL 81       ABG    Component Value Date/Time   PHART 7.29 (L) 04/23/2023 0505   PCO2ART 40 04/23/2023 0505   PO2ART 397 (H) 04/23/2023 0505   HCO3 19.2 (L) 04/23/2023 0505   ACIDBASEDEF 7.0 (H) 04/23/2023 0505   O2SAT 100 04/23/2023 0505    CBG (last 3)  Recent Labs    04/24/23 1540 04/24/23 1939 04/24/23 2011  GLUCAP 87 49* 93    Critical care time: 38 minutes  Coralyn Helling, MD [place] [facility]/Critical [facility] Pager - [phone] or [phone] 04/25/2023, 8:33 AM

## 2023-04-25 NOTE — Progress Notes (Signed)
eLink Physician-Brief Progress Note Patient Name: Nicole Peters DOB: 1951-08-31 MRN: [id number]   Date of Service  04/25/2023  HPI/Events of Note  72 year old female that initially presented with acute blood loss with hemoglobin of 4.4, likely duodenal bleeding. Extubated 6/1.   Persistent hypoglycemia while on D10 drip.  Hemoglobin 6.7, blood bank sample expired  eICU Interventions  Repeat type and screen  Increase D10 to 40 cc   0043 - Persistent hypoglycemia despite increasing D10.  Administered D50 ampule x 1 and increase the rate of D10 to 80.  Schedule BMP every 8 hours.  Discontinue LR.     Isabelle Matt 04/25/2023, 9:31 PM

## 2023-04-25 NOTE — Progress Notes (Signed)
Pharmacy Electrolyte Replacement  Recent Labs:  Recent Labs    04/25/23 0414  K 3.7  MG 2.0  PHOS 2.3*  CREATININE 0.91    Low Critical Values (K </= 2.5, Phos </= 1, Mg </= 1) Present: None  MD Contacted: N/A  Plan:  KCl 10 mEq IV q1h x 2 K-Phos 15 mmol IV x 1 Recheck BMET and phosphorus level with AM labs   Thank you for allowing pharmacy to be a part of this patient's care.  Selinda Eon, PharmD, BCPS Clinical Pharmacist [place] Please utilize Amion for appropriate phone number to reach the unit pharmacist [facility]) 04/25/2023 9:09 AM

## 2023-04-25 NOTE — Progress Notes (Signed)
Pharmacy Antibiotic Note  Nicole Peters is a 72 y.o. female admitted on 04/21/2023 with sepsis and initially started on cefepime, Flagyl, and vancomycin.  Vancomycin stopped 5/30.    Today, patient extubated and off pressors.  WBC trending down but remains elevated.  Antibiotics changed to Zosyn per Pharmacy consult.  Plan: Zosyn 3.375g IV x 1 over 30 minutes then  Zosyn 3.375g IV q8h (4 hour infusion). No dose adjustments anticipated for Zosyn and Pharmacy will sign off consult but continue to monitor clinical progress, renal function, and LOT.  Height: 5\' 4"  (162.6 cm) Weight: 103.8 kg (228 lb 14.8 oz) IBW/kg (Calculated) : 54.7  Temp (24hrs), Avg:98.1 F (36.7 C), Min:97.8 F (36.6 C), Max:98.6 F (37 C)  Recent Labs  Lab 04/21/23 1845 04/21/23 2149 04/22/23 0154 04/22/23 0535 04/22/23 1631 04/23/23 0425 04/23/23 0753 04/23/23 1812 04/24/23 0406 04/25/23 0414  WBC 55.6*  --   --  48.9* 43.6* 41.6*  --  41.6*  --  25.0*  CREATININE 3.05*  --   --  2.95*  --   --  1.48* 1.14* 1.09* 0.91  LATICACIDVEN 8.8* 8.9* 8.5* 3.1*  --   --   --   --   --   --     Estimated Creatinine Clearance: 65.5 mL/min (by C-G formula based on SCr of 0.91 mg/dL).    No Known Allergies  Antimicrobials this admission: 5/29 cefepime and Flagyl >> 6/2 5/29 vanc >> 5/30  Microbiology results: 5/29 BCx: ngtd 5/29 UCx: ngf  5/30 Cdiff: neg  Thank you for allowing pharmacy to be a part of this patient's care.  Selinda Eon, PharmD, BCPS Clinical Pharmacist [place] Please utilize Amion for appropriate phone number to reach the unit pharmacist [facility]) 04/25/2023 8:56 AM

## 2023-04-25 NOTE — Progress Notes (Signed)
[facility] ROUNDING NOTE   Subjective: Patient extubated yesterday and her pressors were discontinued.  She has remained hemodynamically stable.  Per nursing, she had a small brown bowel movement earlier this morning.  Her hemoglobin has drifted slightly to 7 this morning.  Her BUN has continued to drop steadily, now 26.  Her white count has improved significantly.  Thrombocytopenia stable. Patient was somnolent but arousable this morning.  She was able to follow simple commands, and was oriented to self.  Her speech was very difficult to comprehend, but she seemed quite confused and disoriented.  She was not agitated.   Objective: Vital signs in last 24 hours: Temp:  [97.8 F (36.6 C)-98.6 F (37 C)] 98.2 F (36.8 C) (06/02 0400) Pulse Rate:  [63-112] 101 (06/02 0500) Resp:  [12-24] 19 (06/02 0500) BP: (73-168)/(29-141) 168/62 (06/02 0400) SpO2:  [84 %-100 %] 99 % (06/02 0500) FiO2 (%):  [40 %] 40 % (06/01 1355) Weight:  [103.8 kg] 103.8 kg (06/02 0500) Last BM Date : 04/24/23 General: NAD ill-appearing obese Caucasian female Lungs:  CTA b/l, no w/r/r Heart:  RRR, no m/r/g Abdomen:  Soft, NT, ND, +BS Ext: Upper and lower extremity edema, extensive bruising    Intake/Output from previous day: 06/01 0701 - 06/02 0700 In: 1448.9 [I.V.:1252.4; IV Piggyback:196.5] Out: 1425 [Urine:1425] Intake/Output this shift: No intake/output data recorded.   Lab Results: Recent Labs    04/23/23 0425 04/23/23 1208 04/23/23 1812 04/23/23 2142 04/23/23 2143 04/24/23 0405 04/24/23 0406 04/24/23 0628 04/24/23 0853 04/25/23 0414  WBC 41.6*  --  41.6*  --   --   --   --   --   --  25.0*  HGB 6.9*   < > 8.2*   < >  --    < >  --  7.6* 7.7* 7.0*  PLT 104*   < > 92*  92*  --  87*  --  89*  --   --  81*  MCV 95.5  --  90.9  --   --   --   --   --   --  96.5   < > = values in this interval not displayed.   BMET Recent Labs    04/23/23 1812 04/24/23 0406 04/25/23 0414   NA 136 137 139  K 4.0 3.9 3.7  CL 109 110 112*  CO2 22 23 24   GLUCOSE 139* 127* 77  BUN 51* 42* 26*  CREATININE 1.14* 1.09* 0.91  CALCIUM 7.7* 7.5* 7.6*   LFT Recent Labs    04/22/23 1631 04/23/23 0753 04/24/23 0406  PROT  --  3.7* 3.7*  ALBUMIN  --  2.0* 2.0*  AST  --  241* 124*  ALT  --  333* 226*  ALKPHOS  --  25* 28*  BILITOT 1.8* 1.2 0.8  BILIDIR 0.5*  --  0.3*  IBILI 1.3*  --  0.5   PT/INR Recent Labs    04/23/23 2143 04/24/23 0406  INR 1.2 1.3*      Imaging/Other results: No results found.    Assessment and Plan: 72 year old female with a history of hypertension, obesity anxiety/depression who was admitted to the ICU May 29 with altered mental status and lethargy, found to have a hemoglobin of 4, lactic acid of nearly 9, acute kidney injury with creatinine 3 and BUN 100 and WBC 55K.  Initially without overt bleeding for first 24 hours, but then experienced massive UGIB arising from the gastroduodenal artery on the  evening of May 30th, s/p IR coil embolization with significant improvement in bleeding.   Her bleeding appears to have slowed significantly, if not stopped altogether.  Will still plan for diagnostic upper endoscopy at some point during this hospitalization prior to discharge.  I think she is probably okay to advance to a clear liquid diet today.    Massive UGIB, suspect secondary to NSAID-induced duodenal ulcer, although underlying malignancy also possible - Plan for diagnostic EGD, likely next week - GI will be available for attempt at endoscopic hemostasis earlier, if needed - Continue IV PPI -Okay to advance to clear liquid diet from GI standpoint -Transfuse for hemoglobin less than 7   Leukocytosis/thrombocytopenia - Agree with concerns for possible underlying malignancy, although significant improvement in the last 24 hours certainly makes malignancy much less likely than a reactive process - No evidence of cirrhosis/portal hypertension  on CT as possible cause of thrombocytopenia - Currently no infectious source identified, but agree with continued empiric antibiotics given her critical illness and unclear etiology  Dr. Chales Abrahams will be assuming inpatient GI care on Monday   Jenel Lucks, MD  04/25/2023, 9:31 AM [facility]

## 2023-04-26 ENCOUNTER — Inpatient Hospital Stay (HOSPITAL_COMMUNITY): Payer: Medicare Other

## 2023-04-26 DIAGNOSIS — D62 Acute posthemorrhagic anemia: Secondary | ICD-10-CM | POA: Diagnosis not present

## 2023-04-26 LAB — BASIC METABOLIC PANEL
Anion gap: 3 — ABNORMAL LOW (ref 5–15)
Anion gap: 4 — ABNORMAL LOW (ref 5–15)
BUN: 15 mg/dL (ref 8–23)
BUN: 16 mg/dL (ref 8–23)
CO2: 25 mmol/L (ref 22–32)
CO2: 25 mmol/L (ref 22–32)
Calcium: 7.2 mg/dL — ABNORMAL LOW (ref 8.9–10.3)
Calcium: 7.3 mg/dL — ABNORMAL LOW (ref 8.9–10.3)
Chloride: 106 mmol/L (ref 98–111)
Chloride: 107 mmol/L (ref 98–111)
Creatinine, Ser: 0.88 mg/dL (ref 0.44–1.00)
Creatinine, Ser: 0.98 mg/dL (ref 0.44–1.00)
GFR, Estimated: >60 mL/min (ref >60)
GFR, Estimated: >60 mL/min (ref >60)
Glucose, Bld: 100 mg/dL — ABNORMAL HIGH (ref 70–99)
Glucose, Bld: 95 mg/dL (ref 70–99)
Potassium: 3.7 mmol/L (ref 3.5–5.1)
Potassium: 3.6 mmol/L (ref 3.5–5.1)
Sodium: 134 mmol/L — ABNORMAL LOW (ref 135–145)
Sodium: 136 mmol/L (ref 135–145)

## 2023-04-26 LAB — GLUCOSE, CAPILLARY
Glucose-Capillary: 112 mg/dL — ABNORMAL HIGH (ref 70–99)
Glucose-Capillary: 12 mg/dL — CL (ref 70–99)
Glucose-Capillary: 124 mg/dL — ABNORMAL HIGH (ref 70–99)
Glucose-Capillary: 68 mg/dL — ABNORMAL LOW (ref 70–99)
Glucose-Capillary: 85 mg/dL (ref 70–99)
Glucose-Capillary: 87 mg/dL (ref 70–99)
Glucose-Capillary: 92 mg/dL (ref 70–99)
Glucose-Capillary: 93 mg/dL (ref 70–99)
Glucose-Capillary: <10 mg/dL — CL (ref 70–99)

## 2023-04-26 LAB — HEPATIC FUNCTION PANEL
ALT: 128 U/L — ABNORMAL HIGH (ref 0–44)
AST: 64 U/L — ABNORMAL HIGH (ref 15–41)
Albumin: 1.8 g/dL — ABNORMAL LOW (ref 3.5–5.0)
Alkaline Phosphatase: 24 U/L — ABNORMAL LOW (ref 38–126)
Bilirubin, Direct: 0.3 mg/dL — ABNORMAL HIGH (ref 0.0–0.2)
Indirect Bilirubin: 1 mg/dL — ABNORMAL HIGH (ref 0.3–0.9)
Total Bilirubin: 1.3 mg/dL — ABNORMAL HIGH (ref 0.3–1.2)
Total Protein: 3.6 g/dL — ABNORMAL LOW (ref 6.5–8.1)

## 2023-04-26 LAB — BPAM RBC
Blood Product Expiration Date: 202406222359
ISSUE DATE / TIME: 202406022225
Unit Type and Rh: 6200

## 2023-04-26 LAB — TYPE AND SCREEN
Antibody Screen: NEGATIVE
Unit division: 0

## 2023-04-26 LAB — CBC
HCT: 23.1 % — ABNORMAL LOW (ref 36.0–46.0)
Hemoglobin: 7.5 g/dL — ABNORMAL LOW (ref 12.0–15.0)
MCH: 31.4 pg (ref 26.0–34.0)
MCHC: 32.5 g/dL (ref 30.0–36.0)
MCV: 96.7 fL (ref 80.0–100.0)
Platelets: 88 10*3/uL — ABNORMAL LOW (ref 150–400)
RBC: 2.39 MIL/uL — ABNORMAL LOW (ref 3.87–5.11)
RDW: 17.6 % — ABNORMAL HIGH (ref 11.5–15.5)
WBC: 19 10*3/uL — ABNORMAL HIGH (ref 4.0–10.5)
nRBC: 1.3 % — ABNORMAL HIGH (ref 0.0–0.2)

## 2023-04-26 LAB — CULTURE, BLOOD (ROUTINE X 2)
Culture: NO GROWTH
Culture: NO GROWTH

## 2023-04-26 LAB — URINALYSIS, COMPLETE (UACMP) WITH MICROSCOPIC
Bacteria, UA: NONE SEEN
Bilirubin Urine: NEGATIVE
Glucose, UA: NEGATIVE mg/dL
Ketones, ur: NEGATIVE mg/dL
Leukocytes,Ua: NEGATIVE
Nitrite: NEGATIVE
Protein, ur: NEGATIVE mg/dL
Specific Gravity, Urine: 1.006 (ref 1.005–1.030)
pH: 5 (ref 5.0–8.0)

## 2023-04-26 LAB — BLOOD GAS, VENOUS
Acid-Base Excess: 8.9 mmol/L — ABNORMAL HIGH (ref 0.0–2.0)
Bicarbonate: 35.5 mmol/L — ABNORMAL HIGH (ref 20.0–28.0)
O2 Saturation: 63.6 %
Patient temperature: 37
pCO2, Ven: 56 mmHg (ref 44–60)
pH, Ven: 7.41 (ref 7.25–7.43)
pO2, Ven: 35 mmHg (ref 32–45)

## 2023-04-26 LAB — PROCALCITONIN: Procalcitonin: 0.46 ng/mL

## 2023-04-26 LAB — HEMOGLOBIN A1C
Hgb A1c MFr Bld: 5.3 % (ref 4.8–5.6)
Mean Plasma Glucose: 105 mg/dL

## 2023-04-26 LAB — PHOSPHORUS: Phosphorus: 2.5 mg/dL (ref 2.5–4.6)

## 2023-04-26 LAB — TSH: TSH: 15.847 u[IU]/mL — ABNORMAL HIGH (ref 0.350–4.500)

## 2023-04-26 LAB — VITAMIN B12: Vitamin B-12: 647 pg/mL (ref 180–914)

## 2023-04-26 MED ORDER — LORATADINE 10 MG PO TABS
10.0000 mg | ORAL_TABLET | Freq: Every day | ORAL | Status: DC
  Administered 2023-04-27 – 2023-05-05 (×8): 10 mg via ORAL
  Filled 2023-04-26 (×8): qty 1

## 2023-04-26 MED ORDER — MONTELUKAST SODIUM 10 MG PO TABS
10.0000 mg | ORAL_TABLET | Freq: Every day | ORAL | Status: DC
  Administered 2023-04-26 – 2023-05-04 (×9): 10 mg via ORAL
  Filled 2023-04-26 (×9): qty 1

## 2023-04-26 MED ORDER — DEXTROSE 50 % IV SOLN
1.0000 | Freq: Once | INTRAVENOUS | Status: AC
  Filled 2023-04-26: qty 50

## 2023-04-26 MED ORDER — FUROSEMIDE 10 MG/ML IJ SOLN
40.0000 mg | Freq: Once | INTRAMUSCULAR | Status: AC
  Administered 2023-04-26: 40 mg via INTRAVENOUS
  Filled 2023-04-26: qty 4

## 2023-04-26 MED ORDER — BOOST / RESOURCE BREEZE PO LIQD CUSTOM
1.0000 | Freq: Three times a day (TID) | ORAL | Status: DC
  Administered 2023-04-26 – 2023-04-28 (×4): 1 via ORAL

## 2023-04-26 MED ORDER — LEVOTHYROXINE SODIUM 50 MCG PO TABS
75.0000 ug | ORAL_TABLET | Freq: Every day | ORAL | Status: DC
  Administered 2023-04-27 – 2023-05-05 (×9): 75 ug via ORAL
  Filled 2023-04-26 (×8): qty 1

## 2023-04-26 NOTE — Inpatient Diabetes Management (Signed)
Inpatient Diabetes Program Recommendations  AACE/ADA: New Consensus Statement on Inpatient Glycemic Control   Target Ranges:  Prepandial:   less than 140 mg/dL      Peak postprandial:   less than 180 mg/dL (1-2 hours)      Critically ill patients:  140 - 180 mg/dL    Latest Reference Range & Units 04/26/23 00:26 04/26/23 00:34 04/26/23 05:42 04/26/23 07:55 04/26/23 08:05 04/26/23 11:31  Glucose-Capillary 70 - 99 mg/dL <16 (LL) 68 (L) 87 12 (LL) 92 93    Latest Reference Range & Units 04/26/23 05:00 04/26/23 08:03  Glucose 70 - 99 mg/dL 109 (H) 95    Latest Reference Range & Units 04/25/23 03:46 04/25/23 08:38 04/25/23 11:42 04/25/23 12:27 04/25/23 17:43 04/25/23 20:09 04/25/23 20:16  Glucose-Capillary 70 - 99 mg/dL 604 (H) 540 (H) 40 (LL) 89 81 49 (L) 70    Latest Reference Range & Units 04/25/23 04:15  Hemoglobin A1C 4.8 - 5.6 % 5.3   Review of Glycemic Control  Diabetes history: No Outpatient Diabetes medications: NA Current orders for Inpatient glycemic control: Novolog 0-20 units Q4H  Inpatient Diabetes Program Recommendations:    Insulin: Patient has not received any insulin since being admitted. Please consider discontinuing Novolog correction scale.  NOTE: Per chart, patient does not have a hx of DM and is having noted hypoglycemia. Patient is currently ordered D10 @ 80 ml/hr.  Thanks, Orlando Penner, RN, MSN, CDCES Diabetes Coordinator Inpatient Diabetes Program [phone] (Team Pager from 8am to 5pm)

## 2023-04-26 NOTE — Progress Notes (Signed)
NAME:  Nicole Peters, MRN:  [id number], DOB:  1951/02/13, LOS: 5 ADMISSION DATE:  04/21/2023, CONSULTATION DATE:  5/29 REFERRING MD:  Dr. Wilkie Aye, CHIEF COMPLAINT: Syncope  History of Present Illness:  72 yo female former smoker presented to [facility] ER with AMS after falling at home and striking her face 3 days prior to admission.  Had decreased appetite with poor fluid intake, and tarry stools.  CT head/face/neck were negative.  Found to have Na 128, CO2 15, BUN 115, Creatinine 3.05, lactic acid 8.8, WBC 55.6, Hb 4.4.  PCCM asked to admit to ICU in the setting of renal failure, acidosis, and symptomatic anemia.   Pertinent  Medical History  Allergies, Anxiety, OA, Depression, HLD, HTN, Osteoporosis, Stress incontinence  Significant Hospital Events: Including procedures, antibiotic start and stop dates in addition to other pertinent events   5/29 admitted after AMS/ syncope, hgb 4.4 5/30 started on low dose pressors, more confused, getting add'l PRBC. Sent for CTA GI after a lot of pushback, after the pt went from foul smears during the day to brisk melena abruptly.  Transient A fib. 5/31 GDA embo. Intubated for this and remains so after. Ongoing product resucs.   6/01 extubated, off pressors  Studies:  Renal u/s 5/30 >> no acute findings CT angio GI bleed 5/30 >> active extravasation from mid-gastroduodenal artery, coronary calcification, small HH, active hemorrhage from 2nd portion of duodenum, severe sigmoid diverticulosis  Interim History / Subjective:  Needing a lot of D10  More alert  Objective   Blood pressure (!) 180/160, pulse 78, temperature 98 F (36.7 C), temperature source Axillary, resp. rate (!) 22, height 5\' 4"  (1.626 m), weight 103.8 kg, SpO2 100 %.        Intake/Output Summary (Last 24 hours) at 04/26/2023 0814 Last data filed at 04/26/2023 0600 Gross per 24 hour  Intake 2375.98 ml  Output 1420 ml  Net 955.98 ml   Filed Weights   04/23/23 0700 04/24/23 0348  04/25/23 0500  Weight: 98.8 kg 103.8 kg 103.8 kg    Physical Exam: Drowsy but easily arousable, follows commands  Prominent upper airway wheeze, tachypneic Abdomen soft, nt Anasarcic, warm Very tender over BLE, L>R with scattered ecchymoses   Resolved Hospital Problem list   Hyponatermia, Lactic acidosis, Hyperkalemia, AKI from ATN 2nd to sepsis/NSAIDS/Hypovolemia/ACE inhibitor, Elevated LFT's from shock, Compromised airway, Hypotension from GI bleeding/hypovolemia/sedation/sepsis, Hyperglycemia  Assessment & Plan:   Upper GI bleeding most likely from NSAID induced duodenal ulcer. - continue protonix - s/p celiac angiogram with gastroduodenal artery embolization by IR on 5/31 - GI planning repeat EGD prior to discharge, or sooner if she bleeds again  Acute blood loss anemia. - trend CBC - transfuse for Hb < 7  Hypoxia Suspected sleep disordered breathing, possibly of extravascular volume overload - CXR - lasix IV 40 - wean nasal cannula for saturation 92%  Thrombocytopenia. - likely from consumption in setting of GI bleeding, uremia, and sepsis - trend CBC  Leukocytosis. - concern for sepsis, but source unclear - C diff and GI PCR negative - f/u blood cultures from 5/29 - day 5 of Abx, change to zosyn - repeat procalcitonin, likely dc ABX today  Acute metabolic encephalopathy 2nd to renal failure, acidosis, sepsis. Hx of anxiety, depression. Agitated delirium developed 6/02, improving - cefepime has been discontinued  - reorient as needed - safety hand mitts - hold outpt xanax, eventually will need to resume   BLE pain Mechanical fall - XR pelvis and  BLE  Paroxysmal atrial fibrillation. Hx of HTN, HLD. - in setting of acute stress - in sinus rhythm - monitor on telemetry - prn labetalol for goal SBP < 150 - hold outpt for now  Hx of allergic rhinitis. - resume outpt singulair, zyrtec for now  Hx of hypothyroidism. - resume synthroid    Hypoglycemia. - developed 6/02 - D10 in IV fluid, will dc as she resumes CLD - monitor CBG  Best Practice (right click and "Reselect all SmartList Selections" daily)   Diet/type: clear liquids DVT prophylaxis: SCD GI prophylaxis: PPI  Lines: Central line (Rt PICC) Foley:  Yes, and it is still needed - will remove later today after diuretic wears off Code Status:  full code Last date of multidisciplinary goals of care discussion [updated her husband at bedside 6/01, will reach out later today]  Labs       Latest Ref Rng & Units 04/26/2023    5:00 AM 04/25/2023    4:14 AM 04/24/2023    4:06 AM  CMP  Glucose 70 - 99 mg/dL 045  77  409   BUN 8 - 23 mg/dL 15  26  42   Creatinine 0.44 - 1.00 mg/dL 8.11  9.14  7.82   Sodium 135 - 145 mmol/L 134  139  137   Potassium 3.5 - 5.1 mmol/L 3.7  3.7  3.9   Chloride 98 - 111 mmol/L 106  112  110   CO2 22 - 32 mmol/L 25  24  23    Calcium 8.9 - 10.3 mg/dL 7.2  7.6  7.5   Total Protein 6.5 - 8.1 g/dL 3.6   3.7   Total Bilirubin 0.3 - 1.2 mg/dL 1.3   0.8   Alkaline Phos 38 - 126 U/L 24   28   AST 15 - 41 U/L 64   124   ALT 0 - 44 U/L 128   226        Latest Ref Rng & Units 04/26/2023    5:00 AM 04/25/2023    5:50 PM 04/25/2023    4:14 AM  CBC  WBC 4.0 - 10.5 K/uL 19.0   25.0   Hemoglobin 12.0 - 15.0 g/dL 7.5  6.7  7.0   Hematocrit 36.0 - 46.0 % 23.1  21.0  21.8   Platelets 150 - 400 K/uL 88   81     ABG    Component Value Date/Time   PHART 7.29 (L) 04/23/2023 0505   PCO2ART 40 04/23/2023 0505   PO2ART 397 (H) 04/23/2023 0505   HCO3 19.2 (L) 04/23/2023 0505   ACIDBASEDEF 7.0 (H) 04/23/2023 0505   O2SAT 100 04/23/2023 0505    CBG (last 3)  Recent Labs    04/26/23 0034 04/26/23 0542 04/26/23 0755  GLUCAP 68* 87 12*    Critical care time: na   Laroy [facility] Pulmonary/Critical Care  Pager - [phone] or [phone] 04/26/2023, 8:14 AM

## 2023-04-26 NOTE — Progress Notes (Signed)
Brief PCCM Progress Note  # Acute metabolic encephalopathy May be multifactorial. Cefepime exposure, mild hypercapnia even though pH WNL. Head trauma raises possibility of TBI, very low Hb raises possibility of anoxic injury. - resume home synthroid - remains off cefepime - if failing to clear tomorrow then consideration of MR brain to assess for TBI, anoxic injury  Nicole Peters Pulmonary/Critical Care

## 2023-04-27 DIAGNOSIS — D72829 Elevated white blood cell count, unspecified: Secondary | ICD-10-CM | POA: Diagnosis not present

## 2023-04-27 DIAGNOSIS — K922 Gastrointestinal hemorrhage, unspecified: Secondary | ICD-10-CM | POA: Diagnosis not present

## 2023-04-27 DIAGNOSIS — D62 Acute posthemorrhagic anemia: Secondary | ICD-10-CM | POA: Diagnosis not present

## 2023-04-27 LAB — GLUCOSE, CAPILLARY
Glucose-Capillary: 116 mg/dL — ABNORMAL HIGH (ref 70–99)
Glucose-Capillary: 118 mg/dL — ABNORMAL HIGH (ref 70–99)
Glucose-Capillary: 119 mg/dL — ABNORMAL HIGH (ref 70–99)
Glucose-Capillary: 146 mg/dL — ABNORMAL HIGH (ref 70–99)
Glucose-Capillary: 149 mg/dL — ABNORMAL HIGH (ref 70–99)
Glucose-Capillary: 80 mg/dL (ref 70–99)

## 2023-04-27 LAB — CBC
HCT: 23.3 % — ABNORMAL LOW (ref 36.0–46.0)
Hemoglobin: 7.5 g/dL — ABNORMAL LOW (ref 12.0–15.0)
MCH: 31.4 pg (ref 26.0–34.0)
MCHC: 32.2 g/dL (ref 30.0–36.0)
MCV: 97.5 fL (ref 80.0–100.0)
Platelets: 101 10*3/uL — ABNORMAL LOW (ref 150–400)
RBC: 2.39 MIL/uL — ABNORMAL LOW (ref 3.87–5.11)
RDW: 17.8 % — ABNORMAL HIGH (ref 11.5–15.5)
WBC: 14.9 10*3/uL — ABNORMAL HIGH (ref 4.0–10.5)
nRBC: 0.5 % — ABNORMAL HIGH (ref 0.0–0.2)

## 2023-04-27 LAB — BASIC METABOLIC PANEL
Anion gap: 6 (ref 5–15)
BUN: 12 mg/dL (ref 8–23)
CO2: 29 mmol/L (ref 22–32)
Calcium: 7.1 mg/dL — ABNORMAL LOW (ref 8.9–10.3)
Chloride: 101 mmol/L (ref 98–111)
Creatinine, Ser: 0.95 mg/dL (ref 0.44–1.00)
GFR, Estimated: >60 mL/min (ref >60)
Glucose, Bld: 117 mg/dL — ABNORMAL HIGH (ref 70–99)
Potassium: 3.1 mmol/L — ABNORMAL LOW (ref 3.5–5.1)
Sodium: 136 mmol/L (ref 135–145)

## 2023-04-27 LAB — T4, FREE: Free T4: 0.72 ng/dL (ref 0.61–1.12)

## 2023-04-27 LAB — MAGNESIUM: Magnesium: 1.7 mg/dL (ref 1.7–2.4)

## 2023-04-27 MED ORDER — MAGNESIUM SULFATE 2 GM/50ML IV SOLN
2.0000 g | Freq: Once | INTRAVENOUS | Status: AC
  Administered 2023-04-27: 2 g via INTRAVENOUS
  Filled 2023-04-27: qty 50

## 2023-04-27 MED ORDER — MIDODRINE HCL 2.5 MG PO TABS
2.5000 mg | ORAL_TABLET | Freq: Three times a day (TID) | ORAL | Status: DC
  Administered 2023-04-27 – 2023-04-30 (×9): 2.5 mg via ORAL
  Filled 2023-04-27 (×10): qty 1

## 2023-04-27 MED ORDER — POTASSIUM CHLORIDE CRYS ER 20 MEQ PO TBCR
20.0000 meq | EXTENDED_RELEASE_TABLET | ORAL | Status: AC
  Administered 2023-04-27 (×2): 20 meq via ORAL
  Filled 2023-04-27 (×2): qty 1

## 2023-04-27 MED ORDER — POTASSIUM CHLORIDE 10 MEQ/50ML IV SOLN
10.0000 meq | INTRAVENOUS | Status: AC
  Administered 2023-04-27 (×4): 10 meq via INTRAVENOUS
  Filled 2023-04-27 (×4): qty 50

## 2023-04-27 MED ORDER — FUROSEMIDE 10 MG/ML IJ SOLN
20.0000 mg | Freq: Once | INTRAMUSCULAR | Status: AC
  Administered 2023-04-27: 20 mg via INTRAVENOUS
  Filled 2023-04-27: qty 2

## 2023-04-27 NOTE — Progress Notes (Signed)
PROGRESS NOTE    Nicole Peters  WJX:[id number] DOB: 06/06/1951 DOA: 04/21/2023 PCP: Lezlie Lye, Meda Coffee, MD    Brief Narrative:  72 yo female former smoker presented to [facility] ER with AMS after falling at home and striking her face 3 days prior to admission. Had decreased appetite with poor fluid intake, and tarry stools. CT head/face/neck were negative. Found to have Na 128, CO2 15, BUN 115, Creatinine 3.05, lactic acid 8.8, WBC 55.6, Hb 4.4. PCCM asked to admit to ICU in the setting of renal failure, acidosis, and symptomatic anemia.  5/29 admitted after AMS/ syncope, hgb 4.4 5/30 started on low dose pressors, more confused, getting add'l PRBC. Sent for CTA GI after a lot of pushback, after the pt went from foul smears during the day to brisk melena abruptly.  Transient A fib. 5/31 GDA embo. Intubated for this and remains so after. Ongoing product resucs.   6/01 extubated, off pressors 6/4- transferred to [facility]   Assessment and Plan: pper GI bleeding most likely from NSAID induced duodenal ulcer. - continue protonix - s/p celiac angiogram with gastroduodenal artery embolization by IR on 5/31 - GI planning repeat EGD   Acute blood loss anemia. - trend CBC - transfuse for Hb < 7 or if trending down/for BP support   Hypoxia Suspected sleep disordered breathing, possibly of extravascular volume overload (+10L if I//Os are correct) -IV lasix PRN - wean nasal cannula for saturation 92%   Thrombocytopenia. - likely from consumption in setting of GI bleeding, uremia, and sepsis - trending up   Hypokalemia -replete  Leukocytosis. - source unclear - C diff and GI PCR negative - f/u blood cultures from 5/29 - finished course of abx   Acute metabolic encephalopathy 2nd to renal failure, acidosis, sepsis. Hx of anxiety, depression. Agitated delirium developed 6/02, improving - cefepime has been discontinued  - delirium precautions  - hold outpt xanax, eventually will need to  resume   -once stable: consideration of MR brain to assess for TBI, anoxic injury   BLE pain Mechanical fall - XR pelvis and BLE- negative   Paroxysmal atrial fibrillation. Hx of HTN, HLD. - in setting of acute stress - monitor on telemetry -unable to have blood thinner due to GI bleed   Hx of allergic rhinitis. - resume outpt singulair, zyrtec for now   Hx of hypothyroidism. - resume synthroid    Hypoglycemia. - developed 6/02 - D10 in IV fluid, will dc as she resumes CLD - monitor CBG   DVT prophylaxis: SCDs Start: 04/21/23 2120    Code Status: Full Code   Disposition Plan:  Level of care: ICU Status is: Inpatient Remains inpatient appropriate     Consultants:  PCCM GI    Subjective: Confused but denies any symptoms  Objective: Vitals:   04/27/23 0100 04/27/23 0400 04/27/23 0500 04/27/23 0600  BP: (!) 105/52 (!) 101/42 92/76 (!) 109/41  Pulse: 87 82 84 80  Resp: 17 20 (!) 22 (!) 22  Temp:  99.2 F (37.3 C)    TempSrc:  Axillary    SpO2: 95% 97% 97% 99%  Weight:      Height:        Intake/Output Summary (Last 24 hours) at 04/27/2023 0949 Last data filed at 04/27/2023 0857 Gross per 24 hour  Intake 1858.91 ml  Output 4625 ml  Net -2766.09 ml   Filed Weights   04/23/23 0700 04/24/23 0348 04/25/23 0500  Weight: 98.8 kg 103.8 kg 103.8 kg  Examination:   General: Appearance:    Obese female in no acute distress- bruising on face     Lungs:     On [place], diminished respirations unlabored  Heart:    Normal heart rate    MS:   All extremities are intact. + edema   Neurologic:   Awake, alert, only oriented to person       Data Reviewed: I have personally reviewed following labs and imaging studies  CBC: Recent Labs  Lab 04/22/23 0535 04/22/23 1203 04/23/23 0425 04/23/23 1208 04/23/23 1812 04/23/23 2142 04/23/23 2143 04/24/23 0405 04/24/23 0406 04/24/23 0628 04/24/23 0853 04/25/23 0414 04/25/23 1750 04/26/23 0500 04/27/23 0338   WBC 48.9*   < > 41.6*  --  41.6*  --   --   --   --   --   --  25.0*  --  19.0* 14.9*  NEUTROABS 39.6*  --   --   --   --   --   --   --   --   --   --  20.3*  --   --   --   HGB 6.7*   < > 6.9*   < > 8.2*   < >  --    < >  --    < > 7.7* 7.0* 6.7* 7.5* 7.5*  HCT 18.5*   < > 21.3*   < > 24.1*   < >  --    < >  --    < > 23.7* 21.8* 21.0* 23.1* 23.3*  MCV 86.4   < > 95.5  --  90.9  --   --   --   --   --   --  96.5  --  96.7 97.5  PLT 168   < > 104*   < > 92*  92*  --  87*  --  89*  --   --  81*  --  88* 101*   < > = values in this interval not displayed.   Basic Metabolic Panel: Recent Labs  Lab 04/22/23 0535 04/22/23 0915 04/23/23 0753 04/23/23 1812 04/24/23 0406 04/25/23 0414 04/26/23 0500 04/26/23 0803 04/27/23 0338  NA 131*  --  135   < > 137 139 134* 136 136  K 5.5*   < > 3.9   < > 3.9 3.7 3.7 3.6 3.1*  CL 104  --  109   < > 110 112* 106 107 101  CO2 18*  --  18*   < > 23 24 25 25 29   GLUCOSE 128*  --  176*   < > 127* 77 100* 95 117*  BUN 115*  --  65*   < > 42* 26* 15 16 12   CREATININE 2.95*  --  1.48*   < > 1.09* 0.91 0.88 0.98 0.95  CALCIUM 6.8*  --  7.2*   < > 7.5* 7.6* 7.2* 7.3* 7.1*  MG 1.6*  --  2.0  --   --  2.0  --   --  1.7  PHOS 5.5*  --  3.1  --   --  2.3* 2.5  --   --    < > = values in this interval not displayed.   GFR: Estimated Creatinine Clearance: 62.8 mL/min (by C-G formula based on SCr of 0.95 mg/dL). Liver Function Tests: Recent Labs  Lab 04/21/23 1845 04/22/23 1631 04/23/23 0753 04/24/23 0406 04/26/23 0500  AST 40  --  241* 124*  64*  ALT 21  --  333* 226* 128*  ALKPHOS 26*  --  25* 28* 24*  BILITOT 0.7 1.8* 1.2 0.8 1.3*  PROT 4.7*  --  3.7* 3.7* 3.6*  ALBUMIN 2.5*  --  2.0* 2.0* 1.8*   Recent Labs  Lab 04/21/23 1845  LIPASE 35   Recent Labs  Lab 04/24/23 0405  AMMONIA 20   Coagulation Profile: Recent Labs  Lab 04/23/23 0424 04/23/23 1208 04/23/23 1812 04/23/23 2143 04/24/23 0406  INR 1.4* 1.2 1.2 1.2 1.3*   Cardiac  Enzymes: No results for input(s): "CKTOTAL", "CKMB", "CKMBINDEX", "TROPONINI" in the last 168 hours. BNP (last 3 results) No results for input(s): "PROBNP" in the last 8760 hours. HbA1C: Recent Labs    04/25/23 0415  HGBA1C 5.3   CBG: Recent Labs  Lab 04/26/23 1531 04/26/23 1958 04/26/23 2340 04/27/23 0342 04/27/23 0735  GLUCAP 112* 124* 85 119* 118*   Lipid Profile: No results for input(s): "CHOL", "HDL", "LDLCALC", "TRIG", "CHOLHDL", "LDLDIRECT" in the last 72 hours. Thyroid Function Tests: Recent Labs    04/26/23 1535  TSH 15.847*   Anemia Panel: Recent Labs    04/26/23 1535  VITAMINB12 647   Sepsis Labs: Recent Labs  Lab 04/21/23 1845 04/21/23 2149 04/22/23 0154 04/22/23 0535 04/26/23 0803  PROCALCITON  --   --   --  0.65 0.46  LATICACIDVEN 8.8* 8.9* 8.5* 3.1*  --     Recent Results (from the past 240 hour(s))  Culture, blood (routine x 2)     Status: None   Collection Time: 04/21/23  6:45 PM   Specimen: Left Antecubital; Blood  Result Value Ref Range Status   Specimen Description   Final    LEFT ANTECUBITAL BLOOD Performed at [facility], [address] [place], [place] [address]    Special Requests   Final    BOTTLES DRAWN AEROBIC AND ANAEROBIC Blood Culture results may not be optimal due to an excessive volume of blood received in culture bottles Performed at [facility], [address], [place], [place] [address]    Culture   Final    NO GROWTH 5 DAYS Performed at [facility], [address], [place], [place] [address]    Report Status 04/26/2023 FINAL  Final  Resp panel by RT-PCR (RSV, Flu A&B, Covid) Anterior Nasal Swab     Status: None   Collection Time: 04/21/23  8:12 PM   Specimen: Anterior Nasal Swab  Result Value Ref Range Status   SARS Coronavirus 2 by RT PCR NEGATIVE NEGATIVE Final    Comment: (NOTE) SARS-CoV-2 target nucleic acids are NOT DETECTED.  The SARS-CoV-2 RNA is generally detectable  in upper respiratory specimens during the acute phase of infection. The lowest concentration of SARS-CoV-2 viral copies this assay can detect is 138 copies/mL. A negative result does not preclude SARS-Cov-2 infection and should not be used as the sole basis for treatment or other patient management decisions. A negative result may occur with  improper specimen collection/handling, submission of specimen other than nasopharyngeal swab, presence of viral mutation(s) within the areas targeted by this assay, and inadequate number of viral copies(<138 copies/mL). A negative result must be combined with clinical observations, patient history, and epidemiological information. The expected result is Negative.  Fact Sheet for Patients:  BloggerCourse.com  Fact Sheet for Healthcare Providers:  SeriousBroker.it  This test is no t yet approved or cleared by the [place] FDA and  has been  authorized for detection and/or diagnosis of SARS-CoV-2 by FDA under an Emergency Use Authorization (EUA). This EUA will remain  in effect (meaning this test can be used) for the duration of the COVID-19 declaration under Section 564(b)(1) of the Act, 21 U.S.C.section 360bbb-3(b)(1), unless the authorization is terminated  or revoked sooner.       Influenza A by PCR NEGATIVE NEGATIVE Final   Influenza B by PCR NEGATIVE NEGATIVE Final    Comment: (NOTE) The Xpert Xpress SARS-CoV-2/FLU/RSV plus assay is intended as an aid in the diagnosis of influenza from Nasopharyngeal swab specimens and should not be used as a sole basis for treatment. Nasal washings and aspirates are unacceptable for Xpert Xpress SARS-CoV-2/FLU/RSV testing.  Fact Sheet for Patients: BloggerCourse.com  Fact Sheet for Healthcare Providers: SeriousBroker.it  This test is not yet approved or cleared by the [place] FDA and has  been authorized for detection and/or diagnosis of SARS-CoV-2 by FDA under an Emergency Use Authorization (EUA). This EUA will remain in effect (meaning this test can be used) for the duration of the COVID-19 declaration under Section 564(b)(1) of the Act, 21 U.S.C. section 360bbb-3(b)(1), unless the authorization is terminated or revoked.     Resp Syncytial Virus by PCR NEGATIVE NEGATIVE Final    Comment: (NOTE) Fact Sheet for Patients: BloggerCourse.com  Fact Sheet for Healthcare Providers: SeriousBroker.it  This test is not yet approved or cleared by the [place] FDA and has been authorized for detection and/or diagnosis of SARS-CoV-2 by FDA under an Emergency Use Authorization (EUA). This EUA will remain in effect (meaning this test can be used) for the duration of the COVID-19 declaration under Section 564(b)(1) of the Act, 21 U.S.C. section 360bbb-3(b)(1), unless the authorization is terminated or revoked.  Performed at [facility], [address], [place], [place] [address]   Culture, blood (routine x 2)     Status: None   Collection Time: 04/21/23  8:19 PM   Specimen: BLOOD  Result Value Ref Range Status   Specimen Description   Final    BLOOD RIGHT ANTECUBITAL Performed at [facility], [address] [place], [place] [address]    Special Requests   Final    BOTTLES DRAWN AEROBIC AND ANAEROBIC Blood Culture adequate volume Performed at [facility], [address], [place], [place] [address]    Culture   Final    NO GROWTH 5 DAYS Performed at [facility], [address], [place], [place] [address]    Report Status 04/26/2023 FINAL  Final  Urine Culture (for pregnant, neutropenic or urologic patients or patients with an indwelling urinary catheter)     Status: None   Collection Time: 04/21/23  8:36 PM   Specimen: Urine, Clean Catch  Result Value  Ref Range Status   Specimen Description   Final    URINE, CLEAN CATCH Performed at [facility], [address], [place], [place] [address]    Special Requests   Final    NONE Performed at [facility], [address], [place], [place] [address]    Culture   Final    NO GROWTH Performed at [facility], [address], [place], [place] [address]    Report Status 04/22/2023 FINAL  Final  C Difficile Quick Screen w PCR reflex     Status: None   Collection Time: 04/22/23  4:48 PM   Specimen: STOOL  Result Value Ref Range  Status   C Diff antigen NEGATIVE NEGATIVE Final   C Diff toxin NEGATIVE NEGATIVE Final   C Diff interpretation No C. difficile detected.  Final    Comment: Performed at [facility], [address] [place], [place] [address]  Gastrointestinal Panel by PCR , Stool     Status: None   Collection Time: 04/22/23  7:29 PM   Specimen: Stool  Result Value Ref Range Status   Campylobacter species NOT DETECTED NOT DETECTED Final   Plesimonas shigelloides NOT DETECTED NOT DETECTED Final   Salmonella species NOT DETECTED NOT DETECTED Final   Yersinia enterocolitica NOT DETECTED NOT DETECTED Final   Vibrio species NOT DETECTED NOT DETECTED Final   Vibrio cholerae NOT DETECTED NOT DETECTED Final   Enteroaggregative E coli (EAEC) NOT DETECTED NOT DETECTED Final   Enteropathogenic E coli (EPEC) NOT DETECTED NOT DETECTED Final   Enterotoxigenic E coli (ETEC) NOT DETECTED NOT DETECTED Final   Shiga like toxin producing E coli (STEC) NOT DETECTED NOT DETECTED Final   Shigella/Enteroinvasive E coli (EIEC) NOT DETECTED NOT DETECTED Final   Cryptosporidium NOT DETECTED NOT DETECTED Final   Cyclospora cayetanensis NOT DETECTED NOT DETECTED Final   Entamoeba histolytica NOT DETECTED NOT DETECTED Final   Giardia lamblia NOT DETECTED NOT DETECTED Final   Adenovirus F40/41 NOT DETECTED NOT DETECTED Final   Astrovirus NOT  DETECTED NOT DETECTED Final   Norovirus GI/GII NOT DETECTED NOT DETECTED Final   Rotavirus A NOT DETECTED NOT DETECTED Final   Sapovirus (I, II, IV, and V) NOT DETECTED NOT DETECTED Final    Comment: Performed at [facility], [address] [place], [place] [address]         Radiology Studies: DG Knee 1-2 Views Right  Result Date: 04/26/2023 CLINICAL DATA:  Trauma, fall EXAM: RIGHT KNEE - 1-2 VIEW COMPARISON:  04/21/2023 FINDINGS: There is previous right knee arthroplasty. No recent fracture or dislocation is seen. There is no significant effusion in suprapatellar bursa. Scattered vascular calcifications are seen in soft tissues. IMPRESSION: No recent fracture or dislocation is seen. Previous right knee arthroplasty there Electronically Signed   By: Ernie Avena M.D.   On: 04/26/2023 09:32   DG Pelvis 1-2 Views  Result Date: 04/26/2023 CLINICAL DATA:  Fall EXAM: PELVIS - 1-2 VIEW COMPARISON:  CTA GI bleed 04/22/2023 FINDINGS: No fracture or dislocation. Soft tissues are unremarkable. Cholecystectomy clips and GDA embolization coils seen in the upper quadrant. Advanced degenerative changes of the lower lumbar spine. IMPRESSION: No acute abnormality of the pelvis. Electronically Signed   By: Acquanetta Belling M.D.   On: 04/26/2023 09:21   DG CHEST PORT 1 VIEW  Result Date: 04/26/2023 CLINICAL DATA:  Tachypnea EXAM: PORTABLE CHEST - 1 VIEW COMPARISON:  04/23/2023 FINDINGS: Cardiomediastinal silhouette and pulmonary vasculature are within normal limits. Interval extubation. Hazy opacities at the right lung base are new since prior examination and may be related to developing pneumonia. Right upper extremity PICC terminates at the cavoatrial junction. IMPRESSION: New hazy opacities at the right lung base may be due to atelectasis or pneumonia. Electronically Signed   By: Acquanetta Belling M.D.   On: 04/26/2023 09:18   DG Knee 1-2 Views Left  Result Date: 04/26/2023 CLINICAL DATA:  Fall  EXAM: LEFT KNEE - 1-2 VIEW COMPARISON:  04/21/2023 FINDINGS: Left total knee prosthesis is well seated without periprosthetic fracture or lucency. Small knee joint effusion is present. IMPRESSION: Uncomplicated left total knee prosthesis. Electronically Signed  By: Mauri Reading  Mir M.D.   On: 04/26/2023 09:16        Scheduled Meds:  sodium chloride   Intravenous Once   Chlorhexidine Gluconate Cloth  6 each Topical Daily   feeding supplement  1 Container Oral TID BM   furosemide  20 mg Intravenous Once   insulin aspart  0-20 Units Subcutaneous Q4H   levothyroxine  75 mcg Oral Q0600   loratadine  10 mg Oral Daily   midodrine  2.5 mg Oral TID WC   montelukast  10 mg Oral QHS   mouth rinse  15 mL Mouth Rinse 4 times per day   pantoprazole (PROTONIX) IV  40 mg Intravenous Q12H   potassium chloride  20 mEq Oral Q4H   sodium chloride flush  10-40 mL Intracatheter Q12H   Continuous Infusions:  sodium chloride Stopped (04/23/23 2320)   dextrose 80 mL/hr at 04/27/23 0857   piperacillin-tazobactam (ZOSYN)  IV 12.5 mL/hr at 04/27/23 0857   potassium chloride 50 mL/hr at 04/27/23 0857     LOS: 6 days    Time spent: 55 minutes spent on chart review, discussion with nursing staff, consultants, updating family and interview/physical exam; more than 50% of that time was spent in counseling and/or coordination of care.    Joseph Art, DO [facility] Available via Epic secure chat 7am-7pm After these hours, please refer to coverage provider listed on [email or web address] 04/27/2023, 9:49 AM

## 2023-04-27 NOTE — Progress Notes (Addendum)
Progress Note  Primary GI: Dr. Lavon Paganini  LOS: 6 days   Chief Complaint: Massive GI bleed   Subjective   Patient has remained hemodynamically stable. Has had no further melena. Denies hematemesis. Denies abdominal pain, nausea, and vomiting. Alert and oriented.    Objective   Vital signs in last 24 hours: Temp:  [97 F (36.1 C)-99.2 F (37.3 C)] 99.2 F (37.3 C) (06/04 0400) Pulse Rate:  [75-97] 80 (06/04 0600) Resp:  [16-25] 22 (06/04 0600) BP: (92-135)/(41-88) 109/41 (06/04 0600) SpO2:  [91 %-100 %] 99 % (06/04 0600) Last BM Date : 04/26/23 Last BM recorded by nurses in past 5 days Stool Type: Type 7 (Liquid consistency with no solid pieces); Type 6 (Mushy consistency with ragged edges) (04/26/2023 10:30 AM)  General:   female in no acute distress, extensive ecchymosis around eyes Heart:  Regular rate and rhythm; no murmurs Pulm: Clear anteriorly; no wheezing Abdomen: soft, nondistended, normal bowel sounds in all quadrants. Nontender without guarding. No organomegaly appreciated. Extremities:  No edema Neurologic:  Alert and  oriented x4;  No focal deficits.  Psych:  Cooperative. Normal mood and affect.  Intake/Output from previous day: 06/03 0701 - 06/04 0700 In: 2417.5 [I.V.:2145; IV Piggyback:272.5] Out: 4625 [Urine:4625] Intake/Output this shift: Total I/O In: 308.8 [I.V.:179.9; IV Piggyback:128.8] Out: -   Studies/Results: DG Knee 1-2 Views Right  Result Date: 04/26/2023 CLINICAL DATA:  Trauma, fall EXAM: RIGHT KNEE - 1-2 VIEW COMPARISON:  04/21/2023 FINDINGS: There is previous right knee arthroplasty. No recent fracture or dislocation is seen. There is no significant effusion in suprapatellar bursa. Scattered vascular calcifications are seen in soft tissues. IMPRESSION: No recent fracture or dislocation is seen. Previous right knee arthroplasty there Electronically Signed   By: Ernie Avena M.D.   On: 04/26/2023 09:32   DG Pelvis 1-2 Views  Result  Date: 04/26/2023 CLINICAL DATA:  Fall EXAM: PELVIS - 1-2 VIEW COMPARISON:  CTA GI bleed 04/22/2023 FINDINGS: No fracture or dislocation. Soft tissues are unremarkable. Cholecystectomy clips and GDA embolization coils seen in the upper quadrant. Advanced degenerative changes of the lower lumbar spine. IMPRESSION: No acute abnormality of the pelvis. Electronically Signed   By: Acquanetta Belling M.D.   On: 04/26/2023 09:21   DG CHEST PORT 1 VIEW  Result Date: 04/26/2023 CLINICAL DATA:  Tachypnea EXAM: PORTABLE CHEST - 1 VIEW COMPARISON:  04/23/2023 FINDINGS: Cardiomediastinal silhouette and pulmonary vasculature are within normal limits. Interval extubation. Hazy opacities at the right lung base are new since prior examination and may be related to developing pneumonia. Right upper extremity PICC terminates at the cavoatrial junction. IMPRESSION: New hazy opacities at the right lung base may be due to atelectasis or pneumonia. Electronically Signed   By: Acquanetta Belling M.D.   On: 04/26/2023 09:18   DG Knee 1-2 Views Left  Result Date: 04/26/2023 CLINICAL DATA:  Fall EXAM: LEFT KNEE - 1-2 VIEW COMPARISON:  04/21/2023 FINDINGS: Left total knee prosthesis is well seated without periprosthetic fracture or lucency. Small knee joint effusion is present. IMPRESSION: Uncomplicated left total knee prosthesis. Electronically Signed   By: Acquanetta Belling M.D.   On: 04/26/2023 09:16    Lab Results: Recent Labs    04/25/23 0414 04/25/23 1750 04/26/23 0500 04/27/23 0338  WBC 25.0*  --  19.0* 14.9*  HGB 7.0* 6.7* 7.5* 7.5*  HCT 21.8* 21.0* 23.1* 23.3*  PLT 81*  --  88* 101*   BMET Recent Labs    04/26/23 0500 04/26/23  0803 04/27/23 0338  NA 134* 136 136  K 3.7 3.6 3.1*  CL 106 107 101  CO2 25 25 29   GLUCOSE 100* 95 117*  BUN 15 16 12   CREATININE 0.88 0.98 0.95  CALCIUM 7.2* 7.3* 7.1*   LFT Recent Labs    04/26/23 0500  PROT 3.6*  ALBUMIN 1.8*  AST 64*  ALT 128*  ALKPHOS 24*  BILITOT 1.3*  BILIDIR  0.3*  IBILI 1.0*   PT/INR No results for input(s): "LABPROT", "INR" in the last 72 hours.   Scheduled Meds:  sodium chloride   Intravenous Once   Chlorhexidine Gluconate Cloth  6 each Topical Daily   feeding supplement  1 Container Oral TID BM   insulin aspart  0-20 Units Subcutaneous Q4H   levothyroxine  75 mcg Oral Q0600   loratadine  10 mg Oral Daily   montelukast  10 mg Oral QHS   mouth rinse  15 mL Mouth Rinse 4 times per day   pantoprazole (PROTONIX) IV  40 mg Intravenous Q12H   potassium chloride  20 mEq Oral Q4H   sodium chloride flush  10-40 mL Intracatheter Q12H   Continuous Infusions:  sodium chloride Stopped (04/23/23 2320)   dextrose 80 mL/hr at 04/27/23 0857   piperacillin-tazobactam (ZOSYN)  IV 12.5 mL/hr at 04/27/23 0857   potassium chloride 50 mL/hr at 04/27/23 0857      Patient profile:   72 year old female with a history of hypertension, obesity anxiety/depression who was admitted to the ICU May 29 with altered mental status and lethargy, found to have a hemoglobin of 4, lactic acid of nearly 9, acute kidney injury with creatinine 3 and BUN 100 and WBC 55K.  Initially without overt bleeding for first 24 hours, but then experienced massive UGIB arising from the gastroduodenal artery on the evening of May 30th, s/p IR coil embolization with significant improvement in bleeding.    Impression:   UGI bleed, suspect secondary to NSAID induced duodenal ulcer -Hgb 7.5, stable - BUN normalized at 12 -Platelets 101, improved Patient has improved clinically and is alert and oriented and more stable.  Patient has had no further melena and is hemodynamically stable.  Leukocytosis -WBC 14.9, improved -Completed course of ABX -Original concern for sepsis with unclear source -C. difficile and GI pathogen panel negative   Plan:   - Plan for EGD (will discuss timing with Dr. Chales Abrahams).  Patient is hemodynamically stable and has clinically improved.  Possibly EGD as  early as tomorrow(?) -Continue IV PPI -Okay to advance to clear diet from GI standpoint -Continue daily CBC and transfuse as needed to maintain HGB > 7    Nicole Peters  04/27/2023, 9:04 AM     Attending physician's note   I have taken history, reviewed the chart and examined the patient. I performed a substantive portion of this encounter, including complete performance of at least one of the key components, in conjunction with the APP. I agree with the Advanced Practitioner's note, impression and recommendations.   No further melena. Hb stable  For EGD in AM.  I have discussed risks and benefits.  She wishes to proceed. Continue IV PPIs. Trend CBC.   Edman Circle, MD [facility] [phone]
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
## 2023-04-27 NOTE — Progress Notes (Signed)
Nutrition Follow-up  DOCUMENTATION CODES:   Obesity unspecified  INTERVENTION:  - Clear Liquid diet per MD. Advance as medically appropriate.  - Boost Breeze po TID, each supplement provides 250 kcal and 9 grams of protein - Monitor weight trends.   NUTRITION DIAGNOSIS:   Increased nutrient needs related to acute illness as evidenced by estimated needs.  GOAL:   Patient will meet greater than or equal to 90% of their needs  MONITOR:   PO intake, Supplement acceptance, Diet advancement, Labs  REASON FOR ASSESSMENT:   Ventilator    ASSESSMENT:   72 y.o. female with PMHx including HTN, HLD, obesity who presents with AMS following a mechanical fall 3 days ago where she struck her head and declined evaluation. Patient admitted for renal failure, acidosis and symptomatic anemia  5/29 Admit 5/31 Intubated 6/1 Extubated  Patient endorses UBW of 200# and denied recent changes in weight. Per EMR, no weight hx since 2019 to assess changes.   Patient endorses eating well at home prior to admission. Usually has a doughnut/muffin and coffee for breakfast, a lunch out with her husband, and a sandwich or salad for dinner.   She admits her current appetite is very poor. Only on clear liquids at this time. Encouraged patient to order and try and consume 3 meals a day as tolerated. She is agreeable to try [employer].   Medications reviewed and include: D10 @ 62mL/hr (provides 653 kcals over 24 hours)  Labs reviewed:  K+ 3.1    NUTRITION - FOCUSED PHYSICAL EXAM:  No depletions noted  Diet Order:   Diet Order             Diet clear liquid Room service appropriate? Yes; Fluid consistency: Thin  Diet effective now                   EDUCATION NEEDS:  Education needs have been addressed  Skin:  Skin Assessment: Reviewed RN Assessment  Last BM:  6/3 - fecal management system  Height:  Ht Readings from Last 1 Encounters:  04/23/23 5\' 4"  (1.626 m)   Weight:  Wt  Readings from Last 1 Encounters:  04/25/23 103.8 kg    BMI:  Body mass index is 39.29 kg/m.  Estimated Nutritional Needs:  Kcal:  1780-1980 kcal Protein:  70-80 g Fluid:  > 1.7L    Shelle Iron RD, LDN For contact information, refer to [facility].

## 2023-04-27 NOTE — Progress Notes (Signed)
[facility] ADULT ICU REPLACEMENT PROTOCOL   The patient does apply for the [facility] Adult ICU Electrolyte Replacment Protocol based on the criteria listed below:   1.Exclusion criteria: TCTS, ECMO, Dialysis, and Myasthenia Gravis patients 2. Is GFR >/= 30 ml/min? Yes.    Patient's GFR today is >60 3. Is SCr </= 2? Yes.   Patient's SCr is 0.95 mg/dL 4. Did SCr increase >/= 0.5 in 24 hours? Yes.   5.Pt's weight >40kg  Yes.   6. Abnormal electrolyte(s): k 3.1  7. Electrolytes replaced per protocol 8.  Call MD STAT for K+ </= 2.5, Phos </= 1, or Mag </= 1 Physician:    Markus Daft A 04/27/2023 4:52 AM

## 2023-04-28 ENCOUNTER — Encounter (HOSPITAL_COMMUNITY): Payer: Self-pay | Admitting: Pulmonary Disease

## 2023-04-28 ENCOUNTER — Inpatient Hospital Stay (HOSPITAL_COMMUNITY): Payer: Medicare Other | Admitting: Anesthesiology

## 2023-04-28 ENCOUNTER — Encounter (HOSPITAL_COMMUNITY)
Admission: EM | Disposition: A | Discharge status: Skilled Nursing Facility | Payer: Self-pay | Source: Home / Self Care | Attending: Internal Medicine

## 2023-04-28 DIAGNOSIS — K921 Melena: Secondary | ICD-10-CM | POA: Diagnosis not present

## 2023-04-28 DIAGNOSIS — K269 Duodenal ulcer, unspecified as acute or chronic, without hemorrhage or perforation: Secondary | ICD-10-CM

## 2023-04-28 DIAGNOSIS — K222 Esophageal obstruction: Secondary | ICD-10-CM

## 2023-04-28 DIAGNOSIS — K449 Diaphragmatic hernia without obstruction or gangrene: Secondary | ICD-10-CM

## 2023-04-28 DIAGNOSIS — K209 Esophagitis, unspecified without bleeding: Secondary | ICD-10-CM

## 2023-04-28 DIAGNOSIS — F418 Other specified anxiety disorders: Secondary | ICD-10-CM

## 2023-04-28 DIAGNOSIS — D62 Acute posthemorrhagic anemia: Secondary | ICD-10-CM | POA: Diagnosis not present

## 2023-04-28 HISTORY — PX: ESOPHAGOGASTRODUODENOSCOPY: SHX5428

## 2023-04-28 HISTORY — PX: BIOPSY: SHX5522

## 2023-04-28 LAB — GLUCOSE, CAPILLARY
Glucose-Capillary: 104 mg/dL — ABNORMAL HIGH (ref 70–99)
Glucose-Capillary: 113 mg/dL — ABNORMAL HIGH (ref 70–99)
Glucose-Capillary: 137 mg/dL — ABNORMAL HIGH (ref 70–99)
Glucose-Capillary: 78 mg/dL (ref 70–99)
Glucose-Capillary: 79 mg/dL (ref 70–99)
Glucose-Capillary: 93 mg/dL (ref 70–99)

## 2023-04-28 LAB — BASIC METABOLIC PANEL
Anion gap: 4 — ABNORMAL LOW (ref 5–15)
BUN: 7 mg/dL — ABNORMAL LOW (ref 8–23)
CO2: 31 mmol/L (ref 22–32)
Calcium: 7.4 mg/dL — ABNORMAL LOW (ref 8.9–10.3)
Chloride: 101 mmol/L (ref 98–111)
Creatinine, Ser: 0.76 mg/dL (ref 0.44–1.00)
GFR, Estimated: >60 mL/min (ref >60)
Glucose, Bld: 96 mg/dL (ref 70–99)
Potassium: 3.5 mmol/L (ref 3.5–5.1)
Sodium: 136 mmol/L (ref 135–145)

## 2023-04-28 LAB — CBC
HCT: 26.5 % — ABNORMAL LOW (ref 36.0–46.0)
Hemoglobin: 8.3 g/dL — ABNORMAL LOW (ref 12.0–15.0)
MCH: 31 pg (ref 26.0–34.0)
MCHC: 31.3 g/dL (ref 30.0–36.0)
MCV: 98.9 fL (ref 80.0–100.0)
Platelets: 122 10*3/uL — ABNORMAL LOW (ref 150–400)
RBC: 2.68 MIL/uL — ABNORMAL LOW (ref 3.87–5.11)
RDW: 17.9 % — ABNORMAL HIGH (ref 11.5–15.5)
WBC: 11.6 10*3/uL — ABNORMAL HIGH (ref 4.0–10.5)
nRBC: 0.3 % — ABNORMAL HIGH (ref 0.0–0.2)

## 2023-04-28 SURGERY — EGD (ESOPHAGOGASTRODUODENOSCOPY)
Anesthesia: Monitor Anesthesia Care

## 2023-04-28 MED ORDER — PROPOFOL 10 MG/ML IV BOLUS
INTRAVENOUS | Status: DC | PRN
  Administered 2023-04-28: 10 mg via INTRAVENOUS

## 2023-04-28 MED ORDER — ONDANSETRON HCL 4 MG/2ML IJ SOLN
INTRAMUSCULAR | Status: DC | PRN
  Administered 2023-04-28: 4 mg via INTRAVENOUS

## 2023-04-28 MED ORDER — LIDOCAINE 2% (20 MG/ML) 5 ML SYRINGE
INTRAMUSCULAR | Status: DC | PRN
  Administered 2023-04-28: 40 mg via INTRAVENOUS

## 2023-04-28 MED ORDER — PROPOFOL 500 MG/50ML IV EMUL
INTRAVENOUS | Status: DC | PRN
  Administered 2023-04-28: 75 ug/kg/min via INTRAVENOUS

## 2023-04-28 MED ORDER — PROPOFOL 500 MG/50ML IV EMUL
INTRAVENOUS | Status: AC
  Filled 2023-04-28: qty 50

## 2023-04-28 MED ORDER — LACTATED RINGERS IV SOLN: INTRAVENOUS | Status: DC

## 2023-04-28 MED ORDER — SODIUM CHLORIDE 0.9 % IV SOLN: INTRAVENOUS | Status: DC

## 2023-04-28 NOTE — Transfer of Care (Signed)
Immediate Anesthesia Transfer of Care Note  Patient: Nicole Peters  Procedure(s) Performed: ESOPHAGOGASTRODUODENOSCOPY (EGD)  Patient Location: Endoscopy Unit  Anesthesia Type:MAC  Level of Consciousness: awake and patient cooperative  Airway & Oxygen Therapy: Patient Spontanous Breathing and Patient connected to face mask  Post-op Assessment: Report given to RN and Post -op Vital signs reviewed and stable  Post vital signs: Reviewed and stable  Last Vitals:  Vitals Value Taken Time  BP    Temp    Pulse    Resp    SpO2      Last Pain:  Vitals:   04/28/23 1255  TempSrc: Temporal  PainSc: 0-No pain         Complications: No notable events documented.

## 2023-04-28 NOTE — Progress Notes (Addendum)
PROGRESS NOTE    NADIJA DHALIWAL  WUJ:[id number] DOB: 1951-02-10 DOA: 04/21/2023 PCP: Lezlie Lye, Meda Coffee, MD    Brief Narrative:   Nicole Peters is a 72 y.o. female with past medical history significant for essential pretension, hyperlipidemia, anxiety/depression, osteoporosis, morbid obesity who presented to [facility] ED on 5/29 via EMS with confusion, poor appetite.  Patient with reported fall at home 3 days prior.  On EMS arrival, patient with some confusion and while trying to stand up she experienced a syncopal episode.  Workup in the ED notable for sodium of 128, CO2 15, BUN 115, creatinine 3.05, lactic acid 8.8, WBC 55.6, hemoglobin 4.4.  EDP consulted PCCM and patient was admitted to the intensive care unit under the pulmonary critical care service.  Significant Hospital events: 5/29: Admitted to the PCCM service, hemoglobin 4.4 5/30: Started on low-dose vasopressors, confusion increased, transfusing additional PRBC.  CTA GI given transition to brisk melena with findings of active extravasation from mid gastroduodenal artery, noted transient atrial fibrillation 5/31: IR consulted underwent GDA embolization, intubated postprocedure 6/1: Extubated, vasopressors weaned off 6/4: Transferred to the hospitalist service  Assessment & Plan:   Upper GI bleed likely secondary to NSAID induced duodenal ulcer Acute blood loss anemia Hypovolemic shock Patient presenting to ED with confusion, syncopal episode and was found to have a hemoglobin of 4.4.  Etiology likely secondary to NSAID induced duodenal ulcer.  During the hospitalization; bleeding had become more brisk and underwent CT angiogram GI study with noted active extravasation from mid gastroduodenal artery.  Patient did require vasopressor support due to significant blood loss which was eventually titrated off.  Patient was transfused 2 unit PRBCs on 5/29, 3 units PRBCs on 5/30, 1 unit pRBC and 2 units cryoprecipitate 5/31,  1 unit PRBC 6/2.  Interventional radiology was consulted and patient underwent GDA embolization on 5/31. -- [facility] following; appreciate assistance -- Hgb 4.4>>7.8>6.2>>8.7>>6.7>7.5>7.5>8.3 --Protonix 40 mg IV every 12 hours -- EGD planned for today, n.p.o. -- CBC daily  Leukocytosis No infectious etiology elucidated.  C. difficile and GI PCR panel negative.  Blood cultures x 2 from 5/29 with no growth.  Completed 6-day course of IV antibiotics. -- WBC 55.6>48.9>>14.9>11.6 -- CBC daily  Acute metabolic encephalopathy: Improving Etiology likely multifactorial in the setting of acute blood loss anemia secondary to upper GI bleed, uremia secondary to renal failure with acidosis.  -- Continue to hold home Xanax for now --Consider MR brain for further evaluation if mental status deteriorates or failure of further improvement  Acute renal failure Etiology likely secondary to prerenal azotemia in the setting of poor oral intake in days preceding hospitalization versus ATN from significant blood loss anemia as above requiring vasopressor support initially while in the intensive care unit.  Renal ultrasound with no acute findings. -- Cr 3.05>>>0.76 -- BMP daily  Acute hypoxic respiratory failure Suspect etiology likely related to blood loss and significant volume overload with IV fluids/blood resuscitation during hospitalization.  Received IV Lasix with improvement. -- Incentive spirometry -- Repeat chest x-ray in the a.m. -- Continue to wean supplemental oxygen to maintain SpO2 > 92%  Hypoglycemia -- Continue IV fluids with D10 at 80 mL/h; will continue to wean/titrate down once able to start diet  Hypokalemia Repleted, potassium 3.5 this morning. -- BMP in a.m. with magnesium  Hypothyroidism TSH 15.847, free T4 0.72. --Levothyroxine 25 mcg p.o. daily  Paroxysmal atrial fibrillation Essential hypertension Home medications include benazepril 20 mg p.o. daily, amlodipine 5  mg p.o.  daily.  Patient did require vasopressor support during initial hospitalization due to hypovolemic shock as above. -- BP 113/70 -- Continue to hold home antihypertensives -- Anticoagulation contraindicated given GI bleed as above -- Monitor on telemetry  Hyperlipidemia -- Simvastatin 80 mg p.o. daily  Anxiety/depression On Xanax as needed at baseline.  Currently on hold due to encephalopathy as above.  Weakness/debility/deconditioning: -- PT/OT evaluation    DVT prophylaxis: SCDs Start: 04/21/23 2120    Code Status: Full Code Family Communication: No family present at bedside this morning  Disposition Plan:  Level of care: ICU Status is: Inpatient Remains inpatient appropriate because: Pending EGD today    Consultants:  PCCM: Signed off 6/4 [place] [facility] Interventional radiology  Procedures:  Renal u/s 5/30 >> no acute findings CT angio GI bleed 5/30 >> active extravasation from mid-gastroduodenal artery, coronary calcification, small HH, active hemorrhage from 2nd portion of duodenum, severe sigmoid diverticulosis  Antimicrobials:  Vancomycin 5/29 - 5/29 Cefepime 5/29 - 6/1 Zosyn 6/2 - 6/3 Metronidazole 5/29 - 6/1   Subjective: Patient seen examined bedside, resting comfortably in bed.  Remains slightly confused but improved.  Awaiting EGD this afternoon.  Denies headache, no visual changes, no chest pain, no palpitations, no shortness of breath, no abdominal pain, no fever, no chills, no nausea/vomiting/diarrhea.  No acute events overnight per nursing staff.  Objective: Vitals:   04/28/23 0900 04/28/23 1000 04/28/23 1100 04/28/23 1210  BP: 116/62 112/78 113/70   Pulse: 80 76 77   Resp: (!) 24 20 (!) 24   Temp:    97.8 F (36.6 C)  TempSrc:    Oral  SpO2: 99% 99% 95%   Weight:      Height:        Intake/Output Summary (Last 24 hours) at 04/28/2023 1232 Last data filed at 04/28/2023 0600 Gross per 24 hour  Intake 1644.76 ml  Output 4850 ml  Net  -3205.24 ml   Filed Weights   04/24/23 0348 04/25/23 0500 04/28/23 0500  Weight: 103.8 kg 103.8 kg 104.3 kg    Examination:  Physical Exam: GEN: NAD, alert and oriented x 3, obese HEENT: NCAT, PERRL, EOMI, sclera clear, MMM PULM: CTAB w/o wheezes/crackles, normal respiratory effort, on 3 L nasal cannula with SpO2 99% at rest CV: RRR w/o M/G/R GI: abd soft, NTND, NABS, no R/G/M MSK: no peripheral edema, moves all extremities independently NEURO: CN II-XII intact, no focal deficits, sensation to light touch intact PSYCH: normal mood/affect Integumentary: dry/intact, no rashes or wounds    Data Reviewed: I have personally reviewed following labs and imaging studies  CBC: Recent Labs  Lab 04/22/23 0535 04/22/23 1203 04/23/23 1812 04/23/23 2142 04/24/23 0406 04/24/23 0628 04/25/23 0414 04/25/23 1750 04/26/23 0500 04/27/23 0338 04/28/23 0624  WBC 48.9*   < > 41.6*  --   --   --  25.0*  --  19.0* 14.9* 11.6*  NEUTROABS 39.6*  --   --   --   --   --  20.3*  --   --   --   --   HGB 6.7*   < > 8.2*   < >  --    < > 7.0* 6.7* 7.5* 7.5* 8.3*  HCT 18.5*   < > 24.1*   < >  --    < > 21.8* 21.0* 23.1* 23.3* 26.5*  MCV 86.4   < > 90.9  --   --   --  96.5  --  96.7  97.5 98.9  PLT 168   < > 92*  92*   < > 89*  --  81*  --  88* 101* 122*   < > = values in this interval not displayed.   Basic Metabolic Panel: Recent Labs  Lab 04/22/23 0535 04/22/23 0915 04/23/23 0753 04/23/23 1812 04/25/23 0414 04/26/23 0500 04/26/23 0803 04/27/23 0338 04/28/23 0624  NA 131*  --  135   < > 139 134* 136 136 136  K 5.5*   < > 3.9   < > 3.7 3.7 3.6 3.1* 3.5  CL 104  --  109   < > 112* 106 107 101 101  CO2 18*  --  18*   < > 24 25 25 29 31   GLUCOSE 128*  --  176*   < > 77 100* 95 117* 96  BUN 115*  --  65*   < > 26* 15 16 12  7*  CREATININE 2.95*  --  1.48*   < > 0.91 0.88 0.98 0.95 0.76  CALCIUM 6.8*  --  7.2*   < > 7.6* 7.2* 7.3* 7.1* 7.4*  MG 1.6*  --  2.0  --  2.0  --   --  1.7  --    PHOS 5.5*  --  3.1  --  2.3* 2.5  --   --   --    < > = values in this interval not displayed.   GFR: Estimated Creatinine Clearance: 74.8 mL/min (by C-G formula based on SCr of 0.76 mg/dL). Liver Function Tests: Recent Labs  Lab 04/21/23 1845 04/22/23 1631 04/23/23 0753 04/24/23 0406 04/26/23 0500  AST 40  --  241* 124* 64*  ALT 21  --  333* 226* 128*  ALKPHOS 26*  --  25* 28* 24*  BILITOT 0.7 1.8* 1.2 0.8 1.3*  PROT 4.7*  --  3.7* 3.7* 3.6*  ALBUMIN 2.5*  --  2.0* 2.0* 1.8*   Recent Labs  Lab 04/21/23 1845  LIPASE 35   Recent Labs  Lab 04/24/23 0405  AMMONIA 20   Coagulation Profile: Recent Labs  Lab 04/23/23 0424 04/23/23 1208 04/23/23 1812 04/23/23 2143 04/24/23 0406  INR 1.4* 1.2 1.2 1.2 1.3*   Cardiac Enzymes: No results for input(s): "CKTOTAL", "CKMB", "CKMBINDEX", "TROPONINI" in the last 168 hours. BNP (last 3 results) No results for input(s): "PROBNP" in the last 8760 hours. HbA1C: No results for input(s): "HGBA1C" in the last 72 hours. CBG: Recent Labs  Lab 04/27/23 1933 04/27/23 2331 04/28/23 0332 04/28/23 0751 04/28/23 1113  GLUCAP 146* 80 137* 93 104*   Lipid Profile: No results for input(s): "CHOL", "HDL", "LDLCALC", "TRIG", "CHOLHDL", "LDLDIRECT" in the last 72 hours. Thyroid Function Tests: Recent Labs    04/26/23 1535 04/27/23 0338  TSH 15.847*  --   FREET4  --  0.72   Anemia Panel: Recent Labs    04/26/23 1535  VITAMINB12 647   Sepsis Labs: Recent Labs  Lab 04/21/23 1845 04/21/23 2149 04/22/23 0154 04/22/23 0535 04/26/23 0803  PROCALCITON  --   --   --  0.65 0.46  LATICACIDVEN 8.8* 8.9* 8.5* 3.1*  --     Recent Results (from the past 240 hour(s))  Culture, blood (routine x 2)     Status: None   Collection Time: 04/21/23  6:45 PM   Specimen: Left Antecubital; Blood  Result Value Ref Range Status   Specimen Description   Final    LEFT ANTECUBITAL BLOOD Performed at [facility], [address],  [place], [place] [address]    Special Requests   Final    BOTTLES DRAWN AEROBIC AND ANAEROBIC Blood Culture results may not be optimal due to an excessive volume of blood received in culture bottles Performed at [facility], [address], [place], [place] [address]    Culture   Final    NO GROWTH 5 DAYS Performed at [facility], [address] [place], [place] [address]    Report Status 04/26/2023 FINAL  Final  Resp panel by RT-PCR (RSV, Flu A&B, Covid) Anterior Nasal Swab     Status: None   Collection Time: 04/21/23  8:12 PM   Specimen: Anterior Nasal Swab  Result Value Ref Range Status   SARS Coronavirus 2 by RT PCR NEGATIVE NEGATIVE Final    Comment: (NOTE) SARS-CoV-2 target nucleic acids are NOT DETECTED.  The SARS-CoV-2 RNA is generally detectable in upper respiratory specimens during the acute phase of infection. The lowest concentration of SARS-CoV-2 viral copies this assay can detect is 138 copies/mL. A negative result does not preclude SARS-Cov-2 infection and should not be used as the sole basis for treatment or other patient management decisions. A negative result may occur with  improper specimen collection/handling, submission of specimen other than nasopharyngeal swab, presence of viral mutation(s) within the areas targeted by this assay, and inadequate number of viral copies(<138 copies/mL). A negative result must be combined with clinical observations, patient history, and epidemiological information. The expected result is Negative.  Fact Sheet for Patients:  BloggerCourse.com  Fact Sheet for Healthcare Providers:  SeriousBroker.it  This test is no t yet approved or cleared by the [place] FDA and  has been authorized for detection and/or diagnosis of SARS-CoV-2 by FDA under an Emergency Use Authorization (EUA). This EUA will remain  in effect (meaning this test can be used) for the  duration of the COVID-19 declaration under Section 564(b)(1) of the Act, 21 U.S.C.section 360bbb-3(b)(1), unless the authorization is terminated  or revoked sooner.       Influenza A by PCR NEGATIVE NEGATIVE Final   Influenza B by PCR NEGATIVE NEGATIVE Final    Comment: (NOTE) The Xpert Xpress SARS-CoV-2/FLU/RSV plus assay is intended as an aid in the diagnosis of influenza from Nasopharyngeal swab specimens and should not be used as a sole basis for treatment. Nasal washings and aspirates are unacceptable for Xpert Xpress SARS-CoV-2/FLU/RSV testing.  Fact Sheet for Patients: BloggerCourse.com  Fact Sheet for Healthcare Providers: SeriousBroker.it  This test is not yet approved or cleared by the [place] FDA and has been authorized for detection and/or diagnosis of SARS-CoV-2 by FDA under an Emergency Use Authorization (EUA). This EUA will remain in effect (meaning this test can be used) for the duration of the COVID-19 declaration under Section 564(b)(1) of the Act, 21 U.S.C. section 360bbb-3(b)(1), unless the authorization is terminated or revoked.     Resp Syncytial Virus by PCR NEGATIVE NEGATIVE Final    Comment: (NOTE) Fact Sheet for Patients: BloggerCourse.com  Fact Sheet for Healthcare Providers: SeriousBroker.it  This test is not yet approved or cleared by the [place] FDA and has been authorized for detection and/or diagnosis of SARS-CoV-2 by FDA under an Emergency Use Authorization (EUA). This EUA will remain in effect (meaning this test can be used) for the duration of the COVID-19 declaration under Section 564(b)(1) of the Act, 21 U.S.C. section 360bbb-3(b)(1), unless the authorization is terminated or revoked.  Performed at [facility], [address] [place], [place] [address]   Culture, blood (routine x 2)     Status: None    Collection Time: 04/21/23  8:19 PM   Specimen: BLOOD  Result Value Ref Range Status   Specimen Description   Final    BLOOD RIGHT ANTECUBITAL Performed at [facility], [address] [place], [place] [address]    Special Requests   Final    BOTTLES DRAWN AEROBIC AND ANAEROBIC Blood Culture adequate volume Performed at [facility], [address], [place], [place] [address]    Culture   Final    NO GROWTH 5 DAYS Performed at [facility], [address], [place], [place] [address]    Report Status 04/26/2023 FINAL  Final  Urine Culture (for pregnant, neutropenic or urologic patients or patients with an indwelling urinary catheter)     Status: None   Collection Time: 04/21/23  8:36 PM   Specimen: Urine, Clean Catch  Result Value Ref Range Status   Specimen Description   Final    URINE, CLEAN CATCH Performed at [facility], [address], [place], [place] [address]    Special Requests   Final    NONE Performed at [facility], [address] [place], [place] [address]    Culture   Final    NO GROWTH Performed at [facility], [address], [place], [place] [address]    Report Status 04/22/2023 FINAL  Final  C Difficile Quick Screen w PCR reflex     Status: None   Collection Time: 04/22/23  4:48 PM   Specimen: STOOL  Result Value Ref Range Status   C Diff antigen NEGATIVE NEGATIVE Final   C Diff toxin NEGATIVE NEGATIVE Final   C Diff interpretation No C. difficile detected.  Final    Comment: Performed at [facility], [address] [place], [place] [address]  Gastrointestinal Panel by PCR , Stool     Status: None   Collection Time: 04/22/23  7:29 PM   Specimen: Stool  Result Value Ref Range Status   Campylobacter species NOT DETECTED NOT DETECTED Final   Plesimonas shigelloides NOT DETECTED NOT DETECTED Final   Salmonella species NOT DETECTED NOT DETECTED  Final   Yersinia enterocolitica NOT DETECTED NOT DETECTED Final   Vibrio species NOT DETECTED NOT DETECTED Final   Vibrio cholerae NOT DETECTED NOT DETECTED Final   Enteroaggregative E coli (EAEC) NOT DETECTED NOT DETECTED Final   Enteropathogenic E coli (EPEC) NOT DETECTED NOT DETECTED Final   Enterotoxigenic E coli (ETEC) NOT DETECTED NOT DETECTED Final   Shiga like toxin producing E coli (STEC) NOT DETECTED NOT DETECTED Final   Shigella/Enteroinvasive E coli (EIEC) NOT DETECTED NOT DETECTED Final   Cryptosporidium NOT DETECTED NOT DETECTED Final   Cyclospora cayetanensis NOT DETECTED NOT DETECTED Final   Entamoeba histolytica NOT DETECTED NOT DETECTED Final   Giardia lamblia NOT DETECTED NOT DETECTED Final   Adenovirus F40/41 NOT DETECTED NOT DETECTED Final   Astrovirus NOT DETECTED NOT DETECTED Final   Norovirus GI/GII NOT DETECTED NOT DETECTED Final   Rotavirus A NOT DETECTED NOT DETECTED Final   Sapovirus (I, II, IV, and V) NOT DETECTED NOT DETECTED Final    Comment: Performed at [facility], [address], [place], [place] [address]         Radiology Studies: No results found.  Scheduled Meds:  sodium chloride   Intravenous Once   Chlorhexidine Gluconate Cloth  6 each Topical Daily   feeding supplement  1 Container Oral TID BM   levothyroxine  75 mcg Oral Q0600   loratadine  10 mg Oral Daily   midodrine  2.5 mg Oral TID WC   montelukast  10 mg Oral QHS   mouth rinse  15 mL Mouth Rinse 4 times per day   pantoprazole (PROTONIX) IV  40 mg Intravenous Q12H   sodium chloride flush  10-40 mL Intracatheter Q12H   Continuous Infusions:  sodium chloride Stopped (04/23/23 2320)   dextrose 80 mL/hr at 04/28/23 1027     LOS: 7 days    Time spent: 52 minutes spent on chart review, discussion with nursing staff, consultants, updating family and interview/physical exam; more than 50% of that time was spent in counseling and/or coordination of  care.    Alvira Philips [place], DO [facility] Available via Epic secure chat 7am-7pm After these hours, please refer to coverage provider listed on [email or web address] 04/28/2023, 12:32 PM

## 2023-04-28 NOTE — Op Note (Addendum)
[facility] Patient Name: Nicole Peters Procedure Date: 04/28/2023 MRN: [id number] Attending MD: Lynann Bologna , MD, [id number] Date of Birth: 02-20-1951 CSN: [id number] Age: 72 Admit Type: Inpatient Procedure:                Upper GI endoscopy Indications:              UGI bleed d/t DU s/p IR GDA embolization 5/30. No                            further bleeding. EGD for assessment of duodenal                            ulcer. Providers:                Lynann Bologna, MD, Fransisca Connors, Kandice Robinsons, Technician Referring MD:              Medicines:                Monitored Anesthesia Care Complications:            No immediate complications. Estimated Blood Loss:     Estimated blood loss: none. Procedure:                Pre-Anesthesia Assessment:                           - Prior to the procedure, a History and Physical                            was performed, and patient medications and                            allergies were reviewed. The patient's tolerance of                            previous anesthesia was also reviewed. The risks                            and benefits of the procedure and the sedation                            options and risks were discussed with the patient.                            All questions were answered, and informed consent                            was obtained. Prior Anticoagulants: The patient has                            taken no anticoagulant or antiplatelet agents. ASA  Grade Assessment: III - A patient with severe                            systemic disease. After reviewing the risks and                            benefits, the patient was deemed in satisfactory                            condition to undergo the procedure.                           After obtaining informed consent, the endoscope was                            passed under direct vision.  Throughout the                            procedure, the patient's blood pressure, pulse, and                            oxygen saturations were monitored continuously. The                            [id number]-H190 ([id number]) Olympus endoscope was introduced                            through the mouth, and advanced to the second part                            of duodenum. The upper GI endoscopy was                            accomplished without difficulty. The patient                            tolerated the procedure well. Scope In: Scope Out: Findings:      A widely patent and mild Schatzki ring was found at the gastroesophageal       junction with grade A esophagitis.      A 2 cm hiatal hernia was present.      The entire examined stomach was normal. Biopsies were taken with a cold       forceps for histology.      One large 2 cm deep, non-bleeding cratered duodenal ulcer with coil was       found in the first portion of the duodenum (involving anterior and       medial wall). It had surrounding erythema and edema. No stigmata of       bleeding. It did deform duodenum. No strictures. The second portion of       the duodenum was really intubated. No active bleeding Impression:               - Large DU with coil. No active bleeding.                           -  2 cm hiatal hernia with Schatzki's ring and grade                            A esophagitis. Moderate Sedation:      Not Applicable - Patient had care per Anesthesia. Recommendation:           - Return patient to hospital ward for ongoing care.                           - Resume previous diet.                           - Await pathology results.                           - No aspirin, ibuprofen, naproxen, or other                            non-steroidal anti-inflammatory drugs x 12 weeks.                            If bASA is needed, can resume in 4 weeks.                           - Use Protonix (pantoprazole) 40 mg IV BID while,                             then 40mg  po BID x 12 weeks, then 40mg  po QD                            indefinitely.                           - Repeat upper endoscopy in appox 12 weeks to                            evaluate the response to therapy.                           - If any further active bleeding, very limited                            endoscopic options d/t size and nature of ulcer.                            Then, rpt IR intervention vs Sx. Hopefully, worse                            is behind her.                           - The findings and recommendations were discussed                            with the  patient's husband. I also given him copy                            of the report and endoscopic pictures.                           - Will sign off for now. [name] to arrange GI FU.                            Pl call with any ? or change in status. Procedure Code(s):        --- Professional ---                           [id number], Esophagogastroduodenoscopy, flexible,                            transoral; with biopsy, single or multiple Diagnosis Code(s):        --- Professional ---                           K22.2, Esophageal obstruction                           K44.9, Diaphragmatic hernia without obstruction or                            gangrene                           K26.9, Duodenal ulcer, unspecified as acute or                            chronic, without hemorrhage or perforation                           K92.1, Melena (includes Hematochezia) CPT copyright 2022 [employer]. All rights reserved. The codes documented in this report are preliminary and upon coder review may  be revised to meet current compliance requirements. Lynann Bologna, MD 04/28/2023 2:19:35 PM This report has been signed electronically. Number of Addenda: 0

## 2023-04-28 NOTE — Anesthesia Postprocedure Evaluation (Signed)
Anesthesia Post Note  Patient: Nicole Peters  Procedure(s) Performed: ESOPHAGOGASTRODUODENOSCOPY (EGD)     Patient location during evaluation: PACU Anesthesia Type: MAC Level of consciousness: awake and alert and oriented Pain management: pain level controlled Vital Signs Assessment: post-procedure vital signs reviewed and stable Respiratory status: spontaneous breathing, nonlabored ventilation, respiratory function stable and patient connected to nasal cannula oxygen Cardiovascular status: stable and blood pressure returned to baseline Postop Assessment: no apparent nausea or vomiting Anesthetic complications: no   No notable events documented.  Last Vitals:  Vitals:   04/28/23 1255 04/28/23 1410  BP: 121/62 121/60  Pulse: 68 71  Resp: (!) 22 (!) 22  Temp: (!) 36.2 C (!) 36.3 C  SpO2: 94% 100%    Last Pain:  Vitals:   04/28/23 1410  TempSrc: Temporal  PainSc:                  Dalan Cowger A.

## 2023-04-28 NOTE — Anesthesia Preprocedure Evaluation (Addendum)
Anesthesia Evaluation  Patient identified by MRN, date of birth, ID band Patient awake    Reviewed: Allergy & Precautions, NPO status , Patient's Chart, lab work & pertinent test results, reviewed documented beta blocker date and time   Airway Mallampati: II  TM Distance: >3 FB Neck ROM: Full    Dental  (+) Upper Dentures, Missing, Dental Advisory Given, Poor Dentition   Pulmonary shortness of breath and with exertion, COPD,  COPD inhaler, former smoker Hx/o recent respiratory failure requiring mechanical ventilation, now extubated on [place]   breath sounds clear to auscultation + decreased breath sounds      Cardiovascular hypertension, Pt. on medications + DOE  + dysrhythmias Atrial Fibrillation  Rhythm:Regular Rate:Normal  '23 ECHO: EF 70-75%.  1.The LV has hyperdynamic function, no regional wall motion abnormalities. Grade I diastolic dysfunction (impaired relaxation).   2. RVF is normal. The right ventricular size is normal. There is mildly elevated pulmonary artery systolic pressure. The estimated right ventricular systolic pressure is 43.2 mmHg.   3. The mitral valve is normal in structure. No evidence of MR.   4. The aortic valve was not well visualized. There is mild calcification of the aortic valve. There is mild thickening of the aortic valve. Aortic valve regurgitation is not visualized. Aortic valve sclerosis/calcification is present, without any evidence of aortic stenosis.   EKG 5/29  NSR, low voltage precordial leads Had an episode of atrial fibrillation after admission, currently in NSR  Hypovolemic shock due to GI Bleed on admission, required pressors and transfusion S/P embolization of GDA in IR   Neuro/Psych  PSYCHIATRIC DISORDERS Anxiety Depression    Encephalopathy on admission, improving negative neurological ROS     GI/Hepatic Elevated LFT's  GI Bleed   Endo/Other  Hypothyroidism  Morbid obesityBMI  39 Hyperlipidemia  Renal/GU Renal diseaseAKI on admission BUN/Cr 115/3 now normalized  negative genitourinary   Musculoskeletal  (+) Arthritis , Osteoarthritis,    Abdominal  (+) + obese  Peds  Hematology  (+) Blood dyscrasia (Hb 8.3, plt 122k), anemia Thrombocytopenia   Anesthesia Other Findings   Reproductive/Obstetrics                              Anesthesia Physical Anesthesia Plan  ASA: 3  Anesthesia Plan: MAC   Post-op Pain Management: Minimal or no pain anticipated   Induction:   PONV Risk Score and Plan: 2 and Treatment may vary due to age or medical condition and Propofol infusion  Airway Management Planned: Natural Airway and Nasal Cannula  Additional Equipment: None  Intra-op Plan:   Post-operative Plan:   Informed Consent: I have reviewed the patients History and Physical, chart, labs and discussed the procedure including the risks, benefits and alternatives for the proposed anesthesia with the patient or authorized representative who has indicated his/her understanding and acceptance.     Dental advisory given  Plan Discussed with: CRNA and Anesthesiologist  Anesthesia Plan Comments:         Anesthesia Quick Evaluation

## 2023-04-29 ENCOUNTER — Inpatient Hospital Stay (HOSPITAL_COMMUNITY): Payer: Medicare Other

## 2023-04-29 DIAGNOSIS — D62 Acute posthemorrhagic anemia: Secondary | ICD-10-CM | POA: Diagnosis not present

## 2023-04-29 LAB — GLUCOSE, CAPILLARY
Glucose-Capillary: 107 mg/dL — ABNORMAL HIGH (ref 70–99)
Glucose-Capillary: 96 mg/dL (ref 70–99)
Glucose-Capillary: 92 mg/dL (ref 70–99)

## 2023-04-29 LAB — CBC
HCT: 27.6 % — ABNORMAL LOW (ref 36.0–46.0)
Hemoglobin: 8.5 g/dL — ABNORMAL LOW (ref 12.0–15.0)
MCH: 31.1 pg (ref 26.0–34.0)
MCHC: 30.8 g/dL (ref 30.0–36.0)
MCV: 101.1 fL — ABNORMAL HIGH (ref 80.0–100.0)
Platelets: 152 10*3/uL (ref 150–400)
RBC: 2.73 MIL/uL — ABNORMAL LOW (ref 3.87–5.11)
RDW: 17.3 % — ABNORMAL HIGH (ref 11.5–15.5)
WBC: 10.4 10*3/uL (ref 4.0–10.5)
nRBC: 0 % (ref 0.0–0.2)

## 2023-04-29 LAB — BASIC METABOLIC PANEL WITH GFR
Anion gap: 6 (ref 5–15)
BUN: 7 mg/dL — ABNORMAL LOW (ref 8–23)
CO2: 30 mmol/L (ref 22–32)
Calcium: 7.5 mg/dL — ABNORMAL LOW (ref 8.9–10.3)
Chloride: 101 mmol/L (ref 98–111)
Creatinine, Ser: 0.7 mg/dL (ref 0.44–1.00)
GFR, Estimated: >60 mL/min
Glucose, Bld: 114 mg/dL — ABNORMAL HIGH (ref 70–99)
Potassium: 3.5 mmol/L (ref 3.5–5.1)
Sodium: 137 mmol/L (ref 135–145)

## 2023-04-29 LAB — SURGICAL PATHOLOGY

## 2023-04-29 LAB — MAGNESIUM: Magnesium: 2.1 mg/dL (ref 1.7–2.4)

## 2023-04-29 MED ORDER — POTASSIUM CHLORIDE CRYS ER 20 MEQ PO TBCR
30.0000 meq | EXTENDED_RELEASE_TABLET | Freq: Once | ORAL | Status: AC
  Administered 2023-04-29: 30 meq via ORAL
  Filled 2023-04-29: qty 1

## 2023-04-29 MED ORDER — ENSURE ENLIVE PO LIQD
237.0000 mL | Freq: Two times a day (BID) | ORAL | Status: DC
  Administered 2023-05-01 – 2023-05-05 (×7): 237 mL via ORAL

## 2023-04-29 NOTE — Progress Notes (Signed)
VAST consult received to assess PICC site; reported as being pink/red. Upon arrival at bedside, noted Biopatch swollen and with dried blood. PICC dressing changed per protocol. Site with bruising proximal and on both sides of insertion site. Otherwise, site unremarkable. Needleless caps changed on each lumen d/t blood in caps. Each lumen with good blood return; flushed with normal saline and clamped. Unit RN notified.

## 2023-04-29 NOTE — Progress Notes (Signed)
PROGRESS NOTE    Nicole Peters  ZOX:[id number] DOB: [birth year] DOA: 04/21/2023 PCP: Nicole Peters    Brief Narrative:   Nicole Peters is a 72 y.o. female with past medical history significant for essential pretension, hyperlipidemia, anxiety/depression, osteoporosis, morbid obesity who presented to [facility] ED on 5/29 via EMS with confusion, poor appetite.  Patient with reported fall at home 3 days prior.  On EMS arrival, patient with some confusion and while trying to stand up she experienced a syncopal episode.  Workup in the ED notable for sodium of 128, CO2 15, BUN 115, creatinine 3.05, lactic acid 8.8, WBC 55.6, hemoglobin 4.4.  EDP consulted PCCM and patient was admitted to the intensive care unit under the pulmonary critical care service.  Significant Hospital events: 5/29: Admitted to the PCCM service, hemoglobin 4.4 5/30: Started on low-dose vasopressors, confusion increased, transfusing additional PRBC.  CTA GI given transition to brisk melena with findings of active extravasation from mid gastroduodenal artery, noted transient atrial fibrillation 5/31: IR consulted underwent GDA embolization, intubated postprocedure 6/1: Extubated, vasopressors weaned off 6/4: Transferred to the hospitalist service 6/5: EGD with large duodenal ulcer, nonbleeding 6/6: Transfer to medical floor  Assessment & Plan:   Upper GI bleed likely secondary to NSAID induced duodenal ulcer Acute blood loss anemia Hypovolemic shock Patient presenting to ED with confusion, syncopal episode and was found to have a hemoglobin of 4.4.  Etiology likely secondary to NSAID induced duodenal ulcer.  During the hospitalization; bleeding had become more brisk and underwent CT angiogram GI study with noted active extravasation from mid gastroduodenal artery.  Patient did require vasopressor support due to significant blood loss which was eventually titrated off.  Patient was transfused 2 unit  PRBCs on 5/29, 3 units PRBCs on 5/30, 1 unit pRBC and 2 units cryoprecipitate 5/31, 1 unit PRBC 6/2.  Interventional radiology was consulted and patient underwent GDA embolization on 5/31.  GI was consulted and patient underwent EGD on 6/5 with findings of large 2 cm deep with nonbleeding cratered duodenal ulcer with coil first portion of the duodenum with surrounding erythema/edema. -- [facility] following; appreciate assistance -- Hgb 4.4>>7.8>6.2>>8.7>>6.7>7.5>7.5>8.3 -- Protonix 40 mg IV every 12 hours (plan BID dosing on discharge for 12 weeks followed by once daily thereafter) -- Avoidance of NSAIDs x 12 weeks, no aspirin for 4 weeks -- CBC daily  Leukocytosis No infectious etiology elucidated.  C. difficile and GI PCR panel negative.  Blood cultures x 2 from 5/29 with no growth.  Completed 6-day course of IV antibiotics. -- WBC 55.6>48.9>>14.9>11.6>10.4 -- CBC daily  Acute metabolic encephalopathy: Resolved Etiology likely multifactorial in the setting of acute blood loss anemia secondary to upper GI bleed, uremia secondary to renal failure with acidosis.  -- Continue to hold home Xanax for now -- Consider MR brain for further evaluation if mental status deteriorates or failure of further improvement  Acute renal failure Etiology likely secondary to prerenal azotemia in the setting of poor oral intake in days preceding hospitalization versus ATN from significant blood loss anemia as above requiring vasopressor support initially while in the intensive care unit.  Renal ultrasound with no acute findings. -- Cr 3.05>>>0.76>0.70 -- BMP daily  Acute hypoxic respiratory failure Suspect etiology likely related to blood loss and significant volume overload with IV fluids/blood resuscitation during hospitalization.  Received IV Lasix with improvement.  Chest x-ray 6/6 with no active cardiopulmonary disease process. -- Incentive spirometry -- Continue to wean supplemental  oxygen to maintain  SpO2 > 92%  Hypoglycemia -- Decrease D10 to 50 mL/h; will continue to wean/titrate down once able to start diet  Hypokalemia Repleted, potassium 3.5 this morning. -- BMP in a.m. with magnesium  Hypothyroidism TSH 15.847, free T4 0.72. -- Levothyroxine 25 mcg p.o. daily  Paroxysmal atrial fibrillation Essential hypertension Home medications include benazepril 20 mg p.o. daily, amlodipine 5 mg p.o. daily.  Patient did require vasopressor support during initial hospitalization due to hypovolemic shock as above. -- BP 108/46 this morning -- Continue to hold home antihypertensives -- Anticoagulation contraindicated given GI bleed as above -- Monitor on telemetry  Hyperlipidemia -- Simvastatin 80 mg p.o. daily  Anxiety/depression On Xanax as needed at baseline.  Currently on hold due to encephalopathy as above.  Weakness/debility/deconditioning: -- PT/OT evaluation: Pending    DVT prophylaxis: SCDs Start: 04/21/23 2120    Code Status: Full Code Family Communication: No family present at bedside this morning  Disposition Plan:  Level of care: Med-Surg Status is: Inpatient Remains inpatient appropriate because: Pending EGD today    Consultants:  PCCM: Signed off 6/4 [place] [facility] Interventional radiology  Procedures:  Renal u/s 5/30 >> no acute findings CT angio GI bleed 5/30 >> active extravasation from mid-gastroduodenal artery, coronary calcification, small HH, active hemorrhage from 2nd portion of duodenum, severe sigmoid diverticulosis  Antimicrobials:  Vancomycin 5/29 - 5/29 Cefepime 5/29 - 6/1 Zosyn 6/2 - 6/3 Metronidazole 5/29 - 6/1   Subjective: Patient seen examined bedside, resting comfortably in bed.  No specific complaints, hemoglobin up to 8.5 this morning.  Stable for transfer to the floor today.  Discussed removal of Foley catheter and PT evaluation.  Remains on 4 L nasal cannula with SpO2 100% at rest, chest x-ray this morning shows no  active cardiopulmonary disease process.  No other questions or concerns at this time.  Denies headache, no visual changes, no chest pain, no palpitations, no shortness of breath, no abdominal pain, no fever, no chills, no nausea/vomiting/diarrhea.  No acute events overnight per nursing staff.  Objective: Vitals:   04/29/23 0600 04/29/23 0700 04/29/23 0800 04/29/23 1000  BP: 134/62 (!) 122/37 (!) 125/42 (!) 130/57  Pulse: 80 85 84 82  Resp: 17 19 14  (!) 25  Temp:   97.9 F (36.6 C) (!) 97.5 F (36.4 C)  TempSrc:   Oral Oral  SpO2: 100% 99% 93% 94%  Weight:      Height:        Intake/Output Summary (Last 24 hours) at 04/29/2023 1101 Last data filed at 04/29/2023 0400 Gross per 24 hour  Intake 510.84 ml  Output 1375 ml  Net -864.16 ml   Filed Weights   04/25/23 0500 04/28/23 0500 04/29/23 0500  Weight: 103.8 kg 104.3 kg 103.1 kg    Examination:  Physical Exam: GEN: NAD, alert and oriented x 3, obese HEENT: NCAT, PERRL, EOMI, sclera clear, MMM PULM: CTAB w/o wheezes/crackles, normal respiratory effort, on 4 L nasal cannula with SpO2 100% at rest CV: RRR w/o M/G/R GI: abd soft, NTND, NABS, no R/G/M GU: Foley catheter noted in place with clear yellow urine in collection canister MSK: no peripheral edema, moves all extremities independently NEURO: CN II-XII intact, no focal deficits, sensation to light touch intact PSYCH: normal mood/affect Integumentary: Ecchymosis noted to bilateral malar surfaces and nasal bridge, otherwise no other concerning rashes/lesions/wounds noted exposed skin surfaces.      Data Reviewed: I have personally reviewed following labs and imaging studies  CBC: Recent  Labs  Lab 04/25/23 0414 04/25/23 1750 04/26/23 0500 04/27/23 0338 04/28/23 0624 04/29/23 0408  WBC 25.0*  --  19.0* 14.9* 11.6* 10.4  NEUTROABS 20.3*  --   --   --   --   --   HGB 7.0* 6.7* 7.5* 7.5* 8.3* 8.5*  HCT 21.8* 21.0* 23.1* 23.3* 26.5* 27.6*  MCV 96.5  --  96.7 97.5 98.9  101.1*  PLT 81*  --  88* 101* 122* 152   Basic Metabolic Panel: Recent Labs  Lab 04/23/23 0753 04/23/23 1812 04/25/23 0414 04/26/23 0500 04/26/23 0803 04/27/23 0338 04/28/23 0624 04/29/23 0408  NA 135   < > 139 134* 136 136 136 137  K 3.9   < > 3.7 3.7 3.6 3.1* 3.5 3.5  CL 109   < > 112* 106 107 101 101 101  CO2 18*   < > 24 25 25 29 31 30   GLUCOSE 176*   < > 77 100* 95 117* 96 114*  BUN 65*   < > 26* 15 16 12  7* 7*  CREATININE 1.48*   < > 0.91 0.88 0.98 0.95 0.76 0.70  CALCIUM 7.2*   < > 7.6* 7.2* 7.3* 7.1* 7.4* 7.5*  MG 2.0  --  2.0  --   --  1.7  --  2.1  PHOS 3.1  --  2.3* 2.5  --   --   --   --    < > = values in this interval not displayed.   GFR: Estimated Creatinine Clearance: 74.4 mL/min (by C-G formula based on SCr of 0.7 mg/dL). Liver Function Tests: Recent Labs  Lab 04/22/23 1631 04/23/23 0753 04/24/23 0406 04/26/23 0500  AST  --  241* 124* 64*  ALT  --  333* 226* 128*  ALKPHOS  --  25* 28* 24*  BILITOT 1.8* 1.2 0.8 1.3*  PROT  --  3.7* 3.7* 3.6*  ALBUMIN  --  2.0* 2.0* 1.8*   No results for input(s): "LIPASE", "AMYLASE" in the last 168 hours.  Recent Labs  Lab 04/24/23 0405  AMMONIA 20   Coagulation Profile: Recent Labs  Lab 04/23/23 0424 04/23/23 1208 04/23/23 1812 04/23/23 2143 04/24/23 0406  INR 1.4* 1.2 1.2 1.2 1.3*   Cardiac Enzymes: No results for input(s): "CKTOTAL", "CKMB", "CKMBINDEX", "TROPONINI" in the last 168 hours. BNP (last 3 results) No results for input(s): "PROBNP" in the last 8760 hours. HbA1C: No results for input(s): "HGBA1C" in the last 72 hours. CBG: Recent Labs  Lab 04/28/23 1601 04/28/23 1953 04/28/23 2346 04/29/23 0405 04/29/23 0423  GLUCAP 79 78 113* 107* 96   Lipid Profile: No results for input(s): "CHOL", "HDL", "LDLCALC", "TRIG", "CHOLHDL", "LDLDIRECT" in the last 72 hours. Thyroid Function Tests: Recent Labs    04/26/23 1535 04/27/23 0338  TSH 15.847*  --   FREET4  --  0.72   Anemia  Panel: Recent Labs    04/26/23 1535  VITAMINB12 647   Sepsis Labs: Recent Labs  Lab 04/26/23 0803  PROCALCITON 0.46    Recent Results (from the past 240 hour(s))  Culture, blood (routine x 2)     Status: None   Collection Time: 04/21/23  6:45 PM   Specimen: Left Antecubital; Blood  Result Value Ref Range Status   Specimen Description   Final    LEFT ANTECUBITAL BLOOD Performed at [facility], [address] [place], [place] [address]    Special Requests   Final    BOTTLES DRAWN  AEROBIC AND ANAEROBIC Blood Culture results may not be optimal due to an excessive volume of blood received in culture bottles Performed at [facility], [address], [place], [place] [address]    Culture   Final    NO GROWTH 5 DAYS Performed at [facility], [address] [place], [place] [address]    Report Status 04/26/2023 FINAL  Final  Resp panel by RT-PCR (RSV, Flu A&B, Covid) Anterior Nasal Swab     Status: None   Collection Time: 04/21/23  8:12 PM   Specimen: Anterior Nasal Swab  Result Value Ref Range Status   SARS Coronavirus 2 by RT PCR NEGATIVE NEGATIVE Final    Comment: (NOTE) SARS-CoV-2 target nucleic acids are NOT DETECTED.  The SARS-CoV-2 RNA is generally detectable in upper respiratory specimens during the acute phase of infection. The lowest concentration of SARS-CoV-2 viral copies this assay can detect is 138 copies/mL. A negative result does not preclude SARS-Cov-2 infection and should not be used as the sole basis for treatment or other patient management decisions. A negative result may occur with  improper specimen collection/handling, submission of specimen other than nasopharyngeal swab, presence of viral mutation(s) within the areas targeted by this assay, and inadequate number of viral copies(<138 copies/mL). A negative result must be combined with clinical observations, patient history, and epidemiological information. The  expected result is Negative.  Fact Sheet for Patients:  BloggerCourse.com  Fact Sheet for Healthcare Providers:  SeriousBroker.it  This test is no t yet approved or cleared by the [place] FDA and  has been authorized for detection and/or diagnosis of SARS-CoV-2 by FDA under an Emergency Use Authorization (EUA). This EUA will remain  in effect (meaning this test can be used) for the duration of the COVID-19 declaration under Section 564(b)(1) of the Act, 21 U.S.C.section 360bbb-3(b)(1), unless the authorization is terminated  or revoked sooner.       Influenza A by PCR NEGATIVE NEGATIVE Final   Influenza B by PCR NEGATIVE NEGATIVE Final    Comment: (NOTE) The Xpert Xpress SARS-CoV-2/FLU/RSV plus assay is intended as an aid in the diagnosis of influenza from Nasopharyngeal swab specimens and should not be used as a sole basis for treatment. Nasal washings and aspirates are unacceptable for Xpert Xpress SARS-CoV-2/FLU/RSV testing.  Fact Sheet for Patients: BloggerCourse.com  Fact Sheet for Healthcare Providers: SeriousBroker.it  This test is not yet approved or cleared by the [place] FDA and has been authorized for detection and/or diagnosis of SARS-CoV-2 by FDA under an Emergency Use Authorization (EUA). This EUA will remain in effect (meaning this test can be used) for the duration of the COVID-19 declaration under Section 564(b)(1) of the Act, 21 U.S.C. section 360bbb-3(b)(1), unless the authorization is terminated or revoked.     Resp Syncytial Virus by PCR NEGATIVE NEGATIVE Final    Comment: (NOTE) Fact Sheet for Patients: BloggerCourse.com  Fact Sheet for Healthcare Providers: SeriousBroker.it  This test is not yet approved or cleared by the [place] FDA and has been authorized for detection and/or  diagnosis of SARS-CoV-2 by FDA under an Emergency Use Authorization (EUA). This EUA will remain in effect (meaning this test can be used) for the duration of the COVID-19 declaration under Section 564(b)(1) of the Act, 21 U.S.C. section 360bbb-3(b)(1), unless the authorization is terminated or revoked.  Performed at [facility], [address] [place], [place] [address]   Culture, blood (routine x 2)  Status: None   Collection Time: 04/21/23  8:19 PM   Specimen: BLOOD  Result Value Ref Range Status   Specimen Description   Final    BLOOD RIGHT ANTECUBITAL Performed at [facility], [address], [place], [place] [address]    Special Requests   Final    BOTTLES DRAWN AEROBIC AND ANAEROBIC Blood Culture adequate volume Performed at [facility], [address], [place], [place] [address]    Culture   Final    NO GROWTH 5 DAYS Performed at [facility], [address] [place], [place] [address]    Report Status 04/26/2023 FINAL  Final  Urine Culture (for pregnant, neutropenic or urologic patients or patients with an indwelling urinary catheter)     Status: None   Collection Time: 04/21/23  8:36 PM   Specimen: Urine, Clean Catch  Result Value Ref Range Status   Specimen Description   Final    URINE, CLEAN CATCH Performed at [facility], [address], [place], [place] [address]    Special Requests   Final    NONE Performed at [facility], [address] [place], [place] [address]    Culture   Final    NO GROWTH Performed at [facility], [address], [place], [place] [address]    Report Status 04/22/2023 FINAL  Final  C Difficile Quick Screen w PCR reflex     Status: None   Collection Time: 04/22/23  4:48 PM   Specimen: STOOL  Result Value Ref Range Status   C Diff antigen NEGATIVE NEGATIVE Final   C Diff toxin NEGATIVE NEGATIVE Final   C Diff  interpretation No C. difficile detected.  Final    Comment: Performed at [facility], [address], [place], [place] [address]  Gastrointestinal Panel by PCR , Stool     Status: None   Collection Time: 04/22/23  7:29 PM   Specimen: Stool  Result Value Ref Range Status   Campylobacter species NOT DETECTED NOT DETECTED Final   Plesimonas shigelloides NOT DETECTED NOT DETECTED Final   Salmonella species NOT DETECTED NOT DETECTED Final   Yersinia enterocolitica NOT DETECTED NOT DETECTED Final   Vibrio species NOT DETECTED NOT DETECTED Final   Vibrio cholerae NOT DETECTED NOT DETECTED Final   Enteroaggregative E coli (EAEC) NOT DETECTED NOT DETECTED Final   Enteropathogenic E coli (EPEC) NOT DETECTED NOT DETECTED Final   Enterotoxigenic E coli (ETEC) NOT DETECTED NOT DETECTED Final   Shiga like toxin producing E coli (STEC) NOT DETECTED NOT DETECTED Final   Shigella/Enteroinvasive E coli (EIEC) NOT DETECTED NOT DETECTED Final   Cryptosporidium NOT DETECTED NOT DETECTED Final   Cyclospora cayetanensis NOT DETECTED NOT DETECTED Final   Entamoeba histolytica NOT DETECTED NOT DETECTED Final   Giardia lamblia NOT DETECTED NOT DETECTED Final   Adenovirus F40/41 NOT DETECTED NOT DETECTED Final   Astrovirus NOT DETECTED NOT DETECTED Final   Norovirus GI/GII NOT DETECTED NOT DETECTED Final   Rotavirus A NOT DETECTED NOT DETECTED Final   Sapovirus (I, II, IV, and V) NOT DETECTED NOT DETECTED Final    Comment: Performed at [facility], [address] [place], [place] [address]         Radiology Studies: DG CHEST PORT 1 VIEW  Result Date: 04/29/2023 CLINICAL DATA:  Shortness of breath EXAM: PORTABLE CHEST 1 VIEW COMPARISON:  04/26/2023 FINDINGS: Check shadow is stable. Aortic  calcifications are noted. Right PICC is noted in satisfactory position. Lungs are clear. No bony abnormality is noted. IMPRESSION: No active disease. Electronically Signed   By: Alcide Clever  M.D.   On: 04/29/2023 02:54        Scheduled Meds:  sodium chloride   Intravenous Once   Chlorhexidine Gluconate Cloth  6 each Topical Daily   feeding supplement  237 mL Oral BID BM   levothyroxine  75 mcg Oral Q0600   loratadine  10 mg Oral Daily   midodrine  2.5 mg Oral TID WC   montelukast  10 mg Oral QHS   mouth rinse  15 mL Mouth Rinse 4 times per day   pantoprazole (PROTONIX) IV  40 mg Intravenous Q12H   sodium chloride flush  10-40 mL Intracatheter Q12H   Continuous Infusions:  sodium chloride Stopped (04/23/23 2320)   dextrose Stopped (04/28/23 1247)     LOS: 8 days    Time spent: 52 minutes spent on chart review, discussion with nursing staff, consultants, updating family and interview/physical exam; more than 50% of that time was spent in counseling and/or coordination of care.    Alvira Philips [place], DO [facility] Available via Epic secure chat 7am-7pm After these hours, please refer to coverage provider listed on [email or web address] 04/29/2023, 11:01 AM

## 2023-04-29 NOTE — Evaluation (Signed)
Physical Therapy Evaluation Patient Details Name: Nicole Peters MRN: [id number] DOB: [birth year] Today's Date: 04/29/2023  History of Present Illness  72 y.o. female who presented to [facility] ED on 5/29 via EMS with confusion, weakness, fall, Hgb 4.4; GI consulted. admitted with Upper GI bleed likely secondary to NSAID induced duodenal ulcer ,Acute blood loss anemia and  Hypovolemic shock, hypoxia, renal failure PMH: essential pretension, hyperlipidemia, anxiety/depression, osteoporosis, morbid obesity  Clinical Impression  Pt admitted with above diagnosis.  Pt  is independent at baseline, currently deconditioned and dyspneic with light activity. Will follow in acute setting, may benefit from post acute f/u therapy in home setting.  Pt currently with functional limitations due to the deficits listed below (see PT Problem List). Pt will benefit from acute skilled PT to increase their independence and safety with mobility to allow discharge.          Recommendations for follow up therapy are one component of a multi-disciplinary discharge planning process, led by the attending physician.  Recommendations may be updated based on patient status, additional functional criteria and insurance authorization.  Follow Up Recommendations       Assistance Recommended at Discharge Intermittent Supervision/Assistance  Patient can return home with the following  Assist for transportation;Assistance with cooking/housework;Help with stairs or ramp for entrance    Equipment Recommendations None recommended by PT  Recommendations for Other Services       Functional Status Assessment Patient has had a recent decline in their functional status and demonstrates the ability to make significant improvements in function in a reasonable and predictable amount of time.     Precautions / Restrictions Precautions Precautions: Fall Restrictions Weight Bearing Restrictions: No      Mobility  Bed  Mobility Overal bed mobility: Needs Assistance Bed Mobility: Supine to Sit     Supine to sit: Min assist     General bed mobility comments: assist to elevate trunk, incr time    Transfers Overall transfer level: Needs assistance Equipment used: Rolling walker (2 wheels) Transfers: Sit to/from Stand Sit to Stand: Min assist           General transfer comment: assist to rise and transition to RW, cues for hand placement and to power up with LEs    Ambulation/Gait Ambulation/Gait assistance: Min assist Gait Distance (Feet): 5 Feet Assistive device: Rolling walker (2 wheels) Gait Pattern/deviations: Step-to pattern, Wide base of support       General Gait Details: min assist to steady, a few steps fwd and back; 2-3/4 dyspnea on 3L with SpO2=100%; sats 98-99% on 2L at rest  Stairs            Wheelchair Mobility    Modified Rankin (Stroke Patients Only)       Balance Overall balance assessment: Needs assistance, History of Falls Sitting-balance support: Feet supported, No upper extremity supported Sitting balance-Leahy Scale: Fair     Standing balance support: During functional activity, Reliant on assistive device for balance Standing balance-Leahy Scale: Poor                               Pertinent Vitals/Pain Pain Assessment Pain Assessment: No/denies pain    Home Living Family/patient expects to be discharged to:: Private residence Living Arrangements: Alone;Spouse/significant other Available Help at Discharge: Family Type of Home: House Home Access: Stairs to enter Entrance Stairs-Rails: Right Entrance Stairs-Number of Steps: 2   Home Layout: One level;Laundry  or work area in basement Home Equipment: Agricultural consultant (2 wheels);Cane - single point Additional Comments: laundry room in basement    Prior Function Prior Level of Function : Independent/Modified Independent;Driving             Mobility Comments: amb I'ly, no  device ADLs Comments: husband assists with laundry     Hand Dominance        Extremity/Trunk Assessment   Upper Extremity Assessment Upper Extremity Assessment: Defer to OT evaluation    Lower Extremity Assessment Lower Extremity Assessment: Generalized weakness;RLE deficits/detail;LLE deficits/detail RLE Deficits / Details: anterior knee bruising d/t fall, ROM grossly WFL LLE Deficits / Details: anterior knee bruising d/t fall, ROM grossly WFL       Communication   Communication: No difficulties  Cognition Arousal/Alertness: Awake/alert Behavior During Therapy: WFL for tasks assessed/performed Overall Cognitive Status: Within Functional Limits for tasks assessed                                          General Comments      Exercises     Assessment/Plan    PT Assessment Patient needs continued PT services  PT Problem List Decreased strength;Decreased range of motion;Decreased activity tolerance;Decreased balance;Decreased mobility;Decreased knowledge of use of DME;Decreased knowledge of precautions       PT Treatment Interventions DME instruction;Therapeutic exercise;Gait training;Functional mobility training;Therapeutic activities;Patient/family education    PT Goals (Current goals can be found in the Care Plan section)  Acute Rehab PT Goals Patient Stated Goal: get better PT Goal Formulation: With patient Time For Goal Achievement: 05/13/23 Potential to Achieve Goals: Good    Frequency Min 3X/week     Co-evaluation               AM-PAC PT "6 Clicks" Mobility  Outcome Measure Help needed turning from your back to your side while in a flat bed without using bedrails?: A Little Help needed moving from lying on your back to sitting on the side of a flat bed without using bedrails?: A Little Help needed moving to and from a bed to a chair (including a wheelchair)?: A Little Help needed standing up from a chair using your arms (e.g.,  wheelchair or bedside chair)?: A Little Help needed to walk in hospital room?: A Lot Help needed climbing 3-5 steps with a railing? : Total 6 Click Score: 15    End of Session Equipment Utilized During Treatment: Gait belt Activity Tolerance: Patient tolerated treatment well;Patient limited by fatigue Patient left: in chair;with call bell/phone within reach (no alarm on arrival)   PT Visit Diagnosis: Other abnormalities of gait and mobility (R26.89);Difficulty in walking, not elsewhere classified (R26.2)    Time: 1550-1616 PT Time Calculation (min) (ACUTE ONLY): 26 min   Charges:   PT Evaluation $PT Eval Low Complexity: 1 Low PT Treatments $Therapeutic Activity: 8-22 mins        Delice Bison, PT  Acute Rehab Dept [facility]) [phone]  04/29/2023   [facility] 04/29/2023, 4:23 PM

## 2023-04-29 NOTE — Progress Notes (Signed)
Placed patient on overnight pulse ox on room air. Will continue to monitor patients Sp02 levels keeping greater than 92%.

## 2023-04-30 DIAGNOSIS — D62 Acute posthemorrhagic anemia: Secondary | ICD-10-CM | POA: Diagnosis not present

## 2023-04-30 LAB — CBC
HCT: 27.3 % — ABNORMAL LOW (ref 36.0–46.0)
Hemoglobin: 8.4 g/dL — ABNORMAL LOW (ref 12.0–15.0)
MCH: 31.1 pg (ref 26.0–34.0)
MCHC: 30.8 g/dL (ref 30.0–36.0)
MCV: 101.1 fL — ABNORMAL HIGH (ref 80.0–100.0)
Platelets: 161 10*3/uL (ref 150–400)
RBC: 2.7 MIL/uL — ABNORMAL LOW (ref 3.87–5.11)
RDW: 16.9 % — ABNORMAL HIGH (ref 11.5–15.5)
WBC: 8.3 10*3/uL (ref 4.0–10.5)
nRBC: 0 % (ref 0.0–0.2)

## 2023-04-30 LAB — BASIC METABOLIC PANEL
Anion gap: 5 (ref 5–15)
BUN: <5 mg/dL — ABNORMAL LOW (ref 8–23)
CO2: 32 mmol/L (ref 22–32)
Calcium: 7.8 mg/dL — ABNORMAL LOW (ref 8.9–10.3)
Chloride: 101 mmol/L (ref 98–111)
Creatinine, Ser: 0.63 mg/dL (ref 0.44–1.00)
GFR, Estimated: >60 mL/min (ref >60)
Glucose, Bld: 111 mg/dL — ABNORMAL HIGH (ref 70–99)
Potassium: 3.8 mmol/L (ref 3.5–5.1)
Sodium: 138 mmol/L (ref 135–145)

## 2023-04-30 LAB — MAGNESIUM: Magnesium: 2 mg/dL (ref 1.7–2.4)

## 2023-04-30 LAB — GLUCOSE, CAPILLARY
Glucose-Capillary: 108 mg/dL — ABNORMAL HIGH (ref 70–99)
Glucose-Capillary: 96 mg/dL (ref 70–99)
Glucose-Capillary: 108 mg/dL — ABNORMAL HIGH (ref 70–99)
Glucose-Capillary: 93 mg/dL (ref 70–99)

## 2023-04-30 MED ORDER — FUROSEMIDE 10 MG/ML IJ SOLN
40.0000 mg | Freq: Two times a day (BID) | INTRAMUSCULAR | Status: AC
  Administered 2023-04-30 (×2): 40 mg via INTRAVENOUS
  Filled 2023-04-30 (×2): qty 4

## 2023-04-30 NOTE — Care Management Important Message (Signed)
Important Message  Patient Details IM Letter given. Name: Nicole Peters MRN: [id number] Date of Birth: [birth year]   Medicare Important Message Given:  Yes     Caren Macadam 04/30/2023, 10:32 AM

## 2023-04-30 NOTE — Evaluation (Signed)
Occupational Therapy Evaluation Patient Details Name: Nicole Peters MRN: [id number] DOB: 06-06-1951 Today's Date: 04/30/2023   History of Present Illness 72 y.o. female who presented to [facility] ED on 5/29 via EMS with confusion, weakness, fall, Hgb 4.4; GI consulted. admitted with Upper GI bleed likely secondary to NSAID induced duodenal ulcer ,Acute blood loss anemia and  Hypovolemic shock, hypoxia, renal failure PMH: essential pretension, hyperlipidemia, anxiety/depression, osteoporosis, morbid obesity   Clinical Impression   The pt is currently presenting below her baseline level of functioning for self-care management, currently requiring assist for tasks such as toileting and lower body dressing. She is also noted to be with general deconditioning, new need for supplemental O2, compromised endurance, and slight generalized weakness.  During the session, she was noted to have a significant episode of bowel and urinary incontinence, requiring heavy assist for hygiene/clean-up. Her spouse expressed an interest in short-term SNF rehab for the pt, as he reported having his own physical limitations and he would be unable to assist her if needed at home. OT will continue to follow her for further services in the acute setting to maximize her safety and independence with ADLs.      Recommendations for follow up therapy are one component of a multi-disciplinary discharge planning process, led by the attending physician.  Recommendations may be updated based on patient status, additional functional criteria and insurance authorization.   Assistance Recommended at Discharge Frequent or constant Supervision/Assistance  Patient can return home with the following Assist for transportation;Assistance with cooking/housework;Help with stairs or ramp for entrance;A lot of help with walking and/or transfers    Functional Status Assessment  Patient has had a recent decline in their functional  status and demonstrates the ability to make significant improvements in function in a reasonable and predictable amount of time.  Equipment Recommendations  None recommended by OT       Precautions / Restrictions Precautions Precautions: Fall Restrictions Weight Bearing Restrictions: No      Mobility Bed Mobility Overal bed mobility: Needs Assistance Bed Mobility: Supine to Sit     Supine to sit: Supervision, HOB elevated          Transfers Overall transfer level: Needs assistance Equipment used: Rolling walker (2 wheels) Transfers: Sit to/from Stand Sit to Stand: Min assist          Balance     Sitting balance-Leahy Scale: Good         Standing balance comment: min guard to min assist with RW            ADL either performed or assessed with clinical judgement   ADL Overall ADL's : Needs assistance/impaired Eating/Feeding: Independent;Sitting   Grooming: Set up;Sitting           Upper Body Dressing : Set up;Sitting   Lower Body Dressing: Minimal assistance;Sit to/from stand Lower Body Dressing Details (indicate cue type and reason): She performed sock management in sitting.     Toileting- Architect and Hygiene: Sit to/from stand Toileting - Architect Details (indicate cue type and reason): Pt was noted to be an episode of loose bowel and urinary incontinence; she subsequently required significant physical assist for hygiene clean-up while in standing, as she was unable to safety release the walker in order to perform.             Vision Baseline Vision/History: 1 Wears glasses              Pertinent Vitals/Pain Pain  Assessment Pain Assessment: No/denies pain     Hand Dominance Right   Extremity/Trunk Assessment Upper Extremity Assessment Upper Extremity Assessment: Overall WFL for tasks assessed   Lower Extremity Assessment Lower Extremity Assessment: Generalized weakness       Communication  Communication Communication: No difficulties   Cognition Arousal/Alertness: Awake/alert Behavior During Therapy: WFL for tasks assessed/performed Overall Cognitive Status: Within Functional Limits for tasks assessed                       Home Living Family/patient expects to be discharged to:: Private residence Living Arrangements: Spouse/significant other Available Help at Discharge: Family Type of Home: House Home Access: Stairs to enter Secretary/administrator of Steps: 2 Entrance Stairs-Rails: Right Home Layout: Multi-level/3 stories including basement level;Full bath on main level;Able to live on main level with bedroom/bathroom     Bathroom Shower/Tub: Tub/shower unit         Home Equipment: Agricultural consultant (2 wheels);Cane - single point;Shower seat;BSC/3in1;Transport chair          Prior Functioning/Environment Prior Level of Function : Independent/Modified Independent;Driving             Mobility Comments: She occasionally use a cane or RW for ambulation. ADLs Comments: She was independent with ADLs and driving. Her spouse managed most of the cleaning & they eat out for most meals.        OT Problem List: Decreased strength;Decreased activity tolerance;Impaired balance (sitting and/or standing);Decreased knowledge of use of DME or AE      OT Treatment/Interventions: Self-care/ADL training;Therapeutic exercise;Energy conservation;DME and/or AE instruction;Therapeutic activities;Balance training;Patient/family education    OT Goals(Current goals can be found in the care plan section) Acute Rehab OT Goals Patient Stated Goal: to get better OT Goal Formulation: With patient Time For Goal Achievement: 05/14/23 Potential to Achieve Goals: Good ADL Goals Pt Will Perform Grooming: with supervision;standing Pt Will Perform Lower Body Dressing: with supervision;sit to/from stand Pt Will Transfer to Toilet: with supervision;ambulating Pt Will Perform  Toileting - Clothing Manipulation and hygiene: with supervision;sit to/from stand  OT Frequency: Min 1X/week       AM-PAC OT "6 Clicks" Daily Activity     Outcome Measure Help from another person eating meals?: None Help from another person taking care of personal grooming?: A Little Help from another person toileting, which includes using toliet, bedpan, or urinal?: A Lot Help from another person bathing (including washing, rinsing, drying)?: A Lot Help from another person to put on and taking off regular upper body clothing?: None Help from another person to put on and taking off regular lower body clothing?: A Little 6 Click Score: 18   End of Session Equipment Utilized During Treatment: Gait belt;Rolling walker (2 wheels) Nurse Communication: Mobility status  Activity Tolerance: Patient tolerated treatment well Patient left: in chair;with call bell/phone within reach;with family/visitor present  OT Visit Diagnosis: Unsteadiness on feet (R26.81);Muscle weakness (generalized) (M62.81)                Time: 1610-9604 OT Time Calculation (min): 37 min Charges:  OT General Charges $OT Visit: 1 Visit OT Evaluation $OT Eval Moderate Complexity: 1 Mod OT Treatments $Self Care/Home Management : 8-22 mins   Reuben Likes, OTR/L 04/30/2023, 12:54 PM

## 2023-04-30 NOTE — Progress Notes (Signed)
PROGRESS NOTE    Nicole Peters  ZOX:[id number] DOB: [birth year] DOA: 04/21/2023 PCP: Lezlie Lye, Meda Coffee, MD    Brief Narrative:   Nicole Peters is a 72 y.o. female with past medical history significant for essential pretension, hyperlipidemia, anxiety/depression, osteoporosis, morbid obesity who presented to [facility] ED on 5/29 via EMS with confusion, poor appetite.  Patient with reported fall at home 3 days prior.  On EMS arrival, patient with some confusion and while trying to stand up she experienced a syncopal episode.  Workup in the ED notable for sodium of 128, CO2 15, BUN 115, creatinine 3.05, lactic acid 8.8, WBC 55.6, hemoglobin 4.4.  EDP consulted PCCM and patient was admitted to the intensive care unit under the pulmonary critical care service.  Significant Hospital events: 5/29: Admitted to the PCCM service, hemoglobin 4.4 5/30: Started on low-dose vasopressors, confusion increased, transfusing additional PRBC.  CTA GI given transition to brisk melena with findings of active extravasation from mid gastroduodenal artery, noted transient atrial fibrillation 5/31: IR consulted underwent GDA embolization, intubated postprocedure 6/1: Extubated, vasopressors weaned off 6/4: Transferred to the hospitalist service 6/5: EGD with large duodenal ulcer, nonbleeding 6/6: Transfer to medical floor 6/7: PT recommends home health, OT recommends SNF, Lasix IV x 2 today  Assessment & Plan:   Upper GI bleed likely secondary to NSAID induced duodenal ulcer Acute blood loss anemia Hypovolemic shock Patient presenting to ED with confusion, syncopal episode and was found to have a hemoglobin of 4.4.  Etiology likely secondary to NSAID induced duodenal ulcer.  During the hospitalization; bleeding had become more brisk and underwent CT angiogram GI study with noted active extravasation from mid gastroduodenal artery.  Patient did require vasopressor support due to significant blood  loss which was eventually titrated off.  Patient was transfused 2 unit PRBCs on 5/29, 3 units PRBCs on 5/30, 1 unit pRBC and 2 units cryoprecipitate 5/31, 1 unit PRBC 6/2.  Interventional radiology was consulted and patient underwent GDA embolization on 5/31.  GI was consulted and patient underwent EGD on 6/5 with findings of large 2 cm deep with nonbleeding cratered duodenal ulcer with coil first portion of the duodenum with surrounding erythema/edema. -- [facility] following; appreciate assistance -- Hgb 4.4>>7.8>6.2>>8.7>>6.7>7.5>7.5>8.3>8.4 -- Protonix 40 mg IV every 12 hours (plan BID dosing on discharge for 12 weeks followed by once daily thereafter) -- Avoidance of NSAIDs x 12 weeks, no aspirin for 4 weeks -- CBC daily  Leukocytosis No infectious etiology elucidated.  C. difficile and GI PCR panel negative.  Blood cultures x 2 from 5/29 with no growth.  Completed 6-day course of IV antibiotics. -- WBC 55.6>48.9>>14.9>11.6>10.4>8.3 -- CBC daily  Acute metabolic encephalopathy: Resolved Etiology likely multifactorial in the setting of acute blood loss anemia secondary to upper GI bleed, uremia secondary to renal failure with acidosis.  -- Continue to hold home Xanax for now  Acute renal failure Etiology likely secondary to prerenal azotemia in the setting of poor oral intake in days preceding hospitalization versus ATN from significant blood loss anemia as above requiring vasopressor support initially while in the intensive care unit.  Renal ultrasound with no acute findings. -- Cr 3.05>>>0.76>0.70>0.63 -- BMP daily  Acute hypoxic respiratory failure Suspect etiology likely related to blood loss and significant volume overload with IV fluids/blood resuscitation during hospitalization.  Received IV Lasix with improvement.  Chest x-ray 6/6 with no active cardiopulmonary disease process. -- Incentive spirometry -- Continue to wean supplemental oxygen to maintain  SpO2 >  92%  Hypoglycemia -- Decrease D10 to 50 mL/h; will continue to wean/titrate down once able to start diet  Hypokalemia Repleted, potassium 3.8 this morning. -- BMP in a.m. with magnesium  Hypothyroidism TSH 15.847, free T4 0.72. -- Levothyroxine 25 mcg p.o. daily  Paroxysmal atrial fibrillation Essential hypertension Home medications include benazepril 20 mg p.o. daily, amlodipine 5 mg p.o. daily.  Patient did require vasopressor support during initial hospitalization due to hypovolemic shock as above. -- BP 108/46 this morning -- Continue to hold home antihypertensives -- Anticoagulation contraindicated given GI bleed as above -- Monitor on telemetry  Hyperlipidemia -- Simvastatin 80 mg p.o. daily  Anxiety/depression On Xanax as needed at baseline.  Currently on hold due to encephalopathy as above.  Weakness/debility/deconditioning: -- PT recommending home health -- OT recommending SNF -- Continue therapy efforts while inpatient    DVT prophylaxis: SCDs Start: 04/21/23 2120    Code Status: Full Code Family Communication: No family present at bedside this morning  Disposition Plan:  Level of care: Med-Surg Status is: Inpatient Remains inpatient appropriate because: Pending EGD today    Consultants:  PCCM: Signed off 6/4 [place] [facility] Interventional radiology  Procedures:  Renal u/s 5/30 >> no acute findings CT angio GI bleed 5/30 >> active extravasation from mid-gastroduodenal artery, coronary calcification, small HH, active hemorrhage from 2nd portion of duodenum, severe sigmoid diverticulosis  Antimicrobials:  Vancomycin 5/29 - 5/29 Cefepime 5/29 - 6/1 Zosyn 6/2 - 6/3 Metronidazole 5/29 - 6/1   Subjective: Patient seen examined bedside, resting comfortably in bed.  No specific complaints.  Placed on oxygen overnight, concern for OSA.  Nocturnal O2 study pending.  PT recommending home health, only ambulated 5 feet with them yesterday.  OT  recommending SNF today.  Denies headache, no visual changes, no chest pain, no palpitations, no shortness of breath, no abdominal pain, no fever, no chills, no nausea/vomiting/diarrhea.  No acute events overnight per nursing staff.  Objective: Vitals:   04/29/23 1801 04/29/23 2234 04/30/23 0500 04/30/23 0553  BP: 135/65 (!) 142/67  128/64  Pulse: 77 79  84  Resp:  17  17  Temp: 98.2 F (36.8 C) 98 F (36.7 C)  98.3 F (36.8 C)  TempSrc: Oral Oral  Oral  SpO2: 98% 98%  94%  Weight:   103 kg   Height:        Intake/Output Summary (Last 24 hours) at 04/30/2023 1318 Last data filed at 04/30/2023 1249 Gross per 24 hour  Intake --  Output 2050 ml  Net -2050 ml   Filed Weights   04/28/23 0500 04/29/23 0500 04/30/23 0500  Weight: 104.3 kg 103.1 kg 103 kg    Examination:  Physical Exam: GEN: NAD, alert and oriented x 3, obese HEENT: NCAT, PERRL, EOMI, sclera clear, MMM PULM: CTAB w/o wheezes/crackles, normal respiratory effort, on 2L nasal cannula CV: RRR w/o M/G/R GI: abd soft, NTND, NABS, no R/G/M GU: Foley catheter noted in place with clear yellow urine in collection canister MSK: no peripheral edema, moves all extremities independently NEURO: CN II-XII intact, no focal deficits, sensation to light touch intact PSYCH: normal mood/affect Integumentary: Ecchymosis noted to bilateral malar surfaces and nasal bridge, otherwise no other concerning rashes/lesions/wounds noted exposed skin surfaces.      Data Reviewed: I have personally reviewed following labs and imaging studies  CBC: Recent Labs  Lab 04/25/23 0414 04/25/23 1750 04/26/23 0500 04/27/23 0338 04/28/23 0624 04/29/23 0408 04/30/23 0540  WBC 25.0*  --  19.0* 14.9* 11.6* 10.4 8.3  NEUTROABS 20.3*  --   --   --   --   --   --   HGB 7.0*   < > 7.5* 7.5* 8.3* 8.5* 8.4*  HCT 21.8*   < > 23.1* 23.3* 26.5* 27.6* 27.3*  MCV 96.5  --  96.7 97.5 98.9 101.1* 101.1*  PLT 81*  --  88* 101* 122* 152 161   < > = values in  this interval not displayed.   Basic Metabolic Panel: Recent Labs  Lab 04/25/23 0414 04/26/23 0500 04/26/23 0803 04/27/23 0338 04/28/23 0624 04/29/23 0408 04/30/23 0540  NA 139 134* 136 136 136 137 138  K 3.7 3.7 3.6 3.1* 3.5 3.5 3.8  CL 112* 106 107 101 101 101 101  CO2 24 25 25 29 31 30  32  GLUCOSE 77 100* 95 117* 96 114* 111*  BUN 26* 15 16 12  7* 7* <5*  CREATININE 0.91 0.88 0.98 0.95 0.76 0.70 0.63  CALCIUM 7.6* 7.2* 7.3* 7.1* 7.4* 7.5* 7.8*  MG 2.0  --   --  1.7  --  2.1 2.0  PHOS 2.3* 2.5  --   --   --   --   --    GFR: Estimated Creatinine Clearance: 74.3 mL/min (by C-G formula based on SCr of 0.63 mg/dL). Liver Function Tests: Recent Labs  Lab 04/24/23 0406 04/26/23 0500  AST 124* 64*  ALT 226* 128*  ALKPHOS 28* 24*  BILITOT 0.8 1.3*  PROT 3.7* 3.6*  ALBUMIN 2.0* 1.8*   No results for input(s): "LIPASE", "AMYLASE" in the last 168 hours.  Recent Labs  Lab 04/24/23 0405  AMMONIA 20   Coagulation Profile: Recent Labs  Lab 04/23/23 1812 04/23/23 2143 04/24/23 0406  INR 1.2 1.2 1.3*   Cardiac Enzymes: No results for input(s): "CKTOTAL", "CKMB", "CKMBINDEX", "TROPONINI" in the last 168 hours. BNP (last 3 results) No results for input(s): "PROBNP" in the last 8760 hours. HbA1C: No results for input(s): "HGBA1C" in the last 72 hours. CBG: Recent Labs  Lab 04/29/23 0405 04/29/23 0423 04/29/23 0755 04/30/23 0738 04/30/23 1200  GLUCAP 107* 96 92 108* 96   Lipid Profile: No results for input(s): "CHOL", "HDL", "LDLCALC", "TRIG", "CHOLHDL", "LDLDIRECT" in the last 72 hours. Thyroid Function Tests: No results for input(s): "TSH", "T4TOTAL", "FREET4", "T3FREE", "THYROIDAB" in the last 72 hours.  Anemia Panel: No results for input(s): "VITAMINB12", "FOLATE", "FERRITIN", "TIBC", "IRON", "RETICCTPCT" in the last 72 hours.  Sepsis Labs: Recent Labs  Lab 04/26/23 0803  PROCALCITON 0.46    Recent Results (from the past 240 hour(s))  Culture,  blood (routine x 2)     Status: None   Collection Time: 04/21/23  6:45 PM   Specimen: Left Antecubital; Blood  Result Value Ref Range Status   Specimen Description   Final    LEFT ANTECUBITAL BLOOD Performed at [facility], [address] [place], [place] [address]    Special Requests   Final    BOTTLES DRAWN AEROBIC AND ANAEROBIC Blood Culture results may not be optimal due to an excessive volume of blood received in culture bottles Performed at [facility], [address], [place], [place] [address]    Culture   Final    NO GROWTH 5 DAYS Performed at [facility], [address], [place], [place] [address]    Report Status 04/26/2023 FINAL  Final  Resp panel by RT-PCR (RSV, Flu A&B, Covid) Anterior Nasal  Swab     Status: None   Collection Time: 04/21/23  8:12 PM   Specimen: Anterior Nasal Swab  Result Value Ref Range Status   SARS Coronavirus 2 by RT PCR NEGATIVE NEGATIVE Final    Comment: (NOTE) SARS-CoV-2 target nucleic acids are NOT DETECTED.  The SARS-CoV-2 RNA is generally detectable in upper respiratory specimens during the acute phase of infection. The lowest concentration of SARS-CoV-2 viral copies this assay can detect is 138 copies/mL. A negative result does not preclude SARS-Cov-2 infection and should not be used as the sole basis for treatment or other patient management decisions. A negative result may occur with  improper specimen collection/handling, submission of specimen other than nasopharyngeal swab, presence of viral mutation(s) within the areas targeted by this assay, and inadequate number of viral copies(<138 copies/mL). A negative result must be combined with clinical observations, patient history, and epidemiological information. The expected result is Negative.  Fact Sheet for Patients:  BloggerCourse.com  Fact Sheet for Healthcare Providers:   SeriousBroker.it  This test is no t yet approved or cleared by the [place] FDA and  has been authorized for detection and/or diagnosis of SARS-CoV-2 by FDA under an Emergency Use Authorization (EUA). This EUA will remain  in effect (meaning this test can be used) for the duration of the COVID-19 declaration under Section 564(b)(1) of the Act, 21 U.S.C.section 360bbb-3(b)(1), unless the authorization is terminated  or revoked sooner.       Influenza A by PCR NEGATIVE NEGATIVE Final   Influenza B by PCR NEGATIVE NEGATIVE Final    Comment: (NOTE) The Xpert Xpress SARS-CoV-2/FLU/RSV plus assay is intended as an aid in the diagnosis of influenza from Nasopharyngeal swab specimens and should not be used as a sole basis for treatment. Nasal washings and aspirates are unacceptable for Xpert Xpress SARS-CoV-2/FLU/RSV testing.  Fact Sheet for Patients: BloggerCourse.com  Fact Sheet for Healthcare Providers: SeriousBroker.it  This test is not yet approved or cleared by the [place] FDA and has been authorized for detection and/or diagnosis of SARS-CoV-2 by FDA under an Emergency Use Authorization (EUA). This EUA will remain in effect (meaning this test can be used) for the duration of the COVID-19 declaration under Section 564(b)(1) of the Act, 21 U.S.C. section 360bbb-3(b)(1), unless the authorization is terminated or revoked.     Resp Syncytial Virus by PCR NEGATIVE NEGATIVE Final    Comment: (NOTE) Fact Sheet for Patients: BloggerCourse.com  Fact Sheet for Healthcare Providers: SeriousBroker.it  This test is not yet approved or cleared by the [place] FDA and has been authorized for detection and/or diagnosis of SARS-CoV-2 by FDA under an Emergency Use Authorization (EUA). This EUA will remain in effect (meaning this test can be used) for  the duration of the COVID-19 declaration under Section 564(b)(1) of the Act, 21 U.S.C. section 360bbb-3(b)(1), unless the authorization is terminated or revoked.  Performed at [facility], [address], [place], [place] [address]   Culture, blood (routine x 2)     Status: None   Collection Time: 04/21/23  8:19 PM   Specimen: BLOOD  Result Value Ref Range Status   Specimen Description   Final    BLOOD RIGHT ANTECUBITAL Performed at [facility], [address] [place], [place] [address]    Special Requests   Final    BOTTLES DRAWN AEROBIC AND ANAEROBIC Blood Culture adequate volume Performed at [facility], [address], [place], [place] [address]  Culture   Final    NO GROWTH 5 DAYS Performed at [facility], [address], [place], [place] [address]    Report Status 04/26/2023 FINAL  Final  Urine Culture (for pregnant, neutropenic or urologic patients or patients with an indwelling urinary catheter)     Status: None   Collection Time: 04/21/23  8:36 PM   Specimen: Urine, Clean Catch  Result Value Ref Range Status   Specimen Description   Final    URINE, CLEAN CATCH Performed at [facility], [address], [place], [place] [address]    Special Requests   Final    NONE Performed at [facility], [address], [place], [place] [address]    Culture   Final    NO GROWTH Performed at [facility], [address], [place], [place] [address]    Report Status 04/22/2023 FINAL  Final  C Difficile Quick Screen w PCR reflex     Status: None   Collection Time: 04/22/23  4:48 PM   Specimen: STOOL  Result Value Ref Range Status   C Diff antigen NEGATIVE NEGATIVE Final   C Diff toxin NEGATIVE NEGATIVE Final   C Diff interpretation No C. difficile detected.  Final    Comment: Performed at [facility], [address] [place], [place] [address]   Gastrointestinal Panel by PCR , Stool     Status: None   Collection Time: 04/22/23  7:29 PM   Specimen: Stool  Result Value Ref Range Status   Campylobacter species NOT DETECTED NOT DETECTED Final   Plesimonas shigelloides NOT DETECTED NOT DETECTED Final   Salmonella species NOT DETECTED NOT DETECTED Final   Yersinia enterocolitica NOT DETECTED NOT DETECTED Final   Vibrio species NOT DETECTED NOT DETECTED Final   Vibrio cholerae NOT DETECTED NOT DETECTED Final   Enteroaggregative E coli (EAEC) NOT DETECTED NOT DETECTED Final   Enteropathogenic E coli (EPEC) NOT DETECTED NOT DETECTED Final   Enterotoxigenic E coli (ETEC) NOT DETECTED NOT DETECTED Final   Shiga like toxin producing E coli (STEC) NOT DETECTED NOT DETECTED Final   Shigella/Enteroinvasive E coli (EIEC) NOT DETECTED NOT DETECTED Final   Cryptosporidium NOT DETECTED NOT DETECTED Final   Cyclospora cayetanensis NOT DETECTED NOT DETECTED Final   Entamoeba histolytica NOT DETECTED NOT DETECTED Final   Giardia lamblia NOT DETECTED NOT DETECTED Final   Adenovirus F40/41 NOT DETECTED NOT DETECTED Final   Astrovirus NOT DETECTED NOT DETECTED Final   Norovirus GI/GII NOT DETECTED NOT DETECTED Final   Rotavirus A NOT DETECTED NOT DETECTED Final   Sapovirus (I, II, IV, and V) NOT DETECTED NOT DETECTED Final    Comment: Performed at [facility], [address], [place], [place] [address]         Radiology Studies: DG CHEST PORT 1 VIEW  Result Date: 04/29/2023 CLINICAL DATA:  Shortness of breath EXAM: PORTABLE CHEST 1 VIEW COMPARISON:  04/26/2023 FINDINGS: Check shadow is stable. Aortic calcifications are noted. Right PICC is noted in satisfactory position. Lungs are clear. No bony abnormality is noted. IMPRESSION: No active disease. Electronically Signed   By: Alcide Clever M.D.   On: 04/29/2023 02:54        Scheduled Meds:  sodium chloride   Intravenous Once   Chlorhexidine Gluconate Cloth  6 each Topical Daily    feeding supplement  237 mL Oral BID BM   furosemide  40 mg Intravenous Q12H  levothyroxine  75 mcg Oral Q0600   loratadine  10 mg Oral Daily   midodrine  2.5 mg Oral TID WC   montelukast  10 mg Oral QHS   mouth rinse  15 mL Mouth Rinse 4 times per day   pantoprazole (PROTONIX) IV  40 mg Intravenous Q12H   sodium chloride flush  10-40 mL Intracatheter Q12H   Continuous Infusions:  sodium chloride Stopped (04/23/23 2320)     LOS: 9 days    Time spent: 52 minutes spent on chart review, discussion with nursing staff, consultants, updating family and interview/physical exam; more than 50% of that time was spent in counseling and/or coordination of care.    Alvira Philips [place], DO [facility] Available via Epic secure chat 7am-7pm After these hours, please refer to coverage provider listed on [email or web address] 04/30/2023, 1:18 PM

## 2023-04-30 NOTE — Progress Notes (Signed)
Placed patient on 2lpm due to decrease in Sp02 levels. Patient c/o alarm noise.

## 2023-04-30 NOTE — TOC Progression Note (Addendum)
Transition of Care [facility]) - Progression Note    Patient Details  Name: Nicole Peters MRN: [id number] Date of Birth: May 14, 1951  [facility] CM/SW Contact  Beckie Busing, RN Phone Number:[phone]  04/30/2023, 2:00 PM  Clinical Narrative:    [facility] acknowledges consult for [facility] referral. CM at bedside introduces self and explains role / reason for consult. Patient and husband both state that patient is not ready to go home. Spouse states that he is concerned about going home with home health because patient has frequent falls and spouse states that he is partially  wheelchair bound so he is unable to assist when she falls. Spouse want to be clear that patient will have limited assist at home. Spouse is requesting that patient be reevaluated by therapy to make sure d/c plans are appropriate for her to receive the best care. CM has sent a message to the PT dept. TOC will continue to follow.   CM offered patient choice for home health services.Patient has no preference as long as insurance will pay. HH referral has been called to [facility] with [facility] to follow for possible home health. [facility] has accepted with the understanding that patient may go to SNF for rehab.   Expected Discharge Plan:  (unknown at this time) Barriers to Discharge: Continued Medical Work up  Expected Discharge Plan and Services In-house Referral: NA Discharge Planning Services: CM Consult Post Acute Care Choice: NA Living arrangements for the past 2 months: Single Family Home                 DME Arranged: N/A DME Agency: NA       HH Arranged: NA HH Agency: NA         Social Determinants of Health (SDOH) Interventions SDOH Screenings   Housing: Patient Unable To Answer (04/22/2023)  Depression (PHQ2-9): Low Risk  (09/16/2019)  Tobacco Use: Medium Risk (04/28/2023)    Readmission Risk Interventions    04/27/2023   11:57 AM 04/22/2023    5:42 PM  Readmission Risk Prevention Plan  Post Dischage Appt  Complete   Transportation Screening  Complete  PCP or Specialist Appt within 5-7 Days  Complete  Home Care Screening  Complete  Medication Review (RN CM)  Complete

## 2023-04-30 NOTE — Progress Notes (Signed)
Pt dropping to o2 sat below 85 on RA.  1L added, O2 sat return to normal rage 92-97%.  Respiratory made aware, will cont to monitor.

## 2023-04-30 NOTE — Progress Notes (Signed)
Physical Therapy Treatment Patient Details Name: Nicole Peters MRN: [id number] DOB: November 26, 1950 Today's Date: 04/30/2023   History of Present Illness 72 y.o. female who presented to [facility] ED on 5/29 via EMS with confusion, weakness, fall, Hgb 4.4.  Pt admitted with upper GI bleed likely secondary to NSAID induced duodenal ulcer, acute blood loss anemia, and hypovolemic shock. On 5/31 IR consulted and underwent GDA embolization and intubated 04/23/23-04/24/23 post procedure.  PMH: essential pretension, hyperlipidemia, anxiety/depression, osteoporosis, morbid obesity    PT Comments    Pt agreeable to PT but reports fatigued and cold.  She was able to participate with multiple transfers and ambulated 5'x2 for transfers. She required min-mod A for transfers, fatigued easily, and required assist for ADLs.  Pt is normally completely independent.  Spouse present and reports concern that he is unable to assist her at d/c due to his physical disabilities.  Will update d/c recommendation for post acute rehab needs due to slower progress and new information that spouse unable to assist.      Recommendations for follow up therapy are one component of a multi-disciplinary discharge planning process, led by the attending physician.  Recommendations may be updated based on patient status, additional functional criteria and insurance authorization.  Follow Up Recommendations  Can patient physically be transported by private vehicle: Yes    Assistance Recommended at Discharge Intermittent Supervision/Assistance  Patient can return home with the following A little help with walking and/or transfers;A little help with bathing/dressing/bathroom;Assistance with cooking/housework;Help with stairs or ramp for entrance   Equipment Recommendations  None recommended by PT    Recommendations for Other Services       Precautions / Restrictions Precautions Precautions: Fall     Mobility  Bed  Mobility Overal bed mobility: Needs Assistance Bed Mobility: Supine to Sit, Sit to Supine     Supine to sit: Mod assist, HOB elevated Sit to supine: Min assist, HOB elevated   General bed mobility comments: Increased time with assist to elevate trunk    Transfers Overall transfer level: Needs assistance Equipment used: Rolling walker (2 wheels) Transfers: Sit to/from Stand, Bed to chair/wheelchair/BSC Sit to Stand: Min assist   Step pivot transfers: Min assist       General transfer comment: Cues for hand placement with min A to rise; performed x 2; step pivot bed to chair and back to bed after sheets changed    Ambulation/Gait Ambulation/Gait assistance: Min assist Gait Distance (Feet): 5 Feet Assistive device: Rolling walker (2 wheels) Gait Pattern/deviations: Step-to pattern, Wide base of support Gait velocity: decreased     General Gait Details: fatigued easily   Stairs             Wheelchair Mobility    Modified Rankin (Stroke Patients Only)       Balance Overall balance assessment: Needs assistance, History of Falls Sitting-balance support: Feet supported, No upper extremity supported Sitting balance-Leahy Scale: Good     Standing balance support: During functional activity, Reliant on assistive device for balance Standing balance-Leahy Scale: Poor Standing balance comment: min guard to min assist with RW                            Cognition Arousal/Alertness: Awake/alert Behavior During Therapy: WFL for tasks assessed/performed Overall Cognitive Status: Within Functional Limits for tasks assessed  General Comments: Overall functional but sometimes increased time to respond        Exercises      General Comments General comments (skin integrity, edema, etc.): Pt's purewick not working (vacuum cap not on, PT able to correct).  Pt required assist in standing to perform ADLs.  Nursing  into assist with bed change.  Encouraged pt to increase activity as abile with nursing staff.  Encouraged use of bathroom or bsc during the day as able.   During eval pt on 2 L O2 with sats 96%      Pertinent Vitals/Pain Pain Assessment Pain Assessment: No/denies pain    Home Living                          Prior Function            PT Goals (current goals can now be found in the care plan section) Progress towards PT goals: Progressing toward goals    Frequency    Min 1X/week      PT Plan Discharge plan needs to be updated    Co-evaluation              AM-PAC PT "6 Clicks" Mobility   Outcome Measure  Help needed turning from your back to your side while in a flat bed without using bedrails?: A Little Help needed moving from lying on your back to sitting on the side of a flat bed without using bedrails?: A Lot Help needed moving to and from a bed to a chair (including a wheelchair)?: A Little Help needed standing up from a chair using your arms (e.g., wheelchair or bedside chair)?: A Little Help needed to walk in hospital room?: Total Help needed climbing 3-5 steps with a railing? : Total 6 Click Score: 13    End of Session Equipment Utilized During Treatment: Gait belt Activity Tolerance: Patient limited by fatigue Patient left: in bed;with call bell/phone within reach;with bed alarm set Nurse Communication: Mobility status PT Visit Diagnosis: Other abnormalities of gait and mobility (R26.89);Difficulty in walking, not elsewhere classified (R26.2)     Time: 4782-9562 PT Time Calculation (min) (ACUTE ONLY): 26 min  Charges:  $Therapeutic Activity: 23-37 mins                     Anise Salvo, PT Acute Rehab [facility] [phone]    Nicole Peters 04/30/2023, 3:42 PM

## 2023-05-01 DIAGNOSIS — D62 Acute posthemorrhagic anemia: Secondary | ICD-10-CM | POA: Diagnosis not present

## 2023-05-01 LAB — BASIC METABOLIC PANEL
Anion gap: 10 (ref 5–15)
BUN: <5 mg/dL — ABNORMAL LOW (ref 8–23)
CO2: 34 mmol/L — ABNORMAL HIGH (ref 22–32)
Calcium: 7.5 mg/dL — ABNORMAL LOW (ref 8.9–10.3)
Chloride: 93 mmol/L — ABNORMAL LOW (ref 98–111)
Creatinine, Ser: 0.78 mg/dL (ref 0.44–1.00)
GFR, Estimated: >60 mL/min (ref >60)
Glucose, Bld: 76 mg/dL (ref 70–99)
Potassium: 3.3 mmol/L — ABNORMAL LOW (ref 3.5–5.1)
Sodium: 137 mmol/L (ref 135–145)

## 2023-05-01 LAB — CBC
HCT: 26.4 % — ABNORMAL LOW (ref 36.0–46.0)
Hemoglobin: 8.2 g/dL — ABNORMAL LOW (ref 12.0–15.0)
MCH: 31.1 pg (ref 26.0–34.0)
MCHC: 31.1 g/dL (ref 30.0–36.0)
MCV: 100 fL (ref 80.0–100.0)
Platelets: 171 10*3/uL (ref 150–400)
RBC: 2.64 MIL/uL — ABNORMAL LOW (ref 3.87–5.11)
RDW: 16.4 % — ABNORMAL HIGH (ref 11.5–15.5)
WBC: 7.9 10*3/uL (ref 4.0–10.5)
nRBC: 0 % (ref 0.0–0.2)

## 2023-05-01 LAB — GLUCOSE, CAPILLARY
Glucose-Capillary: 79 mg/dL (ref 70–99)
Glucose-Capillary: 105 mg/dL — ABNORMAL HIGH (ref 70–99)
Glucose-Capillary: 126 mg/dL — ABNORMAL HIGH (ref 70–99)

## 2023-05-01 LAB — MAGNESIUM: Magnesium: 1.8 mg/dL (ref 1.7–2.4)

## 2023-05-01 MED ORDER — POTASSIUM CHLORIDE CRYS ER 20 MEQ PO TBCR
30.0000 meq | EXTENDED_RELEASE_TABLET | ORAL | Status: AC
  Administered 2023-05-01 (×2): 30 meq via ORAL
  Filled 2023-05-01 (×2): qty 1

## 2023-05-01 MED ORDER — PANTOPRAZOLE SODIUM 40 MG PO TBEC
40.0000 mg | DELAYED_RELEASE_TABLET | Freq: Two times a day (BID) | ORAL | Status: DC
  Administered 2023-05-01 – 2023-05-05 (×9): 40 mg via ORAL
  Filled 2023-05-01 (×9): qty 1

## 2023-05-01 MED ORDER — MAGNESIUM SULFATE 2 GM/50ML IV SOLN
2.0000 g | Freq: Once | INTRAVENOUS | Status: AC
  Administered 2023-05-01: 2 g via INTRAVENOUS
  Filled 2023-05-01: qty 50

## 2023-05-01 NOTE — Progress Notes (Signed)
PROGRESS NOTE    QUINN RUPEL  WUJ:[id number] DOB: Feb 11, 1951 DOA: 04/21/2023 PCP: Lezlie Lye, Meda Coffee, MD    Brief Narrative:   Nicole Peters is a 72 y.o. female with past medical history significant for essential pretension, hyperlipidemia, anxiety/depression, osteoporosis, morbid obesity who presented to [facility] ED on 5/29 via EMS with confusion, poor appetite.  Patient with reported fall at home 3 days prior.  On EMS arrival, patient with some confusion and while trying to stand up she experienced a syncopal episode.  Workup in the ED notable for sodium of 128, CO2 15, BUN 115, creatinine 3.05, lactic acid 8.8, WBC 55.6, hemoglobin 4.4.  EDP consulted PCCM and patient was admitted to the intensive care unit under the pulmonary critical care service.  Significant Hospital events: 5/29: Admitted to the PCCM service, hemoglobin 4.4 5/30: Started on low-dose vasopressors, confusion increased, transfusing additional PRBC.  CTA GI given transition to brisk melena with findings of active extravasation from mid gastroduodenal artery, noted transient atrial fibrillation 5/31: IR consulted underwent GDA embolization, intubated postprocedure 6/1: Extubated, vasopressors weaned off 6/4: Transferred to the hospitalist service 6/5: EGD with large duodenal ulcer, nonbleeding 6/6: Transfer to medical floor 6/7: PT recommends home health, OT recommends SNF, Lasix IV x 2 today  Assessment & Plan:   Upper GI bleed likely secondary to NSAID induced duodenal ulcer Acute blood loss anemia Hypovolemic shock Patient presenting to ED with confusion, syncopal episode and was found to have a hemoglobin of 4.4.  Etiology likely secondary to NSAID induced duodenal ulcer.  During the hospitalization; bleeding had become more brisk and underwent CT angiogram GI study with noted active extravasation from mid gastroduodenal artery.  Patient did require vasopressor support due to significant blood  loss which was eventually titrated off.  Patient was transfused 2 unit PRBCs on 5/29, 3 units PRBCs on 5/30, 1 unit pRBC and 2 units cryoprecipitate 5/31, 1 unit PRBC 6/2.  Interventional radiology was consulted and patient underwent GDA embolization on 5/31.  GI was consulted and patient underwent EGD on 6/5 with findings of large 2 cm deep with nonbleeding cratered duodenal ulcer with coil first portion of the duodenum with surrounding erythema/edema. -- [facility] following; appreciate assistance -- Hgb 4.4>>7.8>6.2>>8.7>>6.7>7.5>7.5>8.3>8.4>8.2 -- Protonix 40 mg PO q12h (plan BID dosing on discharge for 12 weeks followed by once daily thereafter) -- Avoidance of NSAIDs x 12 weeks, no aspirin for 4 weeks  Leukocytosis No infectious etiology elucidated.  C. difficile and GI PCR panel negative.  Blood cultures x 2 from 5/29 with no growth.  Completed 6-day course of IV antibiotics. -- WBC 55.6>48.9>>14.9>11.6>10.4>8.3>7.9  Acute metabolic encephalopathy: Resolved Etiology likely multifactorial in the setting of acute blood loss anemia secondary to upper GI bleed, uremia secondary to renal failure with acidosis.  -- Continue to hold home Xanax for now  Acute renal failure Etiology likely secondary to prerenal azotemia in the setting of poor oral intake in days preceding hospitalization versus ATN from significant blood loss anemia as above requiring vasopressor support initially while in the intensive care unit.  Renal ultrasound with no acute findings. -- Cr 3.05>>>0.76>0.70>0.63>0.78  Acute hypoxic respiratory failure Suspect etiology likely related to blood loss and significant volume overload with IV fluids/blood resuscitation during hospitalization.  Received IV Lasix with improvement.  Chest x-ray 6/6 with no active cardiopulmonary disease process. -- Incentive spirometry -- Continue to wean supplemental oxygen to maintain SpO2 > 92%  Hypoglycemia: Resolved D10 drip discontinued on  04/30/2023.  Hypokalemia Potassium 3.3 this morning, will replete.  Hypothyroidism TSH 15.847, free T4 0.72. -- Levothyroxine 25 mcg p.o. daily  Paroxysmal atrial fibrillation Essential hypertension Home medications include benazepril 20 mg p.o. daily, amlodipine 5 mg p.o. daily.  Patient did require vasopressor support during initial hospitalization due to hypovolemic shock as above. -- BP 156/73 this morning; slowly uptrending -- Continue to hold home antihypertensives; if blood pressure continues to uptrend will consider restarting amlodipine -- Anticoagulation contraindicated given GI bleed as above -- Monitor on telemetry  Hyperlipidemia -- Simvastatin 80 mg p.o. daily  Anxiety/depression On Xanax as needed at baseline.  Currently on hold due to encephalopathy as above.  Weakness/debility/deconditioning: -- PT/OT recommending SNF -- [facility] consulted for SNF placement    DVT prophylaxis: SCDs Start: 04/21/23 2120    Code Status: Full Code Family Communication: No family present at bedside this morning  Disposition Plan:  Level of care: Med-Surg Status is: Inpatient Remains inpatient appropriate because: Pending EGD today    Consultants:  PCCM: Signed off 6/4 Center gastroenterology Interventional radiology  Procedures:  Renal u/s 5/30 >> no acute findings CT angio GI bleed 5/30 >> active extravasation from mid-gastroduodenal artery, coronary calcification, small HH, active hemorrhage from 2nd portion of duodenum, severe sigmoid diverticulosis  Antimicrobials:  Vancomycin 5/29 - 5/29 Cefepime 5/29 - 6/1 Zosyn 6/2 - 6/3 Metronidazole 5/29 - 6/1   Subjective: Patient seen examined bedside, resting comfortably in bed.  No specific complaints.  Repeat PT evaluation now stating need for SNF placement which is in agreement with OT evaluation.  Husband unable to support much at home as he is partially wheelchair dependent.  Discussed with patient need for further  rehabilitation given her significant hospital course, and in agreement.  No other questions or concerns at this time.  Denies headache, no visual changes, no chest pain, no palpitations, no shortness of breath, no abdominal pain, no fever, no chills, no nausea/vomiting/diarrhea.  No acute events overnight per nursing staff.  Objective: Vitals:   04/30/23 1403 04/30/23 2142 05/01/23 0500 05/01/23 0636  BP: (!) 104/56 (!) 141/72  (!) 156/73  Pulse: 80 71  83  Resp: 16 18  17   Temp: 97.8 F (36.6 C) 98.4 F (36.9 C)  98.1 F (36.7 C)  TempSrc: Oral Oral  Oral  SpO2: 100% 97%  99%  Weight:   103.1 kg   Height:        Intake/Output Summary (Last 24 hours) at 05/01/2023 1221 Last data filed at 04/30/2023 1638 Gross per 24 hour  Intake --  Output 1350 ml  Net -1350 ml   Filed Weights   04/29/23 0500 04/30/23 0500 05/01/23 0500  Weight: 103.1 kg 103 kg 103.1 kg    Examination:  Physical Exam: GEN: NAD, alert and oriented x 3, obese HEENT: NCAT, PERRL, EOMI, sclera clear, MMM PULM: CTAB w/o wheezes/crackles, normal respiratory effort, on 2L nasal cannula CV: RRR w/o M/G/R GI: abd soft, NTND, NABS, no R/G/M GU: Foley catheter noted in place with clear yellow urine in collection canister MSK: no peripheral edema, moves all extremities independently NEURO: CN II-XII intact, no focal deficits, sensation to light touch intact PSYCH: normal mood/affect Integumentary: Ecchymosis noted to bilateral malar surfaces and nasal bridge, otherwise no other concerning rashes/lesions/wounds noted exposed skin surfaces.      Data Reviewed: I have personally reviewed following labs and imaging studies  CBC: Recent Labs  Lab 04/25/23 0414 04/25/23 1750 04/27/23 0338 04/28/23 0624 04/29/23 0408 04/30/23  0540 05/01/23 0514  WBC 25.0*   < > 14.9* 11.6* 10.4 8.3 7.9  NEUTROABS 20.3*  --   --   --   --   --   --   HGB 7.0*   < > 7.5* 8.3* 8.5* 8.4* 8.2*  HCT 21.8*   < > 23.3* 26.5* 27.6* 27.3*  26.4*  MCV 96.5   < > 97.5 98.9 101.1* 101.1* 100.0  PLT 81*   < > 101* 122* 152 161 171   < > = values in this interval not displayed.   Basic Metabolic Panel: Recent Labs  Lab 04/25/23 0414 04/26/23 0500 04/26/23 0803 04/27/23 0338 04/28/23 0624 04/29/23 0408 04/30/23 0540 05/01/23 0514  NA 139 134*   < > 136 136 137 138 137  K 3.7 3.7   < > 3.1* 3.5 3.5 3.8 3.3*  CL 112* 106   < > 101 101 101 101 93*  CO2 24 25   < > 29 31 30  32 34*  GLUCOSE 77 100*   < > 117* 96 114* 111* 76  BUN 26* 15   < > 12 7* 7* <5* <5*  CREATININE 0.91 0.88   < > 0.95 0.76 0.70 0.63 0.78  CALCIUM 7.6* 7.2*   < > 7.1* 7.4* 7.5* 7.8* 7.5*  MG 2.0  --   --  1.7  --  2.1 2.0 1.8  PHOS 2.3* 2.5  --   --   --   --   --   --    < > = values in this interval not displayed.   GFR: Estimated Creatinine Clearance: 74.4 mL/min (by C-G formula based on SCr of 0.78 mg/dL). Liver Function Tests: Recent Labs  Lab 04/26/23 0500  AST 64*  ALT 128*  ALKPHOS 24*  BILITOT 1.3*  PROT 3.6*  ALBUMIN 1.8*   No results for input(s): "LIPASE", "AMYLASE" in the last 168 hours.  No results for input(s): "AMMONIA" in the last 168 hours.  Coagulation Profile: No results for input(s): "INR", "PROTIME" in the last 168 hours.  Cardiac Enzymes: No results for input(s): "CKTOTAL", "CKMB", "CKMBINDEX", "TROPONINI" in the last 168 hours. BNP (last 3 results) No results for input(s): "PROBNP" in the last 8760 hours. HbA1C: No results for input(s): "HGBA1C" in the last 72 hours. CBG: Recent Labs  Lab 04/30/23 0738 04/30/23 1200 04/30/23 1541 04/30/23 2146 05/01/23 0801  GLUCAP 108* 96 108* 93 79   Lipid Profile: No results for input(s): "CHOL", "HDL", "LDLCALC", "TRIG", "CHOLHDL", "LDLDIRECT" in the last 72 hours. Thyroid Function Tests: No results for input(s): "TSH", "T4TOTAL", "FREET4", "T3FREE", "THYROIDAB" in the last 72 hours.  Anemia Panel: No results for input(s): "VITAMINB12", "FOLATE", "FERRITIN",  "TIBC", "IRON", "RETICCTPCT" in the last 72 hours.  Sepsis Labs: Recent Labs  Lab 04/26/23 0803  PROCALCITON 0.46    Recent Results (from the past 240 hour(s))  Culture, blood (routine x 2)     Status: None   Collection Time: 04/21/23  6:45 PM   Specimen: Left Antecubital; Blood  Result Value Ref Range Status   Specimen Description   Final    LEFT ANTECUBITAL BLOOD Performed at [facility], [address] [place], [place] [address]    Special Requests   Final    BOTTLES DRAWN AEROBIC AND ANAEROBIC Blood Culture results may not be optimal due to an excessive volume of blood received in culture bottles Performed at [facility], [address] [place], [place]  [address]    Culture   Final    NO GROWTH 5 DAYS Performed at [facility], [address], [place], [place] [address]    Report Status 04/26/2023 FINAL  Final  Resp panel by RT-PCR (RSV, Flu A&B, Covid) Anterior Nasal Swab     Status: None   Collection Time: 04/21/23  8:12 PM   Specimen: Anterior Nasal Swab  Result Value Ref Range Status   SARS Coronavirus 2 by RT PCR NEGATIVE NEGATIVE Final    Comment: (NOTE) SARS-CoV-2 target nucleic acids are NOT DETECTED.  The SARS-CoV-2 RNA is generally detectable in upper respiratory specimens during the acute phase of infection. The lowest concentration of SARS-CoV-2 viral copies this assay can detect is 138 copies/mL. A negative result does not preclude SARS-Cov-2 infection and should not be used as the sole basis for treatment or other patient management decisions. A negative result may occur with  improper specimen collection/handling, submission of specimen other than nasopharyngeal swab, presence of viral mutation(s) within the areas targeted by this assay, and inadequate number of viral copies(<138 copies/mL). A negative result must be combined with clinical observations, patient history, and epidemiological information. The expected  result is Negative.  Fact Sheet for Patients:  BloggerCourse.com  Fact Sheet for Healthcare Providers:  SeriousBroker.it  This test is no t yet approved or cleared by the [place] FDA and  has been authorized for detection and/or diagnosis of SARS-CoV-2 by FDA under an Emergency Use Authorization (EUA). This EUA will remain  in effect (meaning this test can be used) for the duration of the COVID-19 declaration under Section 564(b)(1) of the Act, 21 U.S.C.section 360bbb-3(b)(1), unless the authorization is terminated  or revoked sooner.       Influenza A by PCR NEGATIVE NEGATIVE Final   Influenza B by PCR NEGATIVE NEGATIVE Final    Comment: (NOTE) The Xpert Xpress SARS-CoV-2/FLU/RSV plus assay is intended as an aid in the diagnosis of influenza from Nasopharyngeal swab specimens and should not be used as a sole basis for treatment. Nasal washings and aspirates are unacceptable for Xpert Xpress SARS-CoV-2/FLU/RSV testing.  Fact Sheet for Patients: BloggerCourse.com  Fact Sheet for Healthcare Providers: SeriousBroker.it  This test is not yet approved or cleared by the [place] FDA and has been authorized for detection and/or diagnosis of SARS-CoV-2 by FDA under an Emergency Use Authorization (EUA). This EUA will remain in effect (meaning this test can be used) for the duration of the COVID-19 declaration under Section 564(b)(1) of the Act, 21 U.S.C. section 360bbb-3(b)(1), unless the authorization is terminated or revoked.     Resp Syncytial Virus by PCR NEGATIVE NEGATIVE Final    Comment: (NOTE) Fact Sheet for Patients: BloggerCourse.com  Fact Sheet for Healthcare Providers: SeriousBroker.it  This test is not yet approved or cleared by the [place] FDA and has been authorized for detection and/or diagnosis of  SARS-CoV-2 by FDA under an Emergency Use Authorization (EUA). This EUA will remain in effect (meaning this test can be used) for the duration of the COVID-19 declaration under Section 564(b)(1) of the Act, 21 U.S.C. section 360bbb-3(b)(1), unless the authorization is terminated or revoked.  Performed at [facility], [address] [place], [place] [address]   Culture, blood (routine x 2)     Status: None   Collection Time: 04/21/23  8:19 PM   Specimen: BLOOD  Result Value Ref Range Status   Specimen Description   Final    BLOOD RIGHT  ANTECUBITAL Performed at [facility], [address], [place], [place] [address]    Special Requests   Final    BOTTLES DRAWN AEROBIC AND ANAEROBIC Blood Culture adequate volume Performed at [facility], [address], [place], [place] [address]    Culture   Final    NO GROWTH 5 DAYS Performed at [facility], [address], [place], [place] [address]    Report Status 04/26/2023 FINAL  Final  Urine Culture (for pregnant, neutropenic or urologic patients or patients with an indwelling urinary catheter)     Status: None   Collection Time: 04/21/23  8:36 PM   Specimen: Urine, Clean Catch  Result Value Ref Range Status   Specimen Description   Final    URINE, CLEAN CATCH Performed at [facility], [address] [place], [place] [address]    Special Requests   Final    NONE Performed at [facility], [address], [place], [place] [address]    Culture   Final    NO GROWTH Performed at [facility], [address], [place], [place] [address]    Report Status 04/22/2023 FINAL  Final  C Difficile Quick Screen w PCR reflex     Status: None   Collection Time: 04/22/23  4:48 PM   Specimen: STOOL  Result Value Ref Range Status   C Diff antigen NEGATIVE NEGATIVE Final   C Diff toxin NEGATIVE NEGATIVE Final   C Diff interpretation No C.  difficile detected.  Final    Comment: Performed at [facility], [address], [place], [place] [address]  Gastrointestinal Panel by PCR , Stool     Status: None   Collection Time: 04/22/23  7:29 PM   Specimen: Stool  Result Value Ref Range Status   Campylobacter species NOT DETECTED NOT DETECTED Final   Plesimonas shigelloides NOT DETECTED NOT DETECTED Final   Salmonella species NOT DETECTED NOT DETECTED Final   Yersinia enterocolitica NOT DETECTED NOT DETECTED Final   Vibrio species NOT DETECTED NOT DETECTED Final   Vibrio cholerae NOT DETECTED NOT DETECTED Final   Enteroaggregative E coli (EAEC) NOT DETECTED NOT DETECTED Final   Enteropathogenic E coli (EPEC) NOT DETECTED NOT DETECTED Final   Enterotoxigenic E coli (ETEC) NOT DETECTED NOT DETECTED Final   Shiga like toxin producing E coli (STEC) NOT DETECTED NOT DETECTED Final   Shigella/Enteroinvasive E coli (EIEC) NOT DETECTED NOT DETECTED Final   Cryptosporidium NOT DETECTED NOT DETECTED Final   Cyclospora cayetanensis NOT DETECTED NOT DETECTED Final   Entamoeba histolytica NOT DETECTED NOT DETECTED Final   Giardia lamblia NOT DETECTED NOT DETECTED Final   Adenovirus F40/41 NOT DETECTED NOT DETECTED Final   Astrovirus NOT DETECTED NOT DETECTED Final   Norovirus GI/GII NOT DETECTED NOT DETECTED Final   Rotavirus A NOT DETECTED NOT DETECTED Final   Sapovirus (I, II, IV, and V) NOT DETECTED NOT DETECTED Final    Comment: Performed at [facility], [address] [place], [place] [address]         Radiology Studies: No results found.      Scheduled Meds:  sodium chloride   Intravenous Once   Chlorhexidine Gluconate Cloth  6 each Topical Daily   feeding supplement  237 mL Oral BID BM   levothyroxine  75 mcg Oral Q0600   loratadine  10 mg Oral Daily   montelukast  10 mg Oral QHS  mouth rinse  15 mL Mouth Rinse 4 times per day   pantoprazole  40 mg Oral BID   sodium chloride flush   10-40 mL Intracatheter Q12H   Continuous Infusions:  sodium chloride Stopped (04/23/23 2320)     LOS: 10 days    Time spent: 52 minutes spent on chart review, discussion with nursing staff, consultants, updating family and interview/physical exam; more than 50% of that time was spent in counseling and/or coordination of care.    Alvira Philips [place], DO [facility] Available via Epic secure chat 7am-7pm After these hours, please refer to coverage provider listed on [email or web address] 05/01/2023, 12:21 PM

## 2023-05-02 ENCOUNTER — Encounter (HOSPITAL_COMMUNITY): Payer: Self-pay | Admitting: Gastroenterology

## 2023-05-02 DIAGNOSIS — D62 Acute posthemorrhagic anemia: Secondary | ICD-10-CM | POA: Diagnosis not present

## 2023-05-02 LAB — GLUCOSE, CAPILLARY
Glucose-Capillary: 100 mg/dL — ABNORMAL HIGH (ref 70–99)
Glucose-Capillary: 86 mg/dL (ref 70–99)
Glucose-Capillary: 88 mg/dL (ref 70–99)
Glucose-Capillary: 90 mg/dL (ref 70–99)
Glucose-Capillary: 95 mg/dL (ref 70–99)
Glucose-Capillary: 98 mg/dL (ref 70–99)

## 2023-05-02 MED ORDER — ALPRAZOLAM 0.25 MG PO TABS: 0.2500 mg | ORAL_TABLET | Freq: Every evening | ORAL | Status: DC | PRN

## 2023-05-02 MED ORDER — AMLODIPINE BESYLATE 5 MG PO TABS
5.0000 mg | ORAL_TABLET | Freq: Every day | ORAL | Status: DC
  Administered 2023-05-02 – 2023-05-05 (×4): 5 mg via ORAL
  Filled 2023-05-02 (×4): qty 1

## 2023-05-02 NOTE — Progress Notes (Signed)
PROGRESS NOTE    Nicole Peters  ZOX:[id number] DOB: 05/18/1951 DOA: 04/21/2023 PCP: Lezlie Lye, Meda Coffee, MD    Brief Narrative:   Nicole Peters is a 72 y.o. female with past medical history significant for essential pretension, hyperlipidemia, anxiety/depression, osteoporosis, morbid obesity who presented to [facility] ED on 5/29 via EMS with confusion, poor appetite.  Patient with reported fall at home 3 days prior.  On EMS arrival, patient with some confusion and while trying to stand up she experienced a syncopal episode.  Workup in the ED notable for sodium of 128, CO2 15, BUN 115, creatinine 3.05, lactic acid 8.8, WBC 55.6, hemoglobin 4.4.  EDP consulted PCCM and patient was admitted to the intensive care unit under the pulmonary critical care service.  Significant Hospital events: 5/29: Admitted to the PCCM service, hemoglobin 4.4 5/30: Started on low-dose vasopressors, confusion increased, transfusing additional PRBC.  CTA GI given transition to brisk melena with findings of active extravasation from mid gastroduodenal artery, noted transient atrial fibrillation 5/31: IR consulted underwent GDA embolization, intubated postprocedure 6/1: Extubated, vasopressors weaned off 6/4: Transferred to the hospitalist service 6/5: EGD with large duodenal ulcer, nonbleeding 6/6: Transfer to medical floor 6/7: PT recommends home health, OT recommends SNF, Lasix IV x 2 today 6/8: PT now recommending SNF, TOC consulted for placement 6/9: Medically stable for discharge to SNF once bed available  Assessment & Plan:   Upper GI bleed likely secondary to NSAID induced duodenal ulcer Acute blood loss anemia Hypovolemic shock Patient presenting to ED with confusion, syncopal episode and was found to have a hemoglobin of 4.4.  Etiology likely secondary to NSAID induced duodenal ulcer.  During the hospitalization; bleeding had become more brisk and underwent CT angiogram GI study with noted  active extravasation from mid gastroduodenal artery.  Patient did require vasopressor support due to significant blood loss which was eventually titrated off.  Patient was transfused 2 unit PRBCs on 5/29, 3 units PRBCs on 5/30, 1 unit pRBC and 2 units cryoprecipitate 5/31, 1 unit PRBC 6/2.  Interventional radiology was consulted and patient underwent GDA embolization on 5/31.  GI was consulted and patient underwent EGD on 6/5 with findings of large 2 cm deep with nonbleeding cratered duodenal ulcer with coil first portion of the duodenum with surrounding erythema/edema. -- [facility] following; appreciate assistance -- Hgb 4.4>>7.8>6.2>>8.7>>6.7>7.5>7.5>8.3>8.4>8.2 -- Protonix 40 mg PO q12h (plan BID dosing on discharge for 12 weeks followed by once daily thereafter) -- Avoidance of NSAIDs x 12 weeks, no aspirin for 4 weeks -- CBC in a.m.  Leukocytosis No infectious etiology elucidated.  C. difficile and GI PCR panel negative.  Blood cultures x 2 from 5/29 with no growth.  Completed 6-day course of IV antibiotics. -- WBC 55.6>48.9>>14.9>11.6>10.4>8.3>7.9  Acute metabolic encephalopathy: Resolved Etiology likely multifactorial in the setting of acute blood loss anemia secondary to upper GI bleed, uremia secondary to renal failure with acidosis.  -- Restart home Xanax 0.25 mg p.o. nightly as needed anxiety  Acute renal failure Etiology likely secondary to prerenal azotemia in the setting of poor oral intake in days preceding hospitalization versus ATN from significant blood loss anemia as above requiring vasopressor support initially while in the intensive care unit.  Renal ultrasound with no acute findings. -- Cr 3.05>>>0.76>0.70>0.63>0.78 -- Continue to hold home benazepril -- Repeat BMP in a.m.  Acute hypoxic respiratory failure Suspect etiology likely related to blood loss and significant volume overload with IV fluids/blood resuscitation during hospitalization.  Received IV Lasix with  improvement.  Chest x-ray 6/6 with no active cardiopulmonary disease process. -- Incentive spirometry -- Continue to wean supplemental oxygen to maintain SpO2 > 92%  Hypoglycemia: Resolved D10 drip discontinued on 04/30/2023.  Hypokalemia Repleted  Hypothyroidism TSH 15.847, free T4 0.72. -- Levothyroxine 25 mcg p.o. daily  Paroxysmal atrial fibrillation Essential hypertension Home medications include benazepril 20 mg p.o. daily, amlodipine 5 mg p.o. daily.  Patient did require vasopressor support during initial hospitalization due to hypovolemic shock as above. -- BP 133/72 this morning -- Restart home amlodipine 5 mg p.o. daily today -- Continue to hold home benazepril for now -- Anticoagulation contraindicated given GI bleed as above -- Monitor on telemetry  Hyperlipidemia -- Simvastatin 80 mg p.o. daily  Anxiety/depression On Xanax as needed at baseline.  Currently on hold due to encephalopathy as above.  Weakness/debility/deconditioning: -- PT/OT recommending SNF -- [facility] consulted for SNF placement    DVT prophylaxis: SCDs Start: 04/21/23 2120    Code Status: Full Code Family Communication: No family present at bedside this morning  Disposition Plan:  Level of care: Med-Surg Status is: Inpatient Remains inpatient appropriate because: Pending SNF placement, medically stable for discharge once bed available    Consultants:  PCCM: Signed off 6/4 [place] [facility] Interventional radiology  Procedures:  Renal u/s 5/30 >> no acute findings CT angio GI bleed 5/30 >> active extravasation from mid-gastroduodenal artery, coronary calcification, small HH, active hemorrhage from 2nd portion of duodenum, severe sigmoid diverticulosis  Antimicrobials:  Vancomycin 5/29 - 5/29 Cefepime 5/29 - 6/1 Zosyn 6/2 - 6/3 Metronidazole 5/29 - 6/1   Subjective: Patient seen examined bedside, resting comfortably in bed.  No specific complaints,, concerns or questions this  morning.  Denies headache, no visual changes, no chest pain, no palpitations, no shortness of breath, no abdominal pain, no fever, no chills, no nausea/vomiting/diarrhea.  No acute events overnight per nursing staff.  Pending SNF placement, medically stable for discharge once bed available.  Objective: Vitals:   05/01/23 1428 05/01/23 2025 05/02/23 0457 05/02/23 1313  BP: (!) 128/53 134/69 133/72 (!) 141/65  Pulse: 80 84 81 86  Resp:  18 18   Temp: 97.8 F (36.6 C) 98.3 F (36.8 C) 98.4 F (36.9 C) 98.2 F (36.8 C)  TempSrc: Oral Oral Oral Oral  SpO2: 98% 96% 98% (!) 87%  Weight:      Height:        Intake/Output Summary (Last 24 hours) at 05/02/2023 1350 Last data filed at 05/02/2023 0750 Gross per 24 hour  Intake 130 ml  Output 750 ml  Net -620 ml   Filed Weights   04/29/23 0500 04/30/23 0500 05/01/23 0500  Weight: 103.1 kg 103 kg 103.1 kg    Examination:  Physical Exam: GEN: NAD, alert and oriented x 3, obese HEENT: NCAT, PERRL, EOMI, sclera clear, MMM PULM: CTAB w/o wheezes/crackles, normal respiratory effort, on 2L nasal cannula with SpO2 98% at rest CV: RRR w/o M/G/R GI: abd soft, NTND, NABS, no R/G/M GU: Foley catheter noted in place with clear yellow urine in collection canister MSK: no peripheral edema, moves all extremities independently NEURO: CN II-XII intact, no focal deficits, sensation to light touch intact PSYCH: normal mood/affect Integumentary: Ecchymosis noted to bilateral malar surfaces and nasal bridge, otherwise no other concerning rashes/lesions/wounds noted exposed skin surfaces.      Data Reviewed: I have personally reviewed following labs and imaging studies  CBC: Recent Labs  Lab 04/27/23 0338 04/28/23 0624 04/29/23  0408 04/30/23 0540 05/01/23 0514  WBC 14.9* 11.6* 10.4 8.3 7.9  HGB 7.5* 8.3* 8.5* 8.4* 8.2*  HCT 23.3* 26.5* 27.6* 27.3* 26.4*  MCV 97.5 98.9 101.1* 101.1* 100.0  PLT 101* 122* 152 161 171   Basic Metabolic  Panel: Recent Labs  Lab 04/26/23 0500 04/26/23 0803 04/27/23 0338 04/28/23 0624 04/29/23 0408 04/30/23 0540 05/01/23 0514  NA 134*   < > 136 136 137 138 137  K 3.7   < > 3.1* 3.5 3.5 3.8 3.3*  CL 106   < > 101 101 101 101 93*  CO2 25   < > 29 31 30  32 34*  GLUCOSE 100*   < > 117* 96 114* 111* 76  BUN 15   < > 12 7* 7* <5* <5*  CREATININE 0.88   < > 0.95 0.76 0.70 0.63 0.78  CALCIUM 7.2*   < > 7.1* 7.4* 7.5* 7.8* 7.5*  MG  --   --  1.7  --  2.1 2.0 1.8  PHOS 2.5  --   --   --   --   --   --    < > = values in this interval not displayed.   GFR: Estimated Creatinine Clearance: 74.4 mL/min (by C-G formula based on SCr of 0.78 mg/dL). Liver Function Tests: Recent Labs  Lab 04/26/23 0500  AST 64*  ALT 128*  ALKPHOS 24*  BILITOT 1.3*  PROT 3.6*  ALBUMIN 1.8*   No results for input(s): "LIPASE", "AMYLASE" in the last 168 hours.  No results for input(s): "AMMONIA" in the last 168 hours.  Coagulation Profile: No results for input(s): "INR", "PROTIME" in the last 168 hours.  Cardiac Enzymes: No results for input(s): "CKTOTAL", "CKMB", "CKMBINDEX", "TROPONINI" in the last 168 hours. BNP (last 3 results) No results for input(s): "PROBNP" in the last 8760 hours. HbA1C: No results for input(s): "HGBA1C" in the last 72 hours. CBG: Recent Labs  Lab 05/01/23 2026 05/02/23 0028 05/02/23 0428 05/02/23 0712 05/02/23 1155  GLUCAP 126* 100* 86 90 98   Lipid Profile: No results for input(s): "CHOL", "HDL", "LDLCALC", "TRIG", "CHOLHDL", "LDLDIRECT" in the last 72 hours. Thyroid Function Tests: No results for input(s): "TSH", "T4TOTAL", "FREET4", "T3FREE", "THYROIDAB" in the last 72 hours.  Anemia Panel: No results for input(s): "VITAMINB12", "FOLATE", "FERRITIN", "TIBC", "IRON", "RETICCTPCT" in the last 72 hours.  Sepsis Labs: Recent Labs  Lab 04/26/23 0803  PROCALCITON 0.46    Recent Results (from the past 240 hour(s))  C Difficile Quick Screen w PCR reflex      Status: None   Collection Time: 04/22/23  4:48 PM   Specimen: STOOL  Result Value Ref Range Status   C Diff antigen NEGATIVE NEGATIVE Final   C Diff toxin NEGATIVE NEGATIVE Final   C Diff interpretation No C. difficile detected.  Final    Comment: Performed at [facility], [address], [place], [place] [address]  Gastrointestinal Panel by PCR , Stool     Status: None   Collection Time: 04/22/23  7:29 PM   Specimen: Stool  Result Value Ref Range Status   Campylobacter species NOT DETECTED NOT DETECTED Final   Plesimonas shigelloides NOT DETECTED NOT DETECTED Final   Salmonella species NOT DETECTED NOT DETECTED Final   Yersinia enterocolitica NOT DETECTED NOT DETECTED Final   Vibrio species NOT DETECTED NOT DETECTED Final   Vibrio cholerae NOT DETECTED NOT DETECTED Final   Enteroaggregative E coli (EAEC) NOT DETECTED NOT DETECTED  Final   Enteropathogenic E coli (EPEC) NOT DETECTED NOT DETECTED Final   Enterotoxigenic E coli (ETEC) NOT DETECTED NOT DETECTED Final   Shiga like toxin producing E coli (STEC) NOT DETECTED NOT DETECTED Final   Shigella/Enteroinvasive E coli (EIEC) NOT DETECTED NOT DETECTED Final   Cryptosporidium NOT DETECTED NOT DETECTED Final   Cyclospora cayetanensis NOT DETECTED NOT DETECTED Final   Entamoeba histolytica NOT DETECTED NOT DETECTED Final   Giardia lamblia NOT DETECTED NOT DETECTED Final   Adenovirus F40/41 NOT DETECTED NOT DETECTED Final   Astrovirus NOT DETECTED NOT DETECTED Final   Norovirus GI/GII NOT DETECTED NOT DETECTED Final   Rotavirus A NOT DETECTED NOT DETECTED Final   Sapovirus (I, II, IV, and V) NOT DETECTED NOT DETECTED Final    Comment: Performed at [facility], [address], [place], [place] [address]         Radiology Studies: No results found.      Scheduled Meds:  sodium chloride   Intravenous Once   Chlorhexidine Gluconate Cloth  6 each Topical Daily   feeding supplement  237 mL Oral  BID BM   levothyroxine  75 mcg Oral Q0600   loratadine  10 mg Oral Daily   montelukast  10 mg Oral QHS   mouth rinse  15 mL Mouth Rinse 4 times per day   pantoprazole  40 mg Oral BID   sodium chloride flush  10-40 mL Intracatheter Q12H   Continuous Infusions:  sodium chloride Stopped (04/23/23 2320)     LOS: 11 days    Time spent: 52 minutes spent on chart review, discussion with nursing staff, consultants, updating family and interview/physical exam; more than 50% of that time was spent in counseling and/or coordination of care.    Alvira Philips [place], DO [facility] Available via Epic secure chat 7am-7pm After these hours, please refer to coverage provider listed on [email or web address] 05/02/2023, 1:50 PM

## 2023-05-02 NOTE — NC FL2 (Signed)
[place] MEDICAID FL2 LEVEL OF CARE FORM     IDENTIFICATION  Patient Name: Nicole Peters Birthdate: [birth year] Sex: female Admission Date (Current Location): 04/21/2023  [facility] and [place] Number:  Producer, television/film/video and Address:  [facility],  [address] [place], [place] [address]      Provider Number: [id number]  Attending Physician Name and Address:  [place], Eric J, DO  Relative Name and Phone Number:  Trine, Fread (Spouse) [phone] [facility])    Current Level of Care: Hospital Recommended Level of Care: Skilled Nursing Facility Prior Approval Number:    Date Approved/Denied:   PASRR Number: [id number]  Discharge Plan: SNF    Current Diagnoses: Patient Active Problem List   Diagnosis Date Noted   Shock (HCC) 04/23/2023   Hemorrhagic shock (HCC) 04/23/2023   Gastrointestinal hemorrhage 04/23/2023   Endotracheally intubated 04/23/2023   Thrombocytopenia (HCC) 04/23/2023   AKI (acute kidney injury) (HCC) 04/23/2023   Encephalopathy acute 04/22/2023   Anemia 04/22/2023   Leukocytosis 04/22/2023   Syncope 04/22/2023   Hypotension due to hypovolemia 04/22/2023   ABLA (acute blood loss anemia) 04/21/2023   Primary osteoarthritis of left knee 06/24/2018   Age-related osteoporosis without current pathological fracture 10/22/2016   Pure hypercholesterolemia 08/12/2015   Primary osteoarthritis of both knees 08/12/2015   Hematuria 08/13/2014   DJD (degenerative joint disease) 10/18/2012   Depression with anxiety 10/18/2012   Obesity (BMI 30.0-34.9) 10/14/2012   HTN (hypertension) 06/10/2012    Orientation RESPIRATION BLADDER Height & Weight     Self, Time, Situation, Place  O2 (2L) Incontinent Weight: 227 lb 4.7 oz (103.1 kg) Height:  5\' 4"  (162.6 cm)  BEHAVIORAL SYMPTOMS/MOOD NEUROLOGICAL BOWEL NUTRITION STATUS      Incontinent Diet (Soft)  AMBULATORY STATUS COMMUNICATION OF NEEDS Skin   Limited Assist Verbally Skin abrasions (Buttocks)                        Personal Care Assistance Level of Assistance  Bathing, Feeding, Dressing Bathing Assistance: Limited assistance Feeding assistance: Independent Dressing Assistance: Limited assistance     Functional Limitations Info  Hearing, Speech, Sight Sight Info: Adequate Hearing Info: Adequate Speech Info: Adequate    SPECIAL CARE FACTORS FREQUENCY  PT (By licensed PT), OT (By licensed OT)     PT Frequency: 5 x a week OT Frequency: 5 x a week            Contractures Contractures Info: Not present    Additional Factors Info  Code Status, Allergies Code Status Info: full Allergies Info: NKA           Current Medications (05/02/2023):  This is the current hospital active medication list Current Facility-Administered Medications  Medication Dose Route Frequency Provider Last Rate Last Admin   0.9 %  sodium chloride infusion (Manually program via Guardrails IV Fluids)   Intravenous Once Coralyn Helling, MD       0.9 %  sodium chloride infusion  250 mL Intravenous Continuous [name], MD   Stopped at 04/23/23 2320   albuterol (PROVENTIL) (2.5 MG/3ML) 0.083% nebulizer solution 2.5 mg  2.5 mg Nebulization Q4H PRN [name], MD   2.5 mg at 04/27/23 0032   ALPRAZolam (XANAX) tablet 0.25 mg  0.25 mg Oral QHS PRN [place], Eric J, DO       amLODipine (NORVASC) tablet 5 mg  5 mg Oral Daily [place], Alvira Philips, DO   5 mg at 05/02/23 1427  Chlorhexidine Gluconate Cloth 2 % PADS 6 each  6 each Topical Daily Lorin Glass, MD   6 each at 05/02/23 0939   diphenhydrAMINE (BENADRYL) injection 25 mg  25 mg Intravenous Q6H PRN Lanier Clam, NP   25 mg at 04/27/23 2134   feeding supplement (ENSURE ENLIVE / ENSURE PLUS) liquid 237 mL  237 mL Oral BID BM [place], Eric J, DO   237 mL at 05/02/23 1352   labetalol (NORMODYNE) injection 10 mg  10 mg Intravenous Q4H PRN Coralyn Helling, MD       levothyroxine (SYNTHROID) tablet 75 mcg  75 mcg Oral Q0600 Omar Person, MD   75  mcg at 05/02/23 0606   loratadine (CLARITIN) tablet 10 mg  10 mg Oral Daily Omar Person, MD   10 mg at 05/02/23 0939   montelukast (SINGULAIR) tablet 10 mg  10 mg Oral QHS Omar Person, MD   10 mg at 05/01/23 2131   Oral care mouth rinse  15 mL Mouth Rinse 4 times per day Coralyn Helling, MD   15 mL at 05/02/23 0731   Oral care mouth rinse  15 mL Mouth Rinse PRN Coralyn Helling, MD       pantoprazole (PROTONIX) EC tablet 40 mg  40 mg Oral BID [place], Eric J, DO   40 mg at 05/02/23 0939   polyethylene glycol (MIRALAX / GLYCOLAX) packet 17 g  17 g Oral Daily PRN Luciano Cutter, MD       sodium chloride flush (NS) 0.9 % injection 10-40 mL  10-40 mL Intracatheter Q12H Lorin Glass, MD   10 mL at 05/02/23 0939   sodium chloride flush (NS) 0.9 % injection 10-40 mL  10-40 mL Intracatheter PRN Lorin Glass, MD         Discharge Medications: Please see discharge summary for a list of discharge medications.  Relevant Imaging Results:  Relevant Lab Results:   Additional Information SSN [id number]  Valentina Shaggy Jamario Colina, LCSW

## 2023-05-03 DIAGNOSIS — D62 Acute posthemorrhagic anemia: Secondary | ICD-10-CM | POA: Diagnosis not present

## 2023-05-03 LAB — GLUCOSE, CAPILLARY
Glucose-Capillary: 101 mg/dL — ABNORMAL HIGH (ref 70–99)
Glucose-Capillary: 102 mg/dL — ABNORMAL HIGH (ref 70–99)
Glucose-Capillary: 76 mg/dL (ref 70–99)
Glucose-Capillary: 83 mg/dL (ref 70–99)
Glucose-Capillary: 96 mg/dL (ref 70–99)
Glucose-Capillary: 99 mg/dL (ref 70–99)

## 2023-05-03 LAB — BASIC METABOLIC PANEL
Anion gap: 8 (ref 5–15)
BUN: 8 mg/dL (ref 8–23)
CO2: 30 mmol/L (ref 22–32)
Calcium: 8.1 mg/dL — ABNORMAL LOW (ref 8.9–10.3)
Chloride: 95 mmol/L — ABNORMAL LOW (ref 98–111)
Creatinine, Ser: 0.82 mg/dL (ref 0.44–1.00)
GFR, Estimated: >60 mL/min (ref >60)
Glucose, Bld: 83 mg/dL (ref 70–99)
Potassium: 4.1 mmol/L (ref 3.5–5.1)
Sodium: 133 mmol/L — ABNORMAL LOW (ref 135–145)

## 2023-05-03 LAB — MAGNESIUM: Magnesium: 2 mg/dL (ref 1.7–2.4)

## 2023-05-03 LAB — CBC
HCT: 28.6 % — ABNORMAL LOW (ref 36.0–46.0)
Hemoglobin: 8.8 g/dL — ABNORMAL LOW (ref 12.0–15.0)
MCH: 30.6 pg (ref 26.0–34.0)
MCHC: 30.8 g/dL (ref 30.0–36.0)
MCV: 99.3 fL (ref 80.0–100.0)
Platelets: 196 10*3/uL (ref 150–400)
RBC: 2.88 MIL/uL — ABNORMAL LOW (ref 3.87–5.11)
RDW: 15.8 % — ABNORMAL HIGH (ref 11.5–15.5)
WBC: 8.5 10*3/uL (ref 4.0–10.5)
nRBC: 0 % (ref 0.0–0.2)

## 2023-05-03 MED ORDER — ENSURE ENLIVE PO LIQD: 237.0000 mL | Freq: Two times a day (BID) | ORAL | 12 refills | Status: DC

## 2023-05-03 MED ORDER — ALPRAZOLAM 0.25 MG PO TABS: 0.2500 mg | ORAL_TABLET | Freq: Every evening | ORAL | 0 refills | Status: AC | PRN

## 2023-05-03 MED ORDER — PANTOPRAZOLE SODIUM 40 MG PO TBEC: 40.0000 mg | DELAYED_RELEASE_TABLET | Freq: Two times a day (BID) | ORAL | 0 refills | Status: DC

## 2023-05-03 NOTE — TOC Progression Note (Signed)
Transition of Care [facility]) - Progression Note    Patient Details  Name: Nicole Peters MRN: [id number] Date of Birth: 1951/04/20  [facility] CM/SW Contact  Beckie Busing, RN Phone [phone]  05/03/2023, 4:04 PM  Clinical Narrative:    CM at bedside to present bed offers to patient and husband. Patient requesting [facility] or [facility] which currently have no offers. CM has reached out to both facilities to see if they are able to offer patient a bed. Awaiting response. CM to follow up in am   Expected Discharge Plan:  (unknown at this time) Barriers to Discharge: Continued Medical Work up  Expected Discharge Plan and Services In-house Referral: NA Discharge Planning Services: CM Consult Post Acute Care Choice: NA Living arrangements for the past 2 months: Single Family Home Expected Discharge Date: 05/03/23               DME Arranged: N/A DME Agency: NA       HH Arranged: NA HH Agency: NA         Social Determinants of Health (SDOH) Interventions SDOH Screenings   Housing: Patient Unable To Answer (04/22/2023)  Depression (PHQ2-9): Low Risk  (09/16/2019)  Tobacco Use: Medium Risk (05/02/2023)    Readmission Risk Interventions    04/27/2023   11:57 AM 04/22/2023    5:42 PM  Readmission Risk Prevention Plan  Post Dischage Appt Complete   Transportation Screening  Complete  PCP or Specialist Appt within 5-7 Days  Complete  Home Care Screening  Complete  Medication Review (RN CM)  Complete

## 2023-05-03 NOTE — Progress Notes (Signed)
Physical Therapy Treatment Patient Details Name: Nicole Peters MRN: [id number] DOB: 05-28-1951 Today's Date: 05/03/2023   History of Present Illness 72 y.o. female who presented to [facility] ED on 5/29 via EMS with confusion, weakness, fall, Hgb 4.4.  Pt admitted with upper GI bleed likely secondary to NSAID induced duodenal ulcer, acute blood loss anemia, and hypovolemic shock. On 5/31 IR consulted and underwent GDA embolization and intubated 04/23/23-04/24/23 post procedure.  PMH: essential pretension, hyperlipidemia, anxiety/depression, osteoporosis, morbid obesity    PT Comments     Pt admitted with above diagnosis.  Pt currently with functional limitations due to the deficits listed below (see PT Problem List). Pt in bed when PT arrived. Pt indicated she just did not feel right today. Pt agreeable to therapy intervention. PT noted that pt has removed [place] tube and on 90% on RA at rest and 90% seated EOB, pt required increased time, HOB elevated and use of bed rail for supine to sit. Pt required cues and min guard for STS from EOB. Pt required B UE support and min guard for standing balance at RW. Pt able to demonstrate good progress with gait tolerance 35 feet with RW, recliner close cues for posture and proper distance from RW with pt on RA and desaturated to 80% on RA, 1:17 unable to recover on RA > 82% and 2 L/min pt able to recover to 92% in an additional 40s. Pt left in recliner on 2 L/min and husband present with all needs in place. Pt will benefit from acute skilled PT to increase their independence and safety with mobility to allow discharge.     Recommendations for follow up therapy are one component of a multi-disciplinary discharge planning process, led by the attending physician.  Recommendations may be updated based on patient status, additional functional criteria and insurance authorization.  Follow Up Recommendations  Can patient physically be transported by private  vehicle: Yes    Assistance Recommended at Discharge Intermittent Supervision/Assistance  Patient can return home with the following A little help with walking and/or transfers;A little help with bathing/dressing/bathroom;Assistance with cooking/housework;Help with stairs or ramp for entrance   Equipment Recommendations  None recommended by PT    Recommendations for Other Services       Precautions / Restrictions Precautions Precautions: Fall Restrictions Weight Bearing Restrictions: No     Mobility  Bed Mobility Overal bed mobility: Needs Assistance Bed Mobility: Supine to Sit     Supine to sit: HOB elevated, Min guard     General bed mobility comments: increased time cues for use of bed rail for increased IND with supine to sit    Transfers Overall transfer level: Needs assistance Equipment used: Rolling walker (2 wheels) Transfers: Sit to/from Stand Sit to Stand: Min guard           General transfer comment: pt able to follow directions to push up with R UE and completed power up with cues for extension posture when in standing, B UE support at [facility] for balance no reports of dizziness or SOB    Ambulation/Gait Ambulation/Gait assistance: Min guard Gait Distance (Feet): 35 Feet Assistive device: Rolling walker (2 wheels) Gait Pattern/deviations: Step-to pattern, Wide base of support Gait velocity: decreased     General Gait Details: fatigued easily with recliner close   Stairs             Wheelchair Mobility    Modified Rankin (Stroke Patients Only)       Balance  Overall balance assessment: Needs assistance, History of Falls Sitting-balance support: Feet supported, No upper extremity supported Sitting balance-Leahy Scale: Good     Standing balance support: During functional activity, Reliant on assistive device for balance Standing balance-Leahy Scale: Poor Standing balance comment: min guard for static standing balace at [employer]                             Cognition Arousal/Alertness: Awake/alert Behavior During Therapy: [facility] for tasks assessed/performed Overall Cognitive Status: Within Functional Limits for tasks assessed                                 General Comments: Overall functional but sometimes increased time to respond        Exercises      General Comments        Pertinent Vitals/Pain Pain Assessment Pain Assessment: No/denies pain    Home Living                          Prior Function            PT Goals (current goals can now be found in the care plan section) Acute Rehab PT Goals Patient Stated Goal: get better PT Goal Formulation: With patient Time For Goal Achievement: 05/13/23 Potential to Achieve Goals: Good Progress towards PT goals: Progressing toward goals    Frequency    Min 1X/week      PT Plan Discharge plan needs to be updated    Co-evaluation              AM-PAC PT "6 Clicks" Mobility   Outcome Measure  Help needed turning from your back to your side while in a flat bed without using bedrails?: A Little Help needed moving from lying on your back to sitting on the side of a flat bed without using bedrails?: A Little Help needed moving to and from a bed to a chair (including a wheelchair)?: A Little Help needed standing up from a chair using your arms (e.g., wheelchair or bedside chair)?: A Little Help needed to walk in hospital room?: A Little Help needed climbing 3-5 steps with a railing? : Total 6 Click Score: 16    End of Session Equipment Utilized During Treatment: Gait belt Activity Tolerance: Patient limited by fatigue (SOB) Patient left: in chair;with call bell/phone within reach;with family/visitor present Nurse Communication: Mobility status;Other (comment) (O2 saturation on RA) PT Visit Diagnosis: Other abnormalities of gait and mobility (R26.89);Difficulty in walking, not elsewhere classified (R26.2)     Time:  1610-9604 PT Time Calculation (min) (ACUTE ONLY): 24 min  Charges:  $Gait Training: 8-22 mins $Therapeutic Activity: 8-22 mins                     Johnny Bridge, PT Acute Rehab   Jacqualyn Posey 05/03/2023, 11:31 AM

## 2023-05-03 NOTE — Progress Notes (Signed)
PROGRESS NOTE    Nicole Peters  WUJ:[id number] DOB: [birth year] DOA: 04/21/2023 PCP: Lezlie Lye, Meda Coffee, MD    Brief Narrative:   Nicole Peters is a 72 y.o. female with past medical history significant for essential pretension, hyperlipidemia, anxiety/depression, osteoporosis, morbid obesity who presented to [facility] ED on 5/29 via EMS with confusion, poor appetite.  Patient with reported fall at home 3 days prior.  On EMS arrival, patient with some confusion and while trying to stand up she experienced a syncopal episode.  Workup in the ED notable for sodium of 128, CO2 15, BUN 115, creatinine 3.05, lactic acid 8.8, WBC 55.6, hemoglobin 4.4.  EDP consulted PCCM and patient was admitted to the intensive care unit under the pulmonary critical care service.  Significant Hospital events: 5/29: Admitted to the PCCM service, hemoglobin 4.4 5/30: Started on low-dose vasopressors, confusion increased, transfusing additional PRBC.  CTA GI given transition to brisk melena with findings of active extravasation from mid gastroduodenal artery, noted transient atrial fibrillation 5/31: IR consulted underwent GDA embolization, intubated postprocedure 6/1: Extubated, vasopressors weaned off 6/4: Transferred to the hospitalist service 6/5: EGD with large duodenal ulcer, nonbleeding 6/6: Transfer to medical floor 6/7: PT recommends home health, OT recommends SNF, Lasix IV x 2 today 6/8: PT now recommending SNF, TOC consulted for placement 6/9-6/10: Medically stable for discharge to SNF once bed available  Assessment & Plan:   Upper GI bleed likely secondary to NSAID induced duodenal ulcer Acute blood loss anemia Hypovolemic shock Patient presenting to ED with confusion, syncopal episode and was found to have a hemoglobin of 4.4.  Etiology likely secondary to NSAID induced duodenal ulcer.  During the hospitalization; bleeding had become more brisk and underwent CT angiogram GI study with  noted active extravasation from mid gastroduodenal artery.  Patient did require vasopressor support due to significant blood loss which was eventually titrated off.  Patient was transfused 2 unit PRBCs on 5/29, 3 units PRBCs on 5/30, 1 unit pRBC and 2 units cryoprecipitate 5/31, 1 unit PRBC 6/2.  Interventional radiology was consulted and patient underwent GDA embolization on 5/31.  GI was consulted and patient underwent EGD on 6/5 with findings of large 2 cm deep with nonbleeding cratered duodenal ulcer with coil first portion of the duodenum with surrounding erythema/edema. -- [facility] following; appreciate assistance -- Hgb 4.4>>7.8>6.2>>8.7>>6.7>7.5>7.5>8.3>8.4>8.2>8.8 -- Protonix 40 mg PO q12h (plan BID dosing on discharge for 12 weeks followed by once daily thereafter) -- Avoidance of NSAIDs x 12 weeks, no aspirin for 4 weeks -- CBC in a.m.  Leukocytosis-resolved No infectious etiology elucidated.  C. difficile and GI PCR panel negative.  Blood cultures x 2 from 5/29 with no growth.  Completed 6-day course of IV antibiotics.  Acute metabolic encephalopathy: Resolved Etiology likely multifactorial in the setting of acute blood loss anemia secondary to upper GI bleed, uremia secondary to renal failure with acidosis.  -- Restart home Xanax 0.25 mg p.o. nightly as needed anxiety  Acute renal failure Etiology likely secondary to prerenal azotemia in the setting of poor oral intake in days preceding hospitalization versus ATN from significant blood loss anemia as above requiring vasopressor support initially while in the intensive care unit.  Renal ultrasound with no acute findings. -- Cr 3.05>>>0.76>0.70>0.63>0.78 -- Continue to hold home benazepril -- Repeat BMP in a.m.  Acute hypoxic respiratory failure Suspect etiology likely related to blood loss and significant volume overload with IV fluids/blood resuscitation during hospitalization.  Received IV Lasix  with improvement.  Chest x-ray 6/6  with no active cardiopulmonary disease process. -- Incentive spirometry -- Continue to wean supplemental oxygen to maintain SpO2 > 92%  Hypothyroidism TSH 15.847, free T4 0.72. -- Levothyroxine 25 mcg p.o. daily  Paroxysmal atrial fibrillation Essential hypertension Home medications include benazepril 20 mg p.o. daily, amlodipine 5 mg p.o. daily.  Patient did require vasopressor support during initial hospitalization due to hypovolemic shock as above. -- BP 133/72 this morning -- Restart home amlodipine 5 mg p.o. daily today -- Continue to hold home benazepril for now -- Anticoagulation contraindicated given GI bleed as above -- Monitor on telemetry  Hyperlipidemia -- Simvastatin 80 mg p.o. daily  Anxiety/depression On Xanax as needed at baseline.  Currently on hold due to encephalopathy as above.  Weakness/debility/deconditioning: -- PT/OT recommending SNF -- [facility] consulted for SNF placement    DVT prophylaxis: SCDs Start: 04/21/23 2120    Code Status: Full Code Family Communication: No family present at bedside this morning  Disposition Plan:  Level of care: Med-Surg Status is: Inpatient Remains inpatient appropriate because: Pending SNF placement, medically stable for discharge once bed available    Consultants:  PCCM: Signed off 6/4 [facility] Interventional radiology  Procedures:  Renal u/s 5/30 >> no acute findings CT angio GI bleed 5/30 >> active extravasation from mid-gastroduodenal artery, coronary calcification, small HH, active hemorrhage from 2nd portion of duodenum, severe sigmoid diverticulosis  Antimicrobials:  Vancomycin 5/29 - 5/29 Cefepime 5/29 - 6/1 Zosyn 6/2 - 6/3 Metronidazole 5/29 - 6/1   Subjective: Patient seen examined bedside, resting comfortably in bed.  No specific complaints,, concerns or questions this morning.    Objective: Vitals:   05/02/23 2055 05/03/23 0531 05/03/23 0700 05/03/23 1421  BP: (!) 128/57 124/63   117/64  Pulse: 80 75  79  Resp: 18 18  18   Temp: 98 F (36.7 C) 98.3 F (36.8 C)  97.8 F (36.6 C)  TempSrc:  Oral  Oral  SpO2: 98% 97%  97%  Weight:   105 kg   Height:        Intake/Output Summary (Last 24 hours) at 05/03/2023 1535 Last data filed at 05/03/2023 0909 Gross per 24 hour  Intake 240 ml  Output 450 ml  Net -210 ml   Filed Weights   04/30/23 0500 05/01/23 0500 05/03/23 0700  Weight: 103 kg 103.1 kg 105 kg    Examination:  Physical Exam: GEN: NAD, alert and oriented x 3, obese HEENT: NCAT, PERRL, EOMI, sclera clear, MMM PULM: CTAB w/o wheezes/crackles, normal respiratory effort, on 2L nasal cannula with SpO2 98% at rest CV: RRR w/o M/G/R GI: abd soft, NTND, NABS, no R/G/M GU: Foley catheter noted in place with clear yellow urine in collection canister MSK: no peripheral edema, moves all extremities independently NEURO: CN II-XII intact, no focal deficits, sensation to light touch intact PSYCH: normal mood/affect Integumentary: Ecchymosis noted to bilateral malar surfaces and nasal bridge, otherwise no other concerning rashes/lesions/wounds noted exposed skin surfaces.      Data Reviewed: I have personally reviewed following labs and imaging studies  CBC: Recent Labs  Lab 04/28/23 0624 04/29/23 0408 04/30/23 0540 05/01/23 0514 05/03/23 0515  WBC 11.6* 10.4 8.3 7.9 8.5  HGB 8.3* 8.5* 8.4* 8.2* 8.8*  HCT 26.5* 27.6* 27.3* 26.4* 28.6*  MCV 98.9 101.1* 101.1* 100.0 99.3  PLT 122* 152 161 171 196   Basic Metabolic Panel: Recent Labs  Lab 04/27/23 0338 04/28/23 6045 04/29/23 0408 04/30/23 0540 05/01/23 4098  05/03/23 0515  NA 136 136 137 138 137 133*  K 3.1* 3.5 3.5 3.8 3.3* 4.1  CL 101 101 101 101 93* 95*  CO2 29 31 30  32 34* 30  GLUCOSE 117* 96 114* 111* 76 83  BUN 12 7* 7* <5* <5* 8  CREATININE 0.95 0.76 0.70 0.63 0.78 0.82  CALCIUM 7.1* 7.4* 7.5* 7.8* 7.5* 8.1*  MG 1.7  --  2.1 2.0 1.8 2.0   GFR: Estimated Creatinine Clearance: 73.2  mL/min (by C-G formula based on SCr of 0.82 mg/dL). Liver Function Tests: No results for input(s): "AST", "ALT", "ALKPHOS", "BILITOT", "PROT", "ALBUMIN" in the last 168 hours.  No results for input(s): "LIPASE", "AMYLASE" in the last 168 hours.  No results for input(s): "AMMONIA" in the last 168 hours.  Coagulation Profile: No results for input(s): "INR", "PROTIME" in the last 168 hours.  Cardiac Enzymes: No results for input(s): "CKTOTAL", "CKMB", "CKMBINDEX", "TROPONINI" in the last 168 hours. BNP (last 3 results) No results for input(s): "PROBNP" in the last 8760 hours. HbA1C: No results for input(s): "HGBA1C" in the last 72 hours. CBG: Recent Labs  Lab 05/02/23 2002 05/03/23 0011 05/03/23 0418 05/03/23 0721 05/03/23 1205  GLUCAP 88 96 83 76 99   Lipid Profile: No results for input(s): "CHOL", "HDL", "LDLCALC", "TRIG", "CHOLHDL", "LDLDIRECT" in the last 72 hours. Thyroid Function Tests: No results for input(s): "TSH", "T4TOTAL", "FREET4", "T3FREE", "THYROIDAB" in the last 72 hours.  Anemia Panel: No results for input(s): "VITAMINB12", "FOLATE", "FERRITIN", "TIBC", "IRON", "RETICCTPCT" in the last 72 hours.  Sepsis Labs: No results for input(s): "PROCALCITON", "LATICACIDVEN" in the last 168 hours.   No results found for this or any previous visit (from the past 240 hour(s)).        Radiology Studies: No results found.      Scheduled Meds:  sodium chloride   Intravenous Once   amLODipine  5 mg Oral Daily   Chlorhexidine Gluconate Cloth  6 each Topical Daily   feeding supplement  237 mL Oral BID BM   levothyroxine  75 mcg Oral Q0600   loratadine  10 mg Oral Daily   montelukast  10 mg Oral QHS   mouth rinse  15 mL Mouth Rinse 4 times per day   pantoprazole  40 mg Oral BID   sodium chloride flush  10-40 mL Intracatheter Q12H   Continuous Infusions:  sodium chloride Stopped (04/23/23 2320)     LOS: 12 days    Time spent: 45 minutes spent on chart  review, discussion with nursing staff, consultants, updating family and interview/physical exam; more than 50% of that time was spent in counseling and/or coordination of care.    Teale Goodgame Hoover Brunette, DO [facility] Available via Epic secure chat 7am-7pm After these hours, please refer to coverage provider listed on [email or web address] 05/03/2023, 3:35 PM

## 2023-05-03 NOTE — Care Management Important Message (Signed)
Important Message  Patient Details IM Letter given. Name: MERIBETH VITUG MRN: [id number] Date of Birth: 12/17/1950   Medicare Important Message Given:  Yes     Caren Macadam 05/03/2023, 9:20 AM

## 2023-05-04 ENCOUNTER — Encounter: Payer: Self-pay | Admitting: Gastroenterology

## 2023-05-04 DIAGNOSIS — D62 Acute posthemorrhagic anemia: Secondary | ICD-10-CM | POA: Diagnosis not present

## 2023-05-04 LAB — BASIC METABOLIC PANEL
Anion gap: 9 (ref 5–15)
BUN: 9 mg/dL (ref 8–23)
CO2: 29 mmol/L (ref 22–32)
Calcium: 8.6 mg/dL — ABNORMAL LOW (ref 8.9–10.3)
Chloride: 95 mmol/L — ABNORMAL LOW (ref 98–111)
Creatinine, Ser: 0.91 mg/dL (ref 0.44–1.00)
GFR, Estimated: >60 mL/min (ref >60)
Glucose, Bld: 85 mg/dL (ref 70–99)
Potassium: 4.1 mmol/L (ref 3.5–5.1)
Sodium: 133 mmol/L — ABNORMAL LOW (ref 135–145)

## 2023-05-04 LAB — CBC
HCT: 31.1 % — ABNORMAL LOW (ref 36.0–46.0)
Hemoglobin: 9.8 g/dL — ABNORMAL LOW (ref 12.0–15.0)
MCH: 30.9 pg (ref 26.0–34.0)
MCHC: 31.5 g/dL (ref 30.0–36.0)
MCV: 98.1 fL (ref 80.0–100.0)
Platelets: 232 10*3/uL (ref 150–400)
RBC: 3.17 MIL/uL — ABNORMAL LOW (ref 3.87–5.11)
RDW: 15.6 % — ABNORMAL HIGH (ref 11.5–15.5)
WBC: 9.1 10*3/uL (ref 4.0–10.5)
nRBC: 0 % (ref 0.0–0.2)

## 2023-05-04 LAB — GLUCOSE, CAPILLARY: Glucose-Capillary: 78 mg/dL (ref 70–99)

## 2023-05-04 LAB — MAGNESIUM: Magnesium: 2 mg/dL (ref 1.7–2.4)

## 2023-05-04 MED ORDER — GUAIFENESIN ER 600 MG PO TB12
600.0000 mg | ORAL_TABLET | Freq: Two times a day (BID) | ORAL | Status: DC
  Administered 2023-05-04 – 2023-05-05 (×3): 600 mg via ORAL
  Filled 2023-05-04 (×3): qty 1

## 2023-05-04 MED ORDER — IPRATROPIUM-ALBUTEROL 0.5-2.5 (3) MG/3ML IN SOLN
3.0000 mL | Freq: Four times a day (QID) | RESPIRATORY_TRACT | Status: DC
  Administered 2023-05-04 – 2023-05-05 (×4): 3 mL via RESPIRATORY_TRACT
  Filled 2023-05-04 (×4): qty 3

## 2023-05-04 MED ORDER — MOMETASONE FURO-FORMOTEROL FUM 100-5 MCG/ACT IN AERO
2.0000 | INHALATION_SPRAY | Freq: Two times a day (BID) | RESPIRATORY_TRACT | Status: DC
  Administered 2023-05-04 – 2023-05-05 (×3): 2 via RESPIRATORY_TRACT
  Filled 2023-05-04: qty 8.8

## 2023-05-04 NOTE — Progress Notes (Signed)
Mobility Specialist - Progress Note   05/04/23 1334  Mobility  Activity Ambulated with assistance in hallway  Level of Assistance Standby assist, set-up cues, supervision of patient - no hands on  Assistive Device Front wheel walker  Distance Ambulated (ft) 80 ft  Activity Response Tolerated well  Mobility Referral Yes  $Mobility charge 1 Mobility  Mobility Specialist Start Time (ACUTE ONLY) 0121  Mobility Specialist Stop Time (ACUTE ONLY) 0127  Mobility Specialist Time Calculation (min) (ACUTE ONLY) 6 min   Pt received in recliner and agreeable to mobility. No complaints during session. Pt to recliner after session with all needs met.    Post-mobility: 97% SPO2  Chief Technology Officer

## 2023-05-04 NOTE — TOC Progression Note (Addendum)
Transition of Care [facility]) - Progression Note    Patient Details  Name: Nicole Peters MRN: [id number] Date of Birth: [birth year]  Transition of Care [facility]) CM/SW Contact  Beckie Busing, RN Phone Number:[phone]  05/04/2023, 10:22 AM  Clinical Narrative:    CM has called admissions director Meryle Ready to determine if the facility can make bed offer.There is no answer, message has been left.   1300 Second attempt to contact [facility]. CM received message from Meryle Ready stating that she in on vacation. CM has called Contact provided by Meryle Ready. CM has been told that this person Eber Jones ) is on break. Voicemail has been left. If no response shortly CM will speak with patient and family.   1512 CM received return call from Garth Schlatter at [facility] who is covering for [employer]. Facility will be able to extend bed offer. Facility can accept patient in the am. CM at bedside to update patient and husband. Husband stated that he will transport patient to facility. Discharge packet is at nurses station.  Expected Discharge Plan:  (unknown at this time) Barriers to Discharge: Continued Medical Work up  Expected Discharge Plan and Services In-house Referral: NA Discharge Planning Services: CM Consult Post Acute Care Choice: NA Living arrangements for the past 2 months: Single Family Home Expected Discharge Date: 05/03/23               DME Arranged: N/A DME Agency: NA       HH Arranged: NA HH Agency: NA         Social Determinants of Health (SDOH) Interventions SDOH Screenings   Housing: Patient Unable To Answer (04/22/2023)  Depression (PHQ2-9): Low Risk  (09/16/2019)  Tobacco Use: Medium Risk (05/02/2023)    Readmission Risk Interventions    04/27/2023   11:57 AM 04/22/2023    5:42 PM  Readmission Risk Prevention Plan  Post Dischage Appt Complete   Transportation Screening  Complete  PCP or Specialist Appt within 5-7 Days  Complete  Home Care Screening  Complete   Medication Review (RN CM)  Complete

## 2023-05-04 NOTE — Progress Notes (Signed)
[facility] Progress Note  Patient: Nicole Peters     WUJ:[id number]  DOA: 04/21/2023   PCP: Lezlie Lye, Meda Coffee, MD       Brief hospital course: This is a 72 year old female with hypertension, hyperlipidemia, morbid obesity, anxiety and depression who presented to the hospital with confusion and poor appetite. While EMS was present, the patient experienced a syncopal episode when trying to stand. In the ED, noted to have tachypnea, a hemoglobin of 4.4, lactic acid 8.8, creatinine 3.05 and a WBC count of 55.6. The patient was admitted to the critical care service and started on vasopressors.  5/30-melena-noted to have bleeding from the gastroduodenal artery on CTA 5/31 gastroduodenal artery embolization-intubated 6/1 extubated and vasopressors weaned off 6/5 EGD noted large duodenal ulcer which was not bleeding at the time  Diet advance and hemoglobin has been stable at around 8-9. Plan for SNF when stable.   Subjective:  Noted to be short of breath. Admits to having asthma but does not feel inhalers to anything so does not take them.   Assessment and Plan: Principal Problem:   ABLA (acute blood loss anemia) with hemorrhagic shock and syncope PUD -Hemoglobin 4.4-status post 7 units of packed red blood cells and 2 units of cryoprecipitate -Vasopressors were needed - Bleeding resolved with embolization of the GDA and hemoglobin has stabilized -Duodenal ulcer being treated with twice daily Protonix -Patient has been recommended to avoid NSAIDs (takes Mobic at home) for 12 weeks -She also takes aspirin for atrial fibrillation-  this should be held for 4 weeks  Active Problems:  Acute resp failure- h/o asthma - noted to have bl wheezing and dyspnea at rest today - hypoxic to 80s on room air per prior notes ( 82 % on RA yesterday and requiring 2 L) - start DuoNebs and Dulera and follow    Encephalopathy acute - Resolved  Acute renal failure -Likely prerenal and  related to poor oral intake prior to admission along with severe blood loss -Renal ultrasound has been completed and was unrevealing - Creatinine 3.05 when admitted has improved to 0.91 with volume replacement  Paroxysmal atrial fibrillation - She was taking a baby aspirin daily at home - Currently recommended to hold off of this for total of 4 weeks -Was not on any rate controlling agents at home  Hypertension - Amlodipine has been resumed - Benazepril remains on hold -BP is not elevated    Leukocytosis -Severe leukocytosis noted with a WBC count of 55.6 - Has improved to to 9.1 -No source was found -blood cultures negative, UA negative and C. difficile and GI pathogen panel negative, no respiratory infection found - The patient did receive IV antibiotics and completed a 6-day course  Elevated TSH - TSH noted to be 15.847 - Free T4 normal at 0.72 - Recommend close follow-up as outpatient       Code Status: Full Code Consultants: Interventional radiology, GI Level of Care: Level of care: Med-Surg Total time on patient care: 35 min DVT prophylaxis:  SCDs Start: 04/21/23 2120     Objective:   Vitals:   05/03/23 0531 05/03/23 0700 05/03/23 1421 05/03/23 2012  BP: 124/63  117/64 126/72  Pulse: 75  79 80  Resp: 18  18 17   Temp: 98.3 F (36.8 C)  97.8 F (36.6 C) 97.8 F (36.6 C)  TempSrc: Oral  Oral Oral  SpO2: 97%  97% 98%  Weight:  105 kg    Height:  Filed Weights   04/30/23 0500 05/01/23 0500 05/03/23 0700  Weight: 103 kg 103.1 kg 105 kg   Exam: General exam: Appears comfortable  HEENT: oral mucosa moist Respiratory system: has wheezing throughout lung fields- noted to have dyspnea on exam from just ambulating to the bathroom  Cardiovascular system: S1 & S2 heard  Gastrointestinal system: Abdomen soft, non-tender, nondistended. Normal bowel sounds   Extremities: No cyanosis, clubbing or edema Psychiatry:  Mood & affect appropriate.       CBC: Recent Labs  Lab 04/29/23 0408 04/30/23 0540 05/01/23 0514 05/03/23 0515 05/04/23 0327  WBC 10.4 8.3 7.9 8.5 9.1  HGB 8.5* 8.4* 8.2* 8.8* 9.8*  HCT 27.6* 27.3* 26.4* 28.6* 31.1*  MCV 101.1* 101.1* 100.0 99.3 98.1  PLT 152 161 171 196 232   Basic Metabolic Panel: Recent Labs  Lab 04/29/23 0408 04/30/23 0540 05/01/23 0514 05/03/23 0515 05/04/23 0327  NA 137 138 137 133* 133*  K 3.5 3.8 3.3* 4.1 4.1  CL 101 101 93* 95* 95*  CO2 30 32 34* 30 29  GLUCOSE 114* 111* 76 83 85  BUN 7* <5* <5* 8 9  CREATININE 0.70 0.63 0.78 0.82 0.91  CALCIUM 7.5* 7.8* 7.5* 8.1* 8.6*  MG 2.1 2.0 1.8 2.0 2.0   GFR: Estimated Creatinine Clearance: 66 mL/min (by C-G formula based on SCr of 0.91 mg/dL).  Scheduled Meds:  sodium chloride   Intravenous Once   amLODipine  5 mg Oral Daily   Chlorhexidine Gluconate Cloth  6 each Topical Daily   feeding supplement  237 mL Oral BID BM   levothyroxine  75 mcg Oral Q0600   loratadine  10 mg Oral Daily   montelukast  10 mg Oral QHS   mouth rinse  15 mL Mouth Rinse 4 times per day   pantoprazole  40 mg Oral BID   sodium chloride flush  10-40 mL Intracatheter Q12H   Continuous Infusions:  sodium chloride Stopped (04/23/23 2320)   Imaging and lab data was personally reviewed No results found.  LOS: 13 days   Author: Calvert Cantor  05/04/2023 8:17 AM  To contact [facility]>   Check the care team in [facility] and look for the attending/consulting [facility] provider listed  Log into [email or web address] and use [place] universal password   Go to> "[facility]"  and find provider  If you still have difficulty reaching the provider, please page the [facility] (Director on Call) for the Hospitalists listed on amion

## 2023-05-05 DIAGNOSIS — D62 Acute posthemorrhagic anemia: Secondary | ICD-10-CM | POA: Diagnosis not present

## 2023-05-05 NOTE — Progress Notes (Signed)
Physical Therapy Treatment Patient Details Name: Nicole Peters MRN: [id number] DOB: 11/19/1951 Today's Date: 05/05/2023   History of Present Illness 72 y.o. female who presented to [facility] ED on 5/29 via EMS with confusion, weakness, fall, Hgb 4.4.  Pt admitted with upper GI bleed likely secondary to NSAID induced duodenal ulcer, acute blood loss anemia, and hypovolemic shock. On 5/31 IR consulted and underwent GDA embolization and intubated 04/23/23-04/24/23 post procedure.  PMH: essential pretension, hyperlipidemia, anxiety/depression, osteoporosis, morbid obesity    PT Comments     Pt admitted with above diagnosis.  Pt currently with functional limitations due to the deficits listed below (see PT Problem List).  Pt in bed when PT arrived. 91% on RA at rest. Pt reports she anticipates d/c to SNF today. Pt sates she has come a long way and thinks she could probably go home. PT provided ed on importance of maximizing safety and I prior to d/c home especially with husband unable to provide S and A. Pt demonstrated verbal understanding. Pt S for supine to sit with HOB elevated and use of bed rail, STS from EOB with min cues and min guard, gait tasks 80 feet with min guard and cues for proper body position and coordinated breathing. Pt continues to desaturate with exertion (see below) and will benefit from ed on energy conservation and breathing strategies. Pt elected to sit in recliner, all needs in place and 94% on RA.   Pt will benefit from acute skilled PT to increase their independence and safety with mobility to allow discharge.     Recommendations for follow up therapy are one component of a multi-disciplinary discharge planning process, led by the attending physician.  Recommendations may be updated based on patient status, additional functional criteria and insurance authorization.  Follow Up Recommendations  Can patient physically be transported by private vehicle: Yes     Assistance Recommended at Discharge Intermittent Supervision/Assistance  Patient can return home with the following A little help with walking and/or transfers;A little help with bathing/dressing/bathroom;Assistance with cooking/housework;Help with stairs or ramp for entrance   Equipment Recommendations  None recommended by PT    Recommendations for Other Services       Precautions / Restrictions Precautions Precautions: Fall Restrictions Weight Bearing Restrictions: No     Mobility  Bed Mobility Overal bed mobility: Needs Assistance Bed Mobility: Supine to Sit     Supine to sit: HOB elevated, Supervision     General bed mobility comments: use of bed rail    Transfers Overall transfer level: Needs assistance Equipment used: Rolling walker (2 wheels) Transfers: Sit to/from Stand Sit to Stand: Min guard           General transfer comment: min cues for proper UE placement. pt indicates she is feeling better today and notices a large improvement from last week    Ambulation/Gait Ambulation/Gait assistance: Min guard Gait Distance (Feet): 80 Feet Assistive device: Rolling walker (2 wheels) Gait Pattern/deviations: Step-to pattern, Wide base of support Gait velocity: decreased     General Gait Details: On RA and pt desaturated to 83% with elevated PR of 127, pt requried seated theraputic rest break and coaching on pursed lip breathing and able to recover to 92% on RA in 1:32, pt required cues for proper body position and extension posture   Stairs             Wheelchair Mobility    Modified Rankin (Stroke Patients Only)  Balance Overall balance assessment: Needs assistance, History of Falls Sitting-balance support: Feet supported, No upper extremity supported Sitting balance-Leahy Scale: Good     Standing balance support: During functional activity, Reliant on assistive device for balance Standing balance-Leahy Scale: Poor Standing balance  comment: no LOB with transfers or gait task s                            Cognition Arousal/Alertness: Awake/alert Behavior During Therapy: WFL for tasks assessed/performed Overall Cognitive Status: Within Functional Limits for tasks assessed                                          Exercises      General Comments        Pertinent Vitals/Pain Pain Assessment Pain Assessment: No/denies pain    Home Living                          Prior Function            PT Goals (current goals can now be found in the care plan section) Acute Rehab PT Goals Patient Stated Goal: get better PT Goal Formulation: With patient Time For Goal Achievement: 05/13/23 Potential to Achieve Goals: Good Progress towards PT goals: Progressing toward goals    Frequency    Min 1X/week      PT Plan Current plan remains appropriate    Co-evaluation              AM-PAC PT "6 Clicks" Mobility   Outcome Measure  Help needed turning from your back to your side while in a flat bed without using bedrails?: None Help needed moving from lying on your back to sitting on the side of a flat bed without using bedrails?: A Little Help needed moving to and from a bed to a chair (including a wheelchair)?: A Little Help needed standing up from a chair using your arms (e.g., wheelchair or bedside chair)?: A Little Help needed to walk in hospital room?: A Little Help needed climbing 3-5 steps with a railing? : Total 6 Click Score: 17    End of Session Equipment Utilized During Treatment: Gait belt Activity Tolerance: Patient limited by fatigue (SOB) Patient left: in chair;with call bell/phone within reach Nurse Communication: Mobility status PT Visit Diagnosis: Other abnormalities of gait and mobility (R26.89);Difficulty in walking, not elsewhere classified (R26.2)     Time: 1005-1017 PT Time Calculation (min) (ACUTE ONLY): 12 min  Charges:  $Gait Training:  8-22 mins                     Johnny Bridge, PT Acute Rehab    Nicole Peters 05/05/2023, 11:51 AM

## 2023-05-05 NOTE — TOC Transition Note (Signed)
Transition of Care [facility]) - CM/SW Discharge Note   Patient Details  Name: [name] MRN: [id number] Date of Birth: 09/23/1951  [facility] CM/SW Contact:  Erin Sons, LCSW Phone Number: 05/05/2023, 12:23 PM   Clinical Narrative:     Patient will DC to: [facility] Anticipated DC date: 05/05/23 Family notified: Spouse Transport by: Spouse   Per MD patient ready for DC to [facility]. RN, patient, patient's family, and facility notified of DC. Discharge Summary and FL2 sent to facility. RN to call report prior to discharge [phone]). DC packet on chart.   CSW will sign off for now as social work intervention is no longer needed. Please consult [place] again if new needs arise.   Final next level of care: Skilled Nursing Facility Barriers to Discharge: No Barriers Identified   Patient Goals and CMS Choice CMS Medicare.gov Compare Post Acute Care list provided to:: Patient Choice offered to / list presented to : Patient  Discharge Placement                Patient chooses bed at: [facility] Patient to be transferred to facility by: Spouse Name of family member notified: Spouse Patient and family notified of of transfer: 05/05/23  Discharge Plan and Services Additional resources added to the After Visit Summary for   In-house Referral: NA Discharge Planning Services: CM Consult Post Acute Care Choice: NA          DME Arranged: N/A DME Agency: NA       HH Arranged: NA HH Agency: NA        Social Determinants of Health (SDOH) Interventions SDOH Screenings   Housing: Patient Unable To Answer (04/22/2023)  Depression (PHQ2-9): Low Risk  (09/16/2019)  Tobacco Use: Medium Risk (05/02/2023)     Readmission Risk Interventions    04/27/2023   11:57 AM 04/22/2023    5:42 PM  Readmission Risk Prevention Plan  Post Dischage Appt Complete   Transportation Screening  Complete  PCP or Specialist Appt within 5-7 Days  Complete  Home  Care Screening  Complete  Medication Review (RN CM)  Complete

## 2023-05-05 NOTE — Discharge Summary (Signed)
Physician Discharge Summary  Nicole Peters ZOX:[id number] DOB: 07/10/1951 DOA: 04/21/2023  PCP: Lezlie Lye, Meda Coffee, MD  Admit date: 04/21/2023 Discharge date: 05/05/2023  Admitted From: Home Disposition:  SNF  Recommendations for Outpatient Follow-up:  Follow up with PCP in 1-2 weeks Follow up with GI as scheduled:  Discharge Condition:Stable  CODE STATUS:Full  Diet recommendation: Regular diet as tolerated    Brief/Interim Summary: This is a 72 year old female with hypertension, hyperlipidemia, morbid obesity, anxiety and depression who presented to the hospital with confusion and poor appetite. While EMS was present, the patient experienced a syncopal episode when trying to stand. In the ED, noted to have tachypnea, a hemoglobin of 4.4, lactic acid 8.8, creatinine 3.05 and a WBC count of 55.6.  The patient was admitted to the critical care service and initially started on vasopressors.  5/30-melena-noted to have bleeding from the gastroduodenal artery on CTA 5/31 gastroduodenal artery embolization-intubated 6/1 extubated and vasopressors weaned off 6/5 EGD noted large duodenal ulcer which was not bleeding at the time 6/6 - present: Diet advance and hemoglobin has been stable at around 8-9.  6/12 - Accepted to SNF  Discharge Diagnoses:  Principal Problem:   ABLA (acute blood loss anemia) Active Problems:   Encephalopathy acute   Anemia   Leukocytosis   Syncope   Hypotension due to hypovolemia   Shock (HCC)   Hemorrhagic shock (HCC)   Gastrointestinal hemorrhage   Endotracheally intubated   Thrombocytopenia (HCC)   AKI (acute kidney injury) (HCC)  ABLA (acute blood loss anemia) with hemorrhagic shock and syncope PUD -Hemoglobin stable -Pressors weaned off 6/1 -Bleeding resolved s/p embolization and endoscopy confirmed -Patient has been recommended to avoid NSAIDs (takes Mobic at home) for 12 weeks -She also takes aspirin for atrial fibrillation-  this should be  held for 4 weeks   Asthma exacerbation ruled out - Transient wheeze resolved within a few hours with nebs - Currently on room air   Encephalopathy acute - Metabolic, likely secondary to hemorrhagic shock/Syncope; Resolved   Acute renal failure, resolved -Likely prerenal given profound anemia   Paroxysmal atrial fibrillation, rate controlled -Hold aspirin - minimum 4 weeks.   Hypertension, essential, controlled - Amlodipine has been resumed; DC benazepril  Reactive leukocytosis -Severe leukocytosis noted with a WBC count of 55.6 -Resolved with 6 day course of antibiotics; cultures/imaging unremarkable for infectious process   Elevated TSH - TSH noted to be 15.847 - Free T4 normal at 0.72 - Recommend close follow-up as outpatient  Discharge Instructions  Discharge Instructions     Diet - low sodium heart healthy   Complete by: As directed    Increase activity slowly   Complete by: As directed    No wound care   Complete by: As directed    PICC line removal   Complete by: As directed       Allergies as of 05/05/2023   No Known Allergies      Medication List     STOP taking these medications    aspirin 81 MG chewable tablet   benazepril 20 MG tablet Commonly known as: LOTENSIN   meloxicam 15 MG tablet Commonly known as: MOBIC       TAKE these medications    alendronate 70 MG tablet Commonly known as: FOSAMAX Take 1 tablet (70 mg total) by mouth every 7 (seven) days. Take with a full glass of water on an empty stomach.   ALPRAZolam 0.25 MG tablet Commonly known as: XANAX Take 1  tablet (0.25 mg total) by mouth at bedtime as needed for anxiety. What changed: how much to take   amLODipine 5 MG tablet Commonly known as: NORVASC Take 5 mg by mouth daily.   cetirizine 10 MG tablet Commonly known as: ZYRTEC Take 10 mg by mouth daily as needed for allergies.   feeding supplement Liqd Take 237 mLs by mouth 2 (two) times daily between meals.    levothyroxine 50 MCG tablet Commonly known as: SYNTHROID Take 75 mcg by mouth every morning.   montelukast 10 MG tablet Commonly known as: SINGULAIR Take 10 mg by mouth at bedtime.   multivitamin tablet Take 1 tablet by mouth daily.   pantoprazole 40 MG tablet Commonly known as: PROTONIX Take 1 tablet (40 mg total) by mouth 2 (two) times daily.   simvastatin 80 MG tablet Commonly known as: ZOCOR Take 80 mg by mouth daily.   Vitamin D3 50 MCG (2000 UT) capsule Take 2,000 Units by mouth daily.        Follow-up Information     Esterwood, Amy S, PA-C Follow up on 06/29/2023.   Specialty: Gastroenterology Why: appt is at 3pm. please arrive 10-15 minutes prior to appt time. Contact information: [address] [place] [address] [phone]         [facility] Follow up.   Specialty: Home Health Services Why: Your home health has been set up with [facility]. The office will call you with start on service information after you are discharged. If you have any questions or concerns  please call the number listed above. Contact information: [address] [place] [address] [phone]                No Known Allergies  Consultations: GI, IR   Procedures/Studies: DG CHEST PORT 1 VIEW  Result Date: 04/29/2023 CLINICAL DATA:  Shortness of breath EXAM: PORTABLE CHEST 1 VIEW COMPARISON:  04/26/2023 FINDINGS: Check shadow is stable. Aortic calcifications are noted. Right PICC is noted in satisfactory position. Lungs are clear. No bony abnormality is noted. IMPRESSION: No active disease. Electronically Signed   By: Alcide Clever M.D.   On: 04/29/2023 02:54   DG Knee 1-2 Views Right  Result Date: 04/26/2023 CLINICAL DATA:  Trauma, fall EXAM: RIGHT KNEE - 1-2 VIEW COMPARISON:  04/21/2023 FINDINGS: There is previous right knee arthroplasty. No recent fracture or dislocation is seen. There is no significant effusion in suprapatellar bursa.  Scattered vascular calcifications are seen in soft tissues. IMPRESSION: No recent fracture or dislocation is seen. Previous right knee arthroplasty there Electronically Signed   By: Ernie Avena M.D.   On: 04/26/2023 09:32   DG Pelvis 1-2 Views  Result Date: 04/26/2023 CLINICAL DATA:  Fall EXAM: PELVIS - 1-2 VIEW COMPARISON:  CTA GI bleed 04/22/2023 FINDINGS: No fracture or dislocation. Soft tissues are unremarkable. Cholecystectomy clips and GDA embolization coils seen in the upper quadrant. Advanced degenerative changes of the lower lumbar spine. IMPRESSION: No acute abnormality of the pelvis. Electronically Signed   By: Acquanetta Belling M.D.   On: 04/26/2023 09:21   DG CHEST PORT 1 VIEW  Result Date: 04/26/2023 CLINICAL DATA:  Tachypnea EXAM: PORTABLE CHEST - 1 VIEW COMPARISON:  04/23/2023 FINDINGS: Cardiomediastinal silhouette and pulmonary vasculature are within normal limits. Interval extubation. Hazy opacities at the right lung base are new since prior examination and may be related to developing pneumonia. Right upper extremity PICC terminates at the cavoatrial junction. IMPRESSION: New hazy opacities  at the right lung base may be due to atelectasis or pneumonia. Electronically Signed   By: Acquanetta Belling M.D.   On: 04/26/2023 09:18   DG Knee 1-2 Views Left  Result Date: 04/26/2023 CLINICAL DATA:  Fall EXAM: LEFT KNEE - 1-2 VIEW COMPARISON:  04/21/2023 FINDINGS: Left total knee prosthesis is well seated without periprosthetic fracture or lucency. Small knee joint effusion is present. IMPRESSION: Uncomplicated left total knee prosthesis. Electronically Signed   By: Acquanetta Belling M.D.   On: 04/26/2023 09:16   IR EMBO TUMOR ORGAN ISCHEMIA INFARCT INC GUIDE ROADMAPPING  Result Date: 04/23/2023 INDICATION: 72 year old woman with acute upper GI bleed resulting in hemodynamic instability. Interventional radiology consulted angiogram and gastroduodenal artery embolization. EXAM: 1. Ultrasound-guided  access of right common femoral artery. 2. Celiac arteriogram. 3. Common hepatic arteriogram. 4. Gastro duodenal arteriogram and coil embolization. 5. Angio-Seal closure of right common femoral artery. MEDICATIONS: None ANESTHESIA/SEDATION: None CONTRAST:  60 mL Omnipaque 300 intra-arterial FLUOROSCOPY: Radiation Exposure Index (as provided by the fluoroscopic device): 1013 mGy Kerma COMPLICATIONS: None immediate. PROCEDURE: Informed consent was obtained from the patient's husband Jonny Ruiz following explanation of the procedure, risks, benefits and alternatives. The patient understands, agrees and consents for the procedure. All questions were addressed. A time out was performed prior to the initiation of the procedure. Maximal barrier sterile technique utilized including caps, mask, sterile gowns, sterile gloves, large sterile drape, hand hygiene, and chlorhexidine prep. Ultrasound image documenting patency of the right common femoral artery was obtained and placed in permanent medical record. Sterile ultrasound probe cover and gel utilized throughout the procedure. Utilizing continuous ultrasound guidance, the right common femoral artery was accessed at the level of the femoral head with a 21 gauge needle. 21 gauge needle exchanged for a transitional dilator set over 0.018 inch guidewire. Transitional dilator set exchanged for 5 French sheath over 0.035 inch guidewire. Celiac artery selected with Sos Omni catheter. Celiac angiogram showed patent splenic, left gastric, and common hepatic artery branches. Mild irregularity of the distal gastro duodenal artery was noted. No active extravasation was seen. The common hepatic artery was then selected with Progreat microcatheter. Angiogram demonstrated patent gastro duodenal and proper hepatic arteries. Gastro duodenal artery was selected with Progreat microcatheter. Angiogram was performed, however no active extravasation was identified. Coil embolization of the gastro  duodenal artery was performed with a combination of 3 and 4 mm Ruby coils. Post celiac angiogram showed significantly decreased GDA flow with no active extravasation. Sheath angiogram showed appropriate puncture of the common femoral artery at the level of the femoral head. The groin was re-prepped and draped and closure was performed with 6 [place] Angio-Seal device. Two additional minutes of manual compression applied at the access site for hemostasis. IMPRESSION: Gastroduodenal artery embolization performed for treatment of acute severe upper GI bleed. Electronically Signed   By: Acquanetta Belling M.D.   On: 04/23/2023 11:45   IR US Guide Vasc Access Right  Result Date: 04/23/2023 INDICATION: 72 year old woman with acute upper GI bleed resulting in hemodynamic instability. Interventional radiology consulted angiogram and gastroduodenal artery embolization. EXAM: 1. Ultrasound-guided access of right common femoral artery. 2. Celiac arteriogram. 3. Common hepatic arteriogram. 4. Gastro duodenal arteriogram and coil embolization. 5. Angio-Seal closure of right common femoral artery. MEDICATIONS: None ANESTHESIA/SEDATION: None CONTRAST:  60 mL Omnipaque 300 intra-arterial FLUOROSCOPY: Radiation Exposure Index (as provided by the fluoroscopic device): 1013 mGy Kerma COMPLICATIONS: None immediate. PROCEDURE: Informed consent was obtained from the patient's husband Jonny Ruiz following explanation of  the procedure, risks, benefits and alternatives. The patient understands, agrees and consents for the procedure. All questions were addressed. A time out was performed prior to the initiation of the procedure. Maximal barrier sterile technique utilized including caps, mask, sterile gowns, sterile gloves, large sterile drape, hand hygiene, and chlorhexidine prep. Ultrasound image documenting patency of the right common femoral artery was obtained and placed in permanent medical record. Sterile ultrasound probe cover and gel utilized  throughout the procedure. Utilizing continuous ultrasound guidance, the right common femoral artery was accessed at the level of the femoral head with a 21 gauge needle. 21 gauge needle exchanged for a transitional dilator set over 0.018 inch guidewire. Transitional dilator set exchanged for 5 French sheath over 0.035 inch guidewire. Celiac artery selected with Sos Omni catheter. Celiac angiogram showed patent splenic, left gastric, and common hepatic artery branches. Mild irregularity of the distal gastro duodenal artery was noted. No active extravasation was seen. The common hepatic artery was then selected with Progreat microcatheter. Angiogram demonstrated patent gastro duodenal and proper hepatic arteries. Gastro duodenal artery was selected with Progreat microcatheter. Angiogram was performed, however no active extravasation was identified. Coil embolization of the gastro duodenal artery was performed with a combination of 3 and 4 mm Ruby coils. Post celiac angiogram showed significantly decreased GDA flow with no active extravasation. Sheath angiogram showed appropriate puncture of the common femoral artery at the level of the femoral head. The groin was re-prepped and draped and closure was performed with 6 [place] Angio-Seal device. Two additional minutes of manual compression applied at the access site for hemostasis. IMPRESSION: Gastroduodenal artery embolization performed for treatment of acute severe upper GI bleed. Electronically Signed   By: Acquanetta Belling M.D.   On: 04/23/2023 11:45   IR Angiogram Visceral Selective  Result Date: 04/23/2023 INDICATION: 72 year old woman with acute upper GI bleed resulting in hemodynamic instability. Interventional radiology consulted angiogram and gastroduodenal artery embolization. EXAM: 1. Ultrasound-guided access of right common femoral artery. 2. Celiac arteriogram. 3. Common hepatic arteriogram. 4. Gastro duodenal arteriogram and coil embolization. 5.  Angio-Seal closure of right common femoral artery. MEDICATIONS: None ANESTHESIA/SEDATION: None CONTRAST:  60 mL Omnipaque 300 intra-arterial FLUOROSCOPY: Radiation Exposure Index (as provided by the fluoroscopic device): 1013 mGy Kerma COMPLICATIONS: None immediate. PROCEDURE: Informed consent was obtained from the patient's husband Jonny Ruiz following explanation of the procedure, risks, benefits and alternatives. The patient understands, agrees and consents for the procedure. All questions were addressed. A time out was performed prior to the initiation of the procedure. Maximal barrier sterile technique utilized including caps, mask, sterile gowns, sterile gloves, large sterile drape, hand hygiene, and chlorhexidine prep. Ultrasound image documenting patency of the right common femoral artery was obtained and placed in permanent medical record. Sterile ultrasound probe cover and gel utilized throughout the procedure. Utilizing continuous ultrasound guidance, the right common femoral artery was accessed at the level of the femoral head with a 21 gauge needle. 21 gauge needle exchanged for a transitional dilator set over 0.018 inch guidewire. Transitional dilator set exchanged for 5 French sheath over 0.035 inch guidewire. Celiac artery selected with Sos Omni catheter. Celiac angiogram showed patent splenic, left gastric, and common hepatic artery branches. Mild irregularity of the distal gastro duodenal artery was noted. No active extravasation was seen. The common hepatic artery was then selected with Progreat microcatheter. Angiogram demonstrated patent gastro duodenal and proper hepatic arteries. Gastro duodenal artery was selected with Progreat microcatheter. Angiogram was performed, however no active extravasation was identified.  Coil embolization of the gastro duodenal artery was performed with a combination of 3 and 4 mm Ruby coils. Post celiac angiogram showed significantly decreased GDA flow with no active  extravasation. Sheath angiogram showed appropriate puncture of the common femoral artery at the level of the femoral head. The groin was re-prepped and draped and closure was performed with 6 [place] Angio-Seal device. Two additional minutes of manual compression applied at the access site for hemostasis. IMPRESSION: Gastroduodenal artery embolization performed for treatment of acute severe upper GI bleed. Electronically Signed   By: Acquanetta Belling M.D.   On: 04/23/2023 11:45   IR Angiogram Selective Each Additional Vessel  Result Date: 04/23/2023 INDICATION: 72 year old woman with acute upper GI bleed resulting in hemodynamic instability. Interventional radiology consulted angiogram and gastroduodenal artery embolization. EXAM: 1. Ultrasound-guided access of right common femoral artery. 2. Celiac arteriogram. 3. Common hepatic arteriogram. 4. Gastro duodenal arteriogram and coil embolization. 5. Angio-Seal closure of right common femoral artery. MEDICATIONS: None ANESTHESIA/SEDATION: None CONTRAST:  60 mL Omnipaque 300 intra-arterial FLUOROSCOPY: Radiation Exposure Index (as provided by the fluoroscopic device): 1013 mGy Kerma COMPLICATIONS: None immediate. PROCEDURE: Informed consent was obtained from the patient's husband Jonny Ruiz following explanation of the procedure, risks, benefits and alternatives. The patient understands, agrees and consents for the procedure. All questions were addressed. A time out was performed prior to the initiation of the procedure. Maximal barrier sterile technique utilized including caps, mask, sterile gowns, sterile gloves, large sterile drape, hand hygiene, and chlorhexidine prep. Ultrasound image documenting patency of the right common femoral artery was obtained and placed in permanent medical record. Sterile ultrasound probe cover and gel utilized throughout the procedure. Utilizing continuous ultrasound guidance, the right common femoral artery was accessed at the level of the  femoral head with a 21 gauge needle. 21 gauge needle exchanged for a transitional dilator set over 0.018 inch guidewire. Transitional dilator set exchanged for 5 French sheath over 0.035 inch guidewire. Celiac artery selected with Sos Omni catheter. Celiac angiogram showed patent splenic, left gastric, and common hepatic artery branches. Mild irregularity of the distal gastro duodenal artery was noted. No active extravasation was seen. The common hepatic artery was then selected with Progreat microcatheter. Angiogram demonstrated patent gastro duodenal and proper hepatic arteries. Gastro duodenal artery was selected with Progreat microcatheter. Angiogram was performed, however no active extravasation was identified. Coil embolization of the gastro duodenal artery was performed with a combination of 3 and 4 mm Ruby coils. Post celiac angiogram showed significantly decreased GDA flow with no active extravasation. Sheath angiogram showed appropriate puncture of the common femoral artery at the level of the femoral head. The groin was re-prepped and draped and closure was performed with 6 [place] Angio-Seal device. Two additional minutes of manual compression applied at the access site for hemostasis. IMPRESSION: Gastroduodenal artery embolization performed for treatment of acute severe upper GI bleed. Electronically Signed   By: Acquanetta Belling M.D.   On: 04/23/2023 11:45   IR Angiogram Selective Each Additional Vessel  Result Date: 04/23/2023 INDICATION: 72 year old woman with acute upper GI bleed resulting in hemodynamic instability. Interventional radiology consulted angiogram and gastroduodenal artery embolization. EXAM: 1. Ultrasound-guided access of right common femoral artery. 2. Celiac arteriogram. 3. Common hepatic arteriogram. 4. Gastro duodenal arteriogram and coil embolization. 5. Angio-Seal closure of right common femoral artery. MEDICATIONS: None ANESTHESIA/SEDATION: None CONTRAST:  60 mL Omnipaque 300  intra-arterial FLUOROSCOPY: Radiation Exposure Index (as provided by the fluoroscopic device): 1013 mGy Kerma COMPLICATIONS: None immediate. PROCEDURE:  Informed consent was obtained from the patient's husband Jonny Ruiz following explanation of the procedure, risks, benefits and alternatives. The patient understands, agrees and consents for the procedure. All questions were addressed. A time out was performed prior to the initiation of the procedure. Maximal barrier sterile technique utilized including caps, mask, sterile gowns, sterile gloves, large sterile drape, hand hygiene, and chlorhexidine prep. Ultrasound image documenting patency of the right common femoral artery was obtained and placed in permanent medical record. Sterile ultrasound probe cover and gel utilized throughout the procedure. Utilizing continuous ultrasound guidance, the right common femoral artery was accessed at the level of the femoral head with a 21 gauge needle. 21 gauge needle exchanged for a transitional dilator set over 0.018 inch guidewire. Transitional dilator set exchanged for 5 French sheath over 0.035 inch guidewire. Celiac artery selected with Sos Omni catheter. Celiac angiogram showed patent splenic, left gastric, and common hepatic artery branches. Mild irregularity of the distal gastro duodenal artery was noted. No active extravasation was seen. The common hepatic artery was then selected with Progreat microcatheter. Angiogram demonstrated patent gastro duodenal and proper hepatic arteries. Gastro duodenal artery was selected with Progreat microcatheter. Angiogram was performed, however no active extravasation was identified. Coil embolization of the gastro duodenal artery was performed with a combination of 3 and 4 mm Ruby coils. Post celiac angiogram showed significantly decreased GDA flow with no active extravasation. Sheath angiogram showed appropriate puncture of the common femoral artery at the level of the femoral head. The  groin was re-prepped and draped and closure was performed with 6 [place] Angio-Seal device. Two additional minutes of manual compression applied at the access site for hemostasis. IMPRESSION: Gastroduodenal artery embolization performed for treatment of acute severe upper GI bleed. Electronically Signed   By: Acquanetta Belling M.D.   On: 04/23/2023 11:45   DG CHEST PORT 1 VIEW  Result Date: 04/23/2023 CLINICAL DATA:  72 year old female status post intubation. EXAM: PORTABLE CHEST 1 VIEW COMPARISON:  Chest x-ray 04/21/2023. FINDINGS: An endotracheal tube is in place with tip 2.9 cm above the carina. There is a right upper extremity PICC with tip terminating in the superior cavoatrial junction. Lung volumes are low. No consolidative airspace disease. No pleural effusions. No evidence of pulmonary edema. Heart size is normal. Upper mediastinal contours are within normal limits. Atherosclerotic calcifications are noted in the thoracic aorta. IMPRESSION: 1. Support apparatus, as above. 2. Low lung volumes without radiographic evidence of acute cardiopulmonary disease. 3. Aortic atherosclerosis. Electronically Signed   By: Trudie Reed M.D.   On: 04/23/2023 07:38   CT ANGIO GI BLEED  Addendum Date: 04/23/2023   ADDENDUM REPORT: 04/23/2023 00:39 ADDENDUM: These results were called by telephone at the time of interpretation on 04/22/2023 at 11:30 pm to provider Cloyd Stagers, MD, who verbally acknowledged these results. Electronically Signed   By: Helyn Numbers M.D.   On: 04/23/2023 00:39   Result Date: 04/23/2023 CLINICAL DATA:  Gastrointestinal hemorrhage, melena EXAM: CTA ABDOMEN AND PELVIS WITHOUT AND WITH CONTRAST TECHNIQUE: Multidetector CT imaging of the abdomen and pelvis was performed using the standard protocol during bolus administration of intravenous contrast. Multiplanar reconstructed images and MIPs were obtained and reviewed to evaluate the vascular anatomy. RADIATION DOSE REDUCTION: This exam was performed  according to the departmental dose-optimization program which includes automated exposure control, adjustment of the mA and/or kV according to patient size and/or use of iterative reconstruction technique. CONTRAST:  80mL OMNIPAQUE IOHEXOL 350 MG/ML SOLN COMPARISON:  05/24/2014 FINDINGS: VASCULAR Aorta:  Extensive atherosclerotic calcification. No aneurysm or dissection. No periaortic inflammatory change or hematoma. Celiac: Less than 50% stenosis of the origin. Distally patent without aneurysm or dissection. Active extravasation noted from the mid gastroduodenal artery best seen on image # 66, series # 5. SMA: Less than 50% stenosis of the origin.  Distally patent. Renals: Both renal arteries are patent without evidence of aneurysm, dissection, vasculitis, fibromuscular dysplasia or significant stenosis. IMA: Patent without evidence of aneurysm, dissection, vasculitis or significant stenosis. Inflow: Patent without evidence of aneurysm, dissection, vasculitis or significant stenosis. Proximal Outflow: Bilateral common femoral and visualized portions of the superficial and profunda femoral arteries are patent without evidence of aneurysm, dissection, vasculitis or significant stenosis. Veins: Unremarkable. Review of the MIP images confirms the above findings. NON-VASCULAR Lower chest: Visualized lung bases are clear. Moderate right coronary artery calcification. Global cardiac size within normal limits. Small hiatal hernia. Hepatobiliary: Choose 2 Pancreas: Unremarkable Spleen: Unremarkable Adrenals/Urinary Tract: The adrenal glands are unremarkable. Vascular calcification within the left renal hilum. The kidneys are otherwise unremarkable. Foley catheter balloon seen within a decompressed bladder lumen. Stomach/Bowel: There is large volume active hemorrhage involving the second portion of the duodenum with, as noted above, active extravasation arising from the mid gastroduodenal artery. Severe sigmoid diverticulosis  without superimposed acute inflammatory change. The stomach, small bowel, and large bowel are otherwise unremarkable. No free intraperitoneal gas or fluid. Lymphatic: No pathologic adenopathy within the abdomen and pelvis. Reproductive: Uterus and bilateral adnexa are unremarkable. Other: No abdominal wall hernia. Musculoskeletal: Degenerative changes are seen within the a visualized thoracolumbar spine with grade 1 anterolisthesis L4-5. No acute bone abnormality. No lytic or blastic bone lesion. IMPRESSION: 1. Large volume active hemorrhage involving the second portion of the duodenum with active extravasation arising from the mid gastroduodenal artery. 2. Moderate right coronary artery calcification. 3. Severe sigmoid diverticulosis without superimposed acute inflammatory change. Aortic Atherosclerosis (ICD10-I70.0). Electronically Signed: By: Helyn Numbers M.D. On: 04/22/2023 23:02   [place] EKG SITE RITE  Result Date: 04/22/2023 If Site Rite image not attached, placement could not be confirmed due to current cardiac rhythm.  US RENAL  Result Date: 04/22/2023 CLINICAL DATA:  AKI EXAM: RENAL / URINARY TRACT ULTRASOUND COMPLETE COMPARISON:  CT abdomen and pelvis with and without contrast July second 2015 FINDINGS: Right Kidney: Renal measurements: 9.4 x 3.9 x 4.2 cm = volume: 80.8 mL. Echogenicity within normal limits. No mass or hydronephrosis visualized. Left Kidney: Renal measurements: 8.9 x 4.4 x 5.8 cm = volume: 117.9 mL. Echogenicity within normal limits. No mass or hydronephrosis visualized. Bladder: Decompressed with Foley catheter in place. Other: None. IMPRESSION: No acute findings. Electronically Signed   By: Jacob Moores M.D.   On: 04/22/2023 11:56   DG Knee Complete 4 Views Left  Result Date: 04/21/2023 CLINICAL DATA:  Fall EXAM: LEFT KNEE - COMPLETE 4+ VIEW COMPARISON:  None Available. FINDINGS: Knee replacement. No fracture or malalignment. No significant effusion IMPRESSION: Knee  replacement. No acute osseous abnormality. Electronically Signed   By: Jasmine Pang M.D.   On: 04/21/2023 20:14   DG Knee Complete 4 Views Right  Result Date: 04/21/2023 CLINICAL DATA:  Fall EXAM: RIGHT KNEE - COMPLETE 4+ VIEW COMPARISON:  None Available. FINDINGS: No fracture or malalignment. No significant effusion. Knee replacement with intact hardware. Vascular calcifications. IMPRESSION: Knee replacement. No acute osseous abnormality. Electronically Signed   By: Jasmine Pang M.D.   On: 04/21/2023 20:13   DG Pelvis Portable  Result Date: 04/21/2023 CLINICAL DATA:  Fall EXAM: PORTABLE PELVIS 1-2 VIEWS COMPARISON:  None Available. FINDINGS: There is no evidence of pelvic fracture or diastasis. No pelvic bone lesions are seen. Possible old right inferior pubic ramus 4 milli. IMPRESSION: Negative. Electronically Signed   By: Jasmine Pang M.D.   On: 04/21/2023 20:12   DG Chest Port 1 View  Result Date: 04/21/2023 CLINICAL DATA:  Fall EXAM: PORTABLE CHEST 1 VIEW COMPARISON:  06/17/2018 FINDINGS: The heart size and mediastinal contours are within normal limits. Aortic atherosclerosis. Both lungs are clear. The visualized skeletal structures are unremarkable. IMPRESSION: No active disease. Electronically Signed   By: Jasmine Pang M.D.   On: 04/21/2023 20:11   CT Head Wo Contrast  Result Date: 04/21/2023 CLINICAL DATA:  Fall several days ago with facial pain and headaches, initial encounter EXAM: CT HEAD WITHOUT CONTRAST CT MAXILLOFACIAL WITHOUT CONTRAST CT CERVICAL SPINE WITHOUT CONTRAST TECHNIQUE: Multidetector CT imaging of the head, cervical spine, and maxillofacial structures were performed using the standard protocol without intravenous contrast. Multiplanar CT image reconstructions of the cervical spine and maxillofacial structures were also generated. RADIATION DOSE REDUCTION: This exam was performed according to the departmental dose-optimization program which includes automated exposure  control, adjustment of the mA and/or kV according to patient size and/or use of iterative reconstruction technique. COMPARISON:  None Available. FINDINGS: CT HEAD FINDINGS Brain: No evidence of acute infarction, hemorrhage, hydrocephalus, extra-axial collection or mass lesion/mass effect. Vascular: No hyperdense vessel or unexpected calcification. Skull: Normal. Negative for fracture or focal lesion. Other: None. CT MAXILLOFACIAL FINDINGS Osseous: No fracture or mandibular dislocation. No destructive process. Orbits: Orbits and their contents are within normal limits. Sinuses: Paranasal sinuses are unremarkable. Ostiomeatal complexes are patent. Soft tissues: Surrounding soft tissue structures show no focal abnormality. CT CERVICAL SPINE FINDINGS Alignment: Within normal limits. Skull base and vertebrae: 7 cervical segments are well visualized. Vertebral body height is well maintained. Multilevel disc space narrowing is noted from C3-C7 with osteophytic changes. Facet hypertrophic changes are noted as well. Neural foraminal narrowing is noted at multiple levels. No acute fracture or acute facet abnormality is noted. The odontoid is within normal limits. Soft tissues and spinal canal: Surrounding soft tissue structures are within normal limits. Upper chest: Visualized lung apices show emphysematous change. Other: None IMPRESSION: CT of the head: No acute intracranial abnormality noted. CT of the maxillofacial bones: No acute bony abnormality is noted. CT of the cervical spine: Multilevel degenerative change without acute abnormality. Electronically Signed   By: Alcide Clever M.D.   On: 04/21/2023 20:00   CT Maxillofacial WO CM  Result Date: 04/21/2023 CLINICAL DATA:  Fall several days ago with facial pain and headaches, initial encounter EXAM: CT HEAD WITHOUT CONTRAST CT MAXILLOFACIAL WITHOUT CONTRAST CT CERVICAL SPINE WITHOUT CONTRAST TECHNIQUE: Multidetector CT imaging of the head, cervical spine, and  maxillofacial structures were performed using the standard protocol without intravenous contrast. Multiplanar CT image reconstructions of the cervical spine and maxillofacial structures were also generated. RADIATION DOSE REDUCTION: This exam was performed according to the departmental dose-optimization program which includes automated exposure control, adjustment of the mA and/or kV according to patient size and/or use of iterative reconstruction technique. COMPARISON:  None Available. FINDINGS: CT HEAD FINDINGS Brain: No evidence of acute infarction, hemorrhage, hydrocephalus, extra-axial collection or mass lesion/mass effect. Vascular: No hyperdense vessel or unexpected calcification. Skull: Normal. Negative for fracture or focal lesion. Other: None. CT MAXILLOFACIAL FINDINGS Osseous: No fracture or mandibular dislocation. No destructive process. Orbits: Orbits and their contents are  within normal limits. Sinuses: Paranasal sinuses are unremarkable. Ostiomeatal complexes are patent. Soft tissues: Surrounding soft tissue structures show no focal abnormality. CT CERVICAL SPINE FINDINGS Alignment: Within normal limits. Skull base and vertebrae: 7 cervical segments are well visualized. Vertebral body height is well maintained. Multilevel disc space narrowing is noted from C3-C7 with osteophytic changes. Facet hypertrophic changes are noted as well. Neural foraminal narrowing is noted at multiple levels. No acute fracture or acute facet abnormality is noted. The odontoid is within normal limits. Soft tissues and spinal canal: Surrounding soft tissue structures are within normal limits. Upper chest: Visualized lung apices show emphysematous change. Other: None IMPRESSION: CT of the head: No acute intracranial abnormality noted. CT of the maxillofacial bones: No acute bony abnormality is noted. CT of the cervical spine: Multilevel degenerative change without acute abnormality. Electronically Signed   By: Alcide Clever  M.D.   On: 04/21/2023 20:00   CT Cervical Spine Wo Contrast  Result Date: 04/21/2023 CLINICAL DATA:  Fall several days ago with facial pain and headaches, initial encounter EXAM: CT HEAD WITHOUT CONTRAST CT MAXILLOFACIAL WITHOUT CONTRAST CT CERVICAL SPINE WITHOUT CONTRAST TECHNIQUE: Multidetector CT imaging of the head, cervical spine, and maxillofacial structures were performed using the standard protocol without intravenous contrast. Multiplanar CT image reconstructions of the cervical spine and maxillofacial structures were also generated. RADIATION DOSE REDUCTION: This exam was performed according to the departmental dose-optimization program which includes automated exposure control, adjustment of the mA and/or kV according to patient size and/or use of iterative reconstruction technique. COMPARISON:  None Available. FINDINGS: CT HEAD FINDINGS Brain: No evidence of acute infarction, hemorrhage, hydrocephalus, extra-axial collection or mass lesion/mass effect. Vascular: No hyperdense vessel or unexpected calcification. Skull: Normal. Negative for fracture or focal lesion. Other: None. CT MAXILLOFACIAL FINDINGS Osseous: No fracture or mandibular dislocation. No destructive process. Orbits: Orbits and their contents are within normal limits. Sinuses: Paranasal sinuses are unremarkable. Ostiomeatal complexes are patent. Soft tissues: Surrounding soft tissue structures show no focal abnormality. CT CERVICAL SPINE FINDINGS Alignment: Within normal limits. Skull base and vertebrae: 7 cervical segments are well visualized. Vertebral body height is well maintained. Multilevel disc space narrowing is noted from C3-C7 with osteophytic changes. Facet hypertrophic changes are noted as well. Neural foraminal narrowing is noted at multiple levels. No acute fracture or acute facet abnormality is noted. The odontoid is within normal limits. Soft tissues and spinal canal: Surrounding soft tissue structures are within normal  limits. Upper chest: Visualized lung apices show emphysematous change. Other: None IMPRESSION: CT of the head: No acute intracranial abnormality noted. CT of the maxillofacial bones: No acute bony abnormality is noted. CT of the cervical spine: Multilevel degenerative change without acute abnormality. Electronically Signed   By: Alcide Clever M.D.   On: 04/21/2023 20:00     Subjective: No acute issue/events overnight   Discharge Exam: Vitals:   05/05/23 0549 05/05/23 0912  BP: (!) 122/58   Pulse: 96   Resp: 18   Temp: 98.1 F (36.7 C)   SpO2: 94% 90%   Vitals:   05/04/23 2100 05/05/23 0136 05/05/23 0549 05/05/23 0912  BP: (!) 125/58  (!) 122/58   Pulse: 85  96   Resp: 18  18   Temp: 97.8 F (36.6 C)  98.1 F (36.7 C)   TempSrc: Oral  Oral   SpO2: 92% 94% 94% 90%  Weight:      Height:        General: Pt is alert, awake,  not in acute distress Cardiovascular: RRR, S1/S2 +, no rubs, no gallops Respiratory: CTA bilaterally, no wheezing, no rhonchi Abdominal: Soft, NT, ND, bowel sounds + Extremities: no edema, no cyanosis  The results of significant diagnostics from this hospitalization (including imaging, microbiology, ancillary and laboratory) are listed below for reference.     Microbiology: No results found for this or any previous visit (from the past 240 hour(s)).   Labs: BNP (last 3 results) No results for input(s): "BNP" in the last 8760 hours. Basic Metabolic Panel: Recent Labs  Lab 04/29/23 0408 04/30/23 0540 05/01/23 0514 05/03/23 0515 05/04/23 0327  NA 137 138 137 133* 133*  K 3.5 3.8 3.3* 4.1 4.1  CL 101 101 93* 95* 95*  CO2 30 32 34* 30 29  GLUCOSE 114* 111* 76 83 85  BUN 7* <5* <5* 8 9  CREATININE 0.70 0.63 0.78 0.82 0.91  CALCIUM 7.5* 7.8* 7.5* 8.1* 8.6*  MG 2.1 2.0 1.8 2.0 2.0   Liver Function Tests: No results for input(s): "AST", "ALT", "ALKPHOS", "BILITOT", "PROT", "ALBUMIN" in the last 168 hours. No results for input(s): "LIPASE",  "AMYLASE" in the last 168 hours. No results for input(s): "AMMONIA" in the last 168 hours. CBC: Recent Labs  Lab 04/29/23 0408 04/30/23 0540 05/01/23 0514 05/03/23 0515 05/04/23 0327  WBC 10.4 8.3 7.9 8.5 9.1  HGB 8.5* 8.4* 8.2* 8.8* 9.8*  HCT 27.6* 27.3* 26.4* 28.6* 31.1*  MCV 101.1* 101.1* 100.0 99.3 98.1  PLT 152 161 171 196 232   Cardiac Enzymes: No results for input(s): "CKTOTAL", "CKMB", "CKMBINDEX", "TROPONINI" in the last 168 hours. BNP: Invalid input(s): "POCBNP" CBG: Recent Labs  Lab 05/03/23 0721 05/03/23 1205 05/03/23 1551 05/03/23 2014 05/04/23 0039  GLUCAP 76 99 102* 101* 78   D-Dimer No results for input(s): "DDIMER" in the last 72 hours. Hgb A1c No results for input(s): "HGBA1C" in the last 72 hours. Lipid Profile No results for input(s): "CHOL", "HDL", "LDLCALC", "TRIG", "CHOLHDL", "LDLDIRECT" in the last 72 hours. Thyroid function studies No results for input(s): "TSH", "T4TOTAL", "T3FREE", "THYROIDAB" in the last 72 hours.  Invalid input(s): "FREET3" Anemia work up No results for input(s): "VITAMINB12", "FOLATE", "FERRITIN", "TIBC", "IRON", "RETICCTPCT" in the last 72 hours. Urinalysis    Component Value Date/Time   COLORURINE COLORLESS (A) 04/26/2023 1535   APPEARANCEUR CLEAR 04/26/2023 1535   LABSPEC 1.006 04/26/2023 1535   PHURINE 5.0 04/26/2023 1535   GLUCOSEU NEGATIVE 04/26/2023 1535   HGBUR SMALL (A) 04/26/2023 1535   BILIRUBINUR NEGATIVE 04/26/2023 1535   BILIRUBINUR negative 03/14/2018 1219   BILIRUBINUR neg 02/11/2015 0856   KETONESUR NEGATIVE 04/26/2023 1535   PROTEINUR NEGATIVE 04/26/2023 1535   UROBILINOGEN 0.2 03/14/2018 1219   NITRITE NEGATIVE 04/26/2023 1535   LEUKOCYTESUR NEGATIVE 04/26/2023 1535   Sepsis Labs Recent Labs  Lab 04/30/23 0540 05/01/23 0514 05/03/23 0515 05/04/23 0327  WBC 8.3 7.9 8.5 9.1   Microbiology No results found for this or any previous visit (from the past 240 hour(s)).   Time  coordinating discharge: Over 30 minutes  SIGNED:   Azucena Fallen, DO [facility] 05/05/2023, 9:56 AM Pager   If 7PM-7AM, please contact night-coverage [email or web address]

## 2023-05-06 ENCOUNTER — Telehealth: Payer: Self-pay

## 2023-05-06 ENCOUNTER — Other Ambulatory Visit: Payer: Self-pay

## 2023-05-06 DIAGNOSIS — Z8719 Personal history of other diseases of the digestive system: Secondary | ICD-10-CM

## 2023-05-06 DIAGNOSIS — K269 Duodenal ulcer, unspecified as acute or chronic, without hemorrhage or perforation: Secondary | ICD-10-CM

## 2023-05-06 NOTE — Telephone Encounter (Signed)
Spoke with patient's husband regarding path letter recommendations. EGD scheduled for 07/30/23 at 11:30 am with Dr. Chales Abrahams in the [facility]. No blood thinners or diabetic. Amb ref placed. Instruction sent to mychart. Reminded husband of hospital f/u appointment in August & advised him of when/where to go for appointments. He verbalized all understanding.

## 2023-06-29 ENCOUNTER — Encounter: Payer: Self-pay | Admitting: Physician Assistant

## 2023-06-29 ENCOUNTER — Ambulatory Visit (INDEPENDENT_AMBULATORY_CARE_PROVIDER_SITE_OTHER): Payer: Medicare Other | Admitting: Physician Assistant

## 2023-06-29 VITALS — BP 140/78 | HR 85 | Ht 61.0 in | Wt 210.8 lb

## 2023-06-29 DIAGNOSIS — D62 Acute posthemorrhagic anemia: Secondary | ICD-10-CM

## 2023-06-29 DIAGNOSIS — Z8719 Personal history of other diseases of the digestive system: Secondary | ICD-10-CM

## 2023-06-29 DIAGNOSIS — K269 Duodenal ulcer, unspecified as acute or chronic, without hemorrhage or perforation: Secondary | ICD-10-CM | POA: Diagnosis not present

## 2023-06-29 MED ORDER — PANTOPRAZOLE SODIUM 40 MG PO TBEC: 40.0000 mg | DELAYED_RELEASE_TABLET | Freq: Two times a day (BID) | ORAL | 6 refills | Status: DC

## 2023-06-29 NOTE — Progress Notes (Signed)
Subjective:    Patient ID: Nicole Peters, female    DOB: [birth year], 72 y.o.   MRN: [id number]  HPI Nicole Peters is a pleasant 72 year old white female, previously established with Dr. Lavon Paganini  and only seen for prior colonoscopy in 2019. She comes in today for post hospital follow-up after hospitalization on 04/21/2023 when she presented with altered mental status/lethargy, was found to have hemoglobin of 4 and lactate of almost 9 and associated acute kidney injury.  She had reported some black stools which occurred several days prior to that hospitalization. Then the following day on 04/22/2023 she developed melena followed by hemorrhagic shock and required multiple blood products, cryoprecipitate Kcentra and DDAVP for control of acute hemorrhage.  She underwent CTA with finding of brisk hemorrhage of the GDA.  She went on to subsequent embolization on 04/23/2023 with good control of bleeding.  She was felt to have had a massive bleed secondary to an NSAID induced ulcer.  Because of some low-grade drifting of her hemoglobin she did undergo EGD on 04/28/2023 while still hospitalized per Dr. Chales Abrahams was found to have a grade a esophagitis, nonobstructive Schatzki's ring, 2 cm hiatal hernia and 1 large deep 2 cm duodenal ulcer with coil in the first portion of the duodenum. He was discharged home on twice daily Protonix, with instructions to avoid NSAIDs forever. On 05/05/2023 hemoglobin 9.8/hematocrit 36.1/MCV of 98.1.  she has seen her PCP Dr. Nadyne Coombes since that time and had labs done on 06/15/2023 with hemoglobin 10.6/hematocrit 34.3/MCV of 90.   She says she does not remember much of the beginning of that hospitalization, and relates that she never had any pain from the ulcer.  Since discharge from the hospital she feels like she has gradually been regaining her energy, again no reports of abdominal discomfort no nausea, has been eating without difficulty, no melena or hematochezia and monitoring her  stools carefully.  Other medical problems include osteoarthritis, morbid obesity, hypertension, no sleep apnea or oxygen use.  She is already scheduled for EGD follow-up with Dr. Chales Abrahams on September 6.  Last colonoscopy March 2019 for screening with multiple large and small mouth diverticuli in the sigmoid and descending colon, nonbleeding internal hemorrhoids, no polyps.   Review of Systems. Pertinent positive and negative review of systems were noted in the above HPI section.  All other review of systems was otherwise negative.   Outpatient Encounter Medications as of 06/29/2023  Medication Sig   alendronate (FOSAMAX) 70 MG tablet Take 1 tablet (70 mg total) by mouth every 7 (seven) days. Take with a full glass of water on an empty stomach.   ALPRAZolam (XANAX) 0.25 MG tablet Take 1 tablet (0.25 mg total) by mouth at bedtime as needed for anxiety.   amLODipine (NORVASC) 5 MG tablet Take 5 mg by mouth daily.   cetirizine (ZYRTEC) 10 MG tablet Take 10 mg by mouth daily as needed for allergies.    Cholecalciferol (VITAMIN D3) 2000 units capsule Take 2,000 Units by mouth daily.   levothyroxine (SYNTHROID) 50 MCG tablet Take 75 mcg by mouth every morning.   montelukast (SINGULAIR) 10 MG tablet Take 10 mg by mouth at bedtime.   Multiple Vitamin (MULTIVITAMIN) tablet Take 1 tablet by mouth daily.   simvastatin (ZOCOR) 80 MG tablet Take 80 mg by mouth daily.   pantoprazole (PROTONIX) 40 MG tablet Take 1 tablet (40 mg total) by mouth 2 (two) times daily.   [DISCONTINUED] feeding supplement (ENSURE ENLIVE / ENSURE PLUS) LIQD  Take 237 mLs by mouth 2 (two) times daily between meals.   [DISCONTINUED] pantoprazole (PROTONIX) 40 MG tablet Take 1 tablet (40 mg total) by mouth 2 (two) times daily.   No facility-administered encounter medications on file as of 06/29/2023.   No Known Allergies Patient Active Problem List   Diagnosis Date Noted   Shock (HCC) 04/23/2023   Hemorrhagic shock (HCC) 04/23/2023    Gastrointestinal hemorrhage 04/23/2023   Endotracheally intubated 04/23/2023   Thrombocytopenia (HCC) 04/23/2023   AKI (acute kidney injury) (HCC) 04/23/2023   Encephalopathy acute 04/22/2023   Anemia 04/22/2023   Leukocytosis 04/22/2023   Syncope 04/22/2023   Hypotension due to hypovolemia 04/22/2023   ABLA (acute blood loss anemia) 04/21/2023   Primary osteoarthritis of left knee 06/24/2018   Age-related osteoporosis without current pathological fracture 10/22/2016   Pure hypercholesterolemia 08/12/2015   Primary osteoarthritis of both knees 08/12/2015   Hematuria 08/13/2014   DJD (degenerative joint disease) 10/18/2012   Depression with anxiety 10/18/2012   Obesity (BMI 30.0-34.9) 10/14/2012   HTN (hypertension) 06/10/2012   Social History   Socioeconomic History   Marital status: Married    Spouse name: Nicole Peters   Number of children: 1   Years of education: Not on file   Highest education level: Not on file  Occupational History   Occupation: retired    Comment: cleaned house  Tobacco Use   Smoking status: Former    Current packs/day: 0.00    Average packs/day: 0.3 packs/day for 40.0 years (10.0 ttl pk-yrs)    Types: Cigarettes    Start date: 12/12/1976    Quit date: 12/12/2016    Years since quitting: 6.5   Smokeless tobacco: Never   Tobacco comments:    quit smoking 2018  Vaping Use   Vaping status: Never Used  Substance and Sexual Activity   Alcohol use: No    Alcohol/week: 0.0 standard drinks of alcohol   Drug use: Never   Sexual activity: Yes    Birth control/protection: Post-menopausal  Other Topics Concern   Not on file  Social History Narrative   Marital status: married x 33 years; second husband ;happily; no abuse.      Children: one child/son in [place] (46) in [place]; one grandchild (12yo)      Lives: with husband.      Employment:  Homemaker/retired.  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      Tobacco: quit in 11/2016;  5 cigarettes per day.   Smoking since age 45.       Alcohol:  None      Drugs: none      Exercise: no exercise due to R oasteoarthritis knee.       Seatbelt:  100%.       Guns:  Loaded; secured.      ADLs: indepdendent with ADLs.     Advanced Directives: none; desires FULL CODE but no prolonged measures.   Social Determinants of Health   Financial Resource Strain: Not on file  Food Insecurity: Not on file  Transportation Needs: Not on file  Physical Activity: Not on file  Stress: Not on file  Social Connections: Not on file  Intimate Partner Violence: Not on file    Nicole Peters's family history includes Cancer in her father; Heart disease in her father; Heart disease (age of onset: 45) in her brother; Stomach cancer in her father.      Objective:    Vitals:   06/29/23 1456 06/29/23 1557  BP: Marland Kitchen)  156/78 (!) 140/78  Pulse: 85     Physical Exam Well-developed well-nourished older white female in no acute distress.  Pleasant height, Weight, 210 BMI 39.8 ambulates with a cane  HEENT; nontraumatic normocephalic, EOMI, PE R LA, sclera anicteric. Oropharynx; not examined today Neck; supple, no JVD Cardiovascular; regular rate and rhythm with S1-S2, no murmur rub or gallop Pulmonary; Clear bilaterally Abdomen; soft, obese, nontender, nondistended, no palpable mass or hepatosplenomegaly, bowel sounds are active Rectal; not done today Skin; benign exam, no jaundice rash or appreciable lesions Extremities; no clubbing cyanosis or edema skin warm and dry Neuro/Psych; alert and oriented x4, grossly nonfocal mood and affect appropriate        Assessment & Plan:   #7 72 year old white female here for post hospital follow-up after massive upper GI bleed 04/21/2020 associated with hemorrhagic shock and requiring multiple blood products, DDAVP and Kcentra.  Found to have a brisk hemorrhage of the GDA on CT angio and this was followed by embolization of the GDA per IR. EGD was done on 04/28/2023 while still  hospitalized with finding of grade a esophagitis, small hiatal hernia and 1 large deep 2 cm duodenal ulcer with coil in the first portion of the duodenum, no active bleeding.  She has been on twice daily Protonix since then, doing well with no complaints today. Ulcer felt to be NSAID induced, has been completely avoiding NSAIDs and plans never to take any again.  #2 anemia normocytic secondary to above last hemoglobin 10.6 on 06/15/2023 #3 hypertension 4.  Osteoarthritis 5.  Morbid obesity #6 colon cancer screening-up-to-date with last colonoscopy 2019 with multiple diverticuli in the sigmoid and descending colon, no polyps small internal hemorrhoids.  Due for follow-up 2029  Plan; continue Protonix 40 mg p.o. twice daily AC, refills sent Will keep her appointment for repeat EGD with Dr. Chales Abrahams 07/30/2023. Will place order for follow-up CBC to be done on that day as well.  If ulcer has completely healed at the time of EGD and she will be able to decrease Protonix to 40 mg p.o. daily and would plan to continue this long-term. Patient may wish to continue follow-up with Dr. Chales Abrahams, would be indicated for follow-up colonoscopy 2029.   Rylei Codispoti Oswald Hillock PA-C 06/29/2023   Cc: Lezlie Lye, Meda Coffee, *

## 2023-06-29 NOTE — Patient Instructions (Signed)
_______________________________________________________  If your blood pressure at your visit was 140/90 or greater, please contact your primary care physician to follow up on this.  _______________________________________________________  If you are age 72 or older, your body mass index should be between 23-30. Your Body mass index is 39.83 kg/m. If this is out of the aforementioned range listed, please consider follow up with your Primary Care Provider.  If you are age 62 or younger, your body mass index should be between 19-25. Your Body mass index is 39.83 kg/m. If this is out of the aformentioned range listed, please consider follow up with your Primary Care Provider.   ________________________________________________________  The [facility] providers would like to encourage you to use [facility] to communicate with providers for non-urgent requests or questions.  Due to long hold times on the telephone, sending your provider a message by [facility] may be a faster and more efficient way to get a response.  Please allow 48 business hours for a response.  Please remember that this is for non-urgent requests.  _______________________________________________________  Your provider has requested that you go to the basement level for lab work on 07/30/23 prior to endoscopy.. Press "B" on the elevator. The lab is located at the first door on the left as you exit the elevator.   You have been scheduled for an endoscopy. Please follow written instructions given to you at your visit today.  If you use inhalers (even only as needed), please bring them with you on the day of your procedure.  If you take any of the following medications, they will need to be adjusted prior to your procedure:   DO NOT TAKE 7 DAYS PRIOR TO TEST- Trulicity (dulaglutide) Ozempic, Wegovy (semaglutide) Mounjaro (tirzepatide) Bydureon Bcise (exanatide extended release)  DO NOT TAKE 1 DAY PRIOR TO YOUR TEST Rybelsus  (semaglutide) Adlyxin (lixisenatide) Victoza (liraglutide) Byetta (exanatide) ___________________________________________________________________________    Due to recent changes in healthcare laws, you may see the results of your imaging and laboratory studies on MyChart before your provider has had a chance to review them.  We understand that in some cases there may be results that are confusing or concerning to you. Not all laboratory results come back in the same time frame and the provider may be waiting for multiple results in order to interpret others.  Please give [place] 48 hours in order for your provider to thoroughly review all the results before contacting the office for clarification of your results.   Thank you for entrusting me with your care and choosing [facility].

## 2023-07-26 ENCOUNTER — Encounter: Payer: Self-pay | Admitting: Certified Registered Nurse Anesthetist

## 2023-07-30 ENCOUNTER — Encounter: Payer: Self-pay | Admitting: Gastroenterology

## 2023-07-30 ENCOUNTER — Emergency Department (HOSPITAL_COMMUNITY): Payer: Medicare Other

## 2023-07-30 ENCOUNTER — Other Ambulatory Visit: Payer: Self-pay

## 2023-07-30 ENCOUNTER — Ambulatory Visit (AMBULATORY_SURGERY_CENTER): Payer: Medicare Other | Admitting: Gastroenterology

## 2023-07-30 ENCOUNTER — Encounter: Payer: Medicare Other | Admitting: Gastroenterology

## 2023-07-30 ENCOUNTER — Other Ambulatory Visit (INDEPENDENT_AMBULATORY_CARE_PROVIDER_SITE_OTHER): Payer: Medicare Other

## 2023-07-30 ENCOUNTER — Encounter (HOSPITAL_COMMUNITY): Payer: Self-pay

## 2023-07-30 ENCOUNTER — Emergency Department (HOSPITAL_COMMUNITY)
Admission: EM | Admit: 2023-07-30 | Discharge: 2023-07-30 | Discharge status: Against Medical Advice | Payer: Medicare Other | Attending: Emergency Medicine | Admitting: Emergency Medicine

## 2023-07-30 VITALS — BP 155/61 | HR 81 | Temp 97.3°F | Ht 61.0 in | Wt 210.0 lb

## 2023-07-30 DIAGNOSIS — Z1152 Encounter for screening for COVID-19: Secondary | ICD-10-CM | POA: Diagnosis not present

## 2023-07-30 DIAGNOSIS — Z96653 Presence of artificial knee joint, bilateral: Secondary | ICD-10-CM | POA: Diagnosis not present

## 2023-07-30 DIAGNOSIS — K269 Duodenal ulcer, unspecified as acute or chronic, without hemorrhage or perforation: Secondary | ICD-10-CM

## 2023-07-30 DIAGNOSIS — Z8719 Personal history of other diseases of the digestive system: Secondary | ICD-10-CM

## 2023-07-30 DIAGNOSIS — D62 Acute posthemorrhagic anemia: Secondary | ICD-10-CM | POA: Diagnosis not present

## 2023-07-30 DIAGNOSIS — R0602 Shortness of breath: Secondary | ICD-10-CM | POA: Insufficient documentation

## 2023-07-30 DIAGNOSIS — R0902 Hypoxemia: Secondary | ICD-10-CM | POA: Insufficient documentation

## 2023-07-30 DIAGNOSIS — Z79899 Other long term (current) drug therapy: Secondary | ICD-10-CM | POA: Insufficient documentation

## 2023-07-30 DIAGNOSIS — I10 Essential (primary) hypertension: Secondary | ICD-10-CM | POA: Insufficient documentation

## 2023-07-30 HISTORY — DX: Hypoxemia: R09.02

## 2023-07-30 LAB — CBC WITH DIFFERENTIAL/PLATELET
Basophils Absolute: 0.1 10*3/uL (ref 0.0–0.1)
Basophils Relative: 0.6 % (ref 0.0–3.0)
Eosinophils Absolute: 0.2 10*3/uL (ref 0.0–0.7)
Eosinophils Relative: 2 % (ref 0.0–5.0)
HCT: 43.7 % (ref 36.0–46.0)
Hemoglobin: 13.4 g/dL (ref 12.0–15.0)
Lymphocytes Relative: 16.9 % (ref 12.0–46.0)
Lymphs Abs: 1.4 10*3/uL (ref 0.7–4.0)
MCHC: 30.7 g/dL (ref 30.0–36.0)
MCV: 83.2 fl (ref 78.0–100.0)
Monocytes Absolute: 0.9 10*3/uL (ref 0.1–1.0)
Monocytes Relative: 10.4 % (ref 3.0–12.0)
Neutro Abs: 5.8 10*3/uL (ref 1.4–7.7)
Neutrophils Relative %: 70.1 % (ref 43.0–77.0)
Platelets: 222 10*3/uL (ref 150.0–400.0)
RBC: 5.25 Mil/uL — ABNORMAL HIGH (ref 3.87–5.11)
RDW: 19.4 % — ABNORMAL HIGH (ref 11.5–15.5)
WBC: 8.2 10*3/uL (ref 4.0–10.5)

## 2023-07-30 LAB — COMPREHENSIVE METABOLIC PANEL
ALT: 15 U/L (ref 0–44)
AST: 22 U/L (ref 15–41)
Albumin: 4 g/dL (ref 3.5–5.0)
Alkaline Phosphatase: 49 U/L (ref 38–126)
Anion gap: 9 (ref 5–15)
BUN: 14 mg/dL (ref 8–23)
CO2: 27 mmol/L (ref 22–32)
Calcium: 9.2 mg/dL (ref 8.9–10.3)
Chloride: 95 mmol/L — ABNORMAL LOW (ref 98–111)
Creatinine, Ser: 0.99 mg/dL (ref 0.44–1.00)
GFR, Estimated: >60 mL/min (ref >60)
Glucose, Bld: 100 mg/dL — ABNORMAL HIGH (ref 70–99)
Potassium: 4.1 mmol/L (ref 3.5–5.1)
Sodium: 131 mmol/L — ABNORMAL LOW (ref 135–145)
Total Bilirubin: 1 mg/dL (ref 0.3–1.2)
Total Protein: 7.3 g/dL (ref 6.5–8.1)

## 2023-07-30 LAB — CBC
HCT: 42.3 % (ref 36.0–46.0)
Hemoglobin: 13 g/dL (ref 12.0–15.0)
MCH: 25.7 pg — ABNORMAL LOW (ref 26.0–34.0)
MCHC: 30.7 g/dL (ref 30.0–36.0)
MCV: 83.6 fL (ref 80.0–100.0)
Platelets: 204 10*3/uL (ref 150–400)
RBC: 5.06 MIL/uL (ref 3.87–5.11)
RDW: 18.3 % — ABNORMAL HIGH (ref 11.5–15.5)
WBC: 7.8 10*3/uL (ref 4.0–10.5)
nRBC: 0 % (ref 0.0–0.2)

## 2023-07-30 LAB — BLOOD GAS, VENOUS
Acid-Base Excess: 3.8 mmol/L — ABNORMAL HIGH (ref 0.0–2.0)
Bicarbonate: 29.2 mmol/L — ABNORMAL HIGH (ref 20.0–28.0)
O2 Saturation: 67.6 %
Patient temperature: 37
pCO2, Ven: 46 mmHg (ref 44–60)
pH, Ven: 7.41 (ref 7.25–7.43)
pO2, Ven: 37 mmHg (ref 32–45)

## 2023-07-30 LAB — RESP PANEL BY RT-PCR (RSV, FLU A&B, COVID)  RVPGX2
Influenza A by PCR: NEGATIVE
Influenza B by PCR: NEGATIVE
Resp Syncytial Virus by PCR: NEGATIVE
SARS Coronavirus 2 by RT PCR: NEGATIVE

## 2023-07-30 LAB — D-DIMER, QUANTITATIVE: D-Dimer, Quant: 0.54 ug{FEU}/mL — ABNORMAL HIGH (ref 0.00–0.50)

## 2023-07-30 MED ORDER — SODIUM CHLORIDE 0.9 % IV SOLN: 500.0000 mL | INTRAVENOUS | Status: AC

## 2023-07-30 MED ORDER — IOHEXOL 350 MG/ML SOLN
75.0000 mL | Freq: Once | INTRAVENOUS | Status: AC | PRN
  Administered 2023-07-30: 75 mL via INTRAVENOUS

## 2023-07-30 NOTE — ED Notes (Signed)
Ambulated pt with pulse o2. Pt o2 started at 99 and decreasing minimum was  79%. Pt seemed to be aggravated stating she is not staying, she is going home. Pt states she feels no chest tightness or shorten of breath.

## 2023-07-30 NOTE — ED Notes (Signed)
Tech attempted to ambulate patient per provider order but patient refused. RN explained to patient that we were ambulating her to monitor oxygen saturation, patient c/o pain to back of her legs and refused to ambulate. RN informed MD and PA. Food and fluids given to patient and husband.

## 2023-07-30 NOTE — ED Notes (Signed)
Ambulated pt from room to the end of hall 96% start and on return to room oxygen 83%

## 2023-07-30 NOTE — ED Provider Notes (Incomplete)
72 year old female history of depression, hypertension, hyperlipidemia presenting for hypoxemia.  She supposed to get a colonoscopy today but states that while she was changing for the procedure she desaturated to the 60s.  She did have some shortness of breath at the time.  She currently is asymptomatic although feel short of breath when she exerts herself.  She has not had any chest pain.  No history of DVT or PE.  Here she has significant hypoxia with ambulation as well although is not hypoxic at rest.  CT and CTA without signs of acute pathology, however given significant desaturation with ambulation I do think she needs admission for management and further workup.

## 2023-07-30 NOTE — Progress Notes (Signed)
Presented to endoscopy center, O2 sats 89-90%. After undressing for procedure, SOB noted and O2 sats 66% , increased to 89% without O2.  Patient still SOB Dr. Chales Abrahams and Tasia Catchings, CRNA notified, procedure cancelled.  Patient instructed to present to ED and she verbalized understanding. SChaplin, RN,BSN

## 2023-07-30 NOTE — Discharge Instructions (Signed)
You have chosen to leave AMA today. I recommend close follow-up with PCP for reevaluation.  Please do not hesitate to return to emergency department if worrisome signs symptoms we discussed become apparent.

## 2023-07-30 NOTE — ED Provider Notes (Signed)
[place] [facility]GENC[facility] DEPARTMENT AT [facility] Provider Note   CSN: [id number] Arrival date & time: 07/30/23  1130     History {Add pertinent medical, surgical, social history, OB history to HPI:1} Chief Complaint  Patient presents with   hypoxia    Nicole Peters is a 72 y.o. female with a past medical history of hyperlipidemia, hypertension osteoporosis presents today for evaluation of low O2 reading.  Patient was at [facility] gastroenterology to have a colonoscopy when her O2 suddenly dropped in the high 60%.  Patient stated this happened after she changes her close to the ground.  Patient was immediately transferred to [facility] for evaluation.  Patient endorses some mild shortness of breath.  She denies any fever, chest pain, nausea, vomiting, back pain, cough, runny nose, bowel change, urinary symptoms.  She denies any personal or family history of PE/DVT, recent long travel, recent surgery, leg swelling.  Patient was placed on 2 L of nasal cannula by triage nurse, O2 Sat 100%.  HPI  Past Medical History:  Diagnosis Date   Allergy    Anxiety    Arthritis    Depression    History of bronchitis    Hyperlipidemia    Hypertension    Hypoxemia 07/30/2023   O2 sats dropped in mid 60s with pt putting socks on in admitting.   Obesity    Osteoporosis    Stress incontinence    Past Surgical History:  Procedure Laterality Date   APPENDECTOMY     Arthroscopic knee surgery     Both knees- Dr. Luiz Blare   BIOPSY  04/28/2023   Procedure: BIOPSY;  Surgeon: Lynann Bologna, MD;  Location: [facility] ENDOSCOPY;  Service: Gastroenterology;;   CHOLECYSTECTOMY     COLONOSCOPY  12/22/2007   Diverticulosis. [facility]. Repeat in 10 years.   ESOPHAGOGASTRODUODENOSCOPY N/A 04/28/2023   Procedure: ESOPHAGOGASTRODUODENOSCOPY (EGD);  Surgeon: Lynann Bologna, MD;  Location: [facility];  Service: Gastroenterology;  Laterality: N/A;   INJECTION KNEE Left 03/16/2016   Procedure: LEFT KNEE  CORTISONE INJECTION. ;  Surgeon: Jodi Geralds, MD;  Location: [facility] OR;  Service: Orthopedics;  Laterality: Left;   IR ANGIOGRAM SELECTIVE EACH ADDITIONAL VESSEL  04/23/2023   IR ANGIOGRAM SELECTIVE EACH ADDITIONAL VESSEL  04/23/2023   IR ANGIOGRAM VISCERAL SELECTIVE  04/23/2023   IR EMBO TUMOR ORGAN ISCHEMIA INFARCT INC GUIDE ROADMAPPING  04/23/2023   IR US GUIDE VASC ACCESS RIGHT  04/23/2023   KNEE SURGERY Bilateral    arthroscopic   LAPAROTOMY     lysis of scar adhesion   TONSILLECTOMY     TOTAL KNEE ARTHROPLASTY Right 03/16/2016   Procedure: RIGHT TOTAL KNEE ARTHROPLASTY ( LATERAL APPROACH) & LEFT KNEE CORTISONE INJECTION. ;  Surgeon: Jodi Geralds, MD;  Location: [facility] OR;  Service: Orthopedics;  Laterality: Right;   TOTAL KNEE ARTHROPLASTY Left 06/24/2018   Procedure: LEFT TOTAL KNEE ARTHROPLASTY;  Surgeon: Jodi Geralds, MD;  Location: [facility] ORS;  Service: Orthopedics;  Laterality: Left;   TUBAL LIGATION       Home Medications Prior to Admission medications   Medication Sig Start Date End Date Taking? Authorizing Provider  alendronate (FOSAMAX) 70 MG tablet Take 1 tablet (70 mg total) by mouth every 7 (seven) days. Take with a full glass of water on an empty stomach. 09/22/18   Lezlie Lye, Meda Coffee, MD  ALPRAZolam Prudy Feeler) 0.25 MG tablet Take 1 tablet (0.25 mg total) by mouth at bedtime as needed for anxiety. 05/03/23   Maurilio Lovely D,  DO  amLODipine (NORVASC) 5 MG tablet Take 5 mg by mouth daily. 02/27/23   [provider]  cetirizine (ZYRTEC) 10 MG tablet Take 10 mg by mouth daily as needed for allergies.     [provider]  Cholecalciferol (VITAMIN D3) 2000 units capsule Take 2,000 Units by mouth daily.    [provider]  levothyroxine (SYNTHROID) 50 MCG tablet Take 75 mcg by mouth every morning. 02/03/23   [provider]  montelukast (SINGULAIR) 10 MG tablet Take 10 mg by mouth at bedtime. 02/14/23   [provider]  Multiple Vitamin (MULTIVITAMIN) tablet  Take 1 tablet by mouth daily.    [provider]  pantoprazole (PROTONIX) 40 MG tablet Take 1 tablet (40 mg total) by mouth 2 (two) times daily. 06/29/23 07/29/23  Esterwood, Amy S, PA-C  simvastatin (ZOCOR) 80 MG tablet Take 80 mg by mouth daily. 02/03/23   [provider]      Allergies    Patient has no known allergies.    Review of Systems   Review of Systems Negative except as per HPI.  Physical Exam Updated Vital Signs BP 121/71 (BP Location: Right Arm)   Pulse 90   Temp 97.6 F (36.4 C) (Oral)   Resp 16   SpO2 96%  Physical Exam  ED Results / Procedures / Treatments   Labs (all labs ordered are listed, but only abnormal results are displayed) Labs Reviewed - No data to display  EKG None  Radiology No results found.  Procedures Procedures  {Document cardiac monitor, telemetry assessment procedure when appropriate:1}  Medications Ordered in ED Medications - No data to display  ED Course/ Medical Decision Making/ A&P   {   Click here for ABCD2, HEART and other calculatorsREFRESH Note before signing :1}                              Medical Decision Making  ***  {Document critical care time when appropriate:1} {Document review of labs and clinical decision tools ie heart score, Chads2Vasc2 etc:1}  {Document your independent review of radiology images, and any outside records:1} {Document your discussion with family members, caretakers, and with consultants:1} {Document social determinants of health affecting pt's care:1} {Document your decision making why or why not admission, treatments were needed:1} Final Clinical Impression(s) / ED Diagnoses Final diagnoses:  None    Rx / DC Orders ED Discharge Orders     None

## 2023-07-30 NOTE — ED Notes (Signed)
Pt ambulated to restroom, Ox sat dropped to 80%. Pt had dyspnea on exertion.  Placed pt on 2l [place], notified  [place], [place]

## 2023-07-30 NOTE — ED Notes (Signed)
Pt ox sats at 79% in room.  Gave pt 2L via nasal cannula.  Ox sats rebounded to 100%

## 2023-07-30 NOTE — Progress Notes (Signed)
Patient taken down by w/c, O2 sats 90% on discharge

## 2023-07-30 NOTE — ED Triage Notes (Signed)
Pt arrived from procedure due to Pt ox status dropped to mid 60 Pt states while changing clothes at other facility her ox sats dropped to around 60% Denies ox use at home Denies dizziness dyspnea

## 2023-07-30 NOTE — Progress Notes (Signed)
Here for rpt EGD to ensure healing of DU  Eval by anesthesia Denyse Amass) Pt with Shortness of breath Drops her saturation on minimal exertion to mid 60% Hb 13. No GI bleed  Not a  candidate for EGD at this time would need further evaluation in ED- ?CxR vs CT chest D/W Denyse Amass  FU with APP clinic/Dr Nandigam thereafter  RG

## 2024-01-06 ENCOUNTER — Other Ambulatory Visit: Payer: Self-pay | Admitting: Physician Assistant

## 2024-01-21 ENCOUNTER — Encounter: Payer: Self-pay | Admitting: Pulmonary Disease

## 2024-01-21 ENCOUNTER — Ambulatory Visit: Payer: Medicare Other | Admitting: Pulmonary Disease

## 2024-01-21 VITALS — BP 124/76 | HR 94 | Ht 61.5 in | Wt 210.0 lb

## 2024-01-21 DIAGNOSIS — I5030 Unspecified diastolic (congestive) heart failure: Secondary | ICD-10-CM

## 2024-01-21 DIAGNOSIS — J439 Emphysema, unspecified: Secondary | ICD-10-CM

## 2024-01-21 DIAGNOSIS — R0602 Shortness of breath: Secondary | ICD-10-CM | POA: Diagnosis not present

## 2024-01-21 MED ORDER — STIOLTO RESPIMAT 2.5-2.5 MCG/ACT IN AERS: 2.0000 | INHALATION_SPRAY | Freq: Every day | RESPIRATORY_TRACT | 3 refills | Status: DC

## 2024-01-21 NOTE — Progress Notes (Signed)
 Nicole Peters    [id number]    [birth year]  Primary Care Physician:Richter, Marcos Eke, MD  Referring Physician: Dois Davenport, MD [address] [place],  [place] [address]  Chief complaint:   Shortness of breath Worsening over the last couple years  HPI:  Shortness of breath with activity  Exercise tolerance about 2 blocks Can go up about 20 Has had 2 knee replacements in the past, does not have ongoing chronic pain Does have occasional back discomfort  Past history of smoking about a pack a day, quit about 7 years ago Not aware of any lung disease No history of asthma previously Has had wheezing previously but not on a regular basis  Does have an albuterol inhaler which she uses up to 2-3 times a day for shortness of breath  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   alendronate (FOSAMAX) 70 MG tablet Take 1 tablet (70 mg total) by mouth every 7 (seven) days. Take with a full glass of water on an empty stomach.   ALPRAZolam (XANAX) 0.25 MG tablet Take 1 tablet (0.25 mg total) by mouth at bedtime as needed for anxiety.   amLODipine (NORVASC) 5 MG tablet Take 5 mg by mouth daily.   Cholecalciferol (VITAMIN D3) 2000 units capsule Take 2,000 Units by mouth daily.   levothyroxine (SYNTHROID) 50 MCG tablet Take 75 mcg by mouth every morning.   montelukast (SINGULAIR) 10 MG tablet Take 10 mg by mouth at bedtime.   Multiple Vitamin (MULTIVITAMIN) tablet Take 1 tablet by  mouth daily.   pantoprazole (PROTONIX) 40 MG tablet TAKE 1 TABLET(40 MG) BY MOUTH TWICE DAILY   simvastatin (ZOCOR) 80 MG tablet Take 80 mg by mouth daily.   Tiotropium Bromide-Olodaterol (STIOLTO RESPIMAT) 2.5-2.5 MCG/ACT AERS Inhale 2 puffs into the lungs daily.   cetirizine (ZYRTEC) 10 MG tablet Take 10 mg by mouth daily as needed for allergies.  (Patient not taking: Reported on 01/21/2024)   Facility-Administered Encounter Medications as of 01/21/2024  Medication   0.9 %  sodium chloride infusion    Allergies as of 01/21/2024   (No Known Allergies)    Past Medical History:  Diagnosis Date   Allergy    Anxiety    Arthritis    Depression    History of bronchitis    Hyperlipidemia    Hypertension    Hypoxemia 07/30/2023   O2 sats dropped in mid 60s with pt putting socks on in admitting.   Obesity    Osteoporosis    Stress incontinence     Past Surgical History:  Procedure Laterality Date   APPENDECTOMY     Arthroscopic knee surgery     Both knees- Dr. Luiz Blare   BIOPSY  04/28/2023   Procedure: BIOPSY;  Surgeon: Lynann Bologna, MD;  Location: [facility];  Service: Gastroenterology;;   CHOLECYSTECTOMY     COLONOSCOPY  12/22/2007   Diverticulosis. [facility]. Repeat in 10 years.   ESOPHAGOGASTRODUODENOSCOPY  N/A 04/28/2023   Procedure: ESOPHAGOGASTRODUODENOSCOPY (EGD);  Surgeon: Lynann Bologna, MD;  Location: [facility];  Service: Gastroenterology;  Laterality: N/A;   INJECTION KNEE Left 03/16/2016   Procedure: LEFT KNEE CORTISONE INJECTION. ;  Surgeon: Jodi Geralds, MD;  Location: [facility];  Service: Orthopedics;  Laterality: Left;   IR ANGIOGRAM SELECTIVE EACH ADDITIONAL VESSEL  04/23/2023   IR ANGIOGRAM SELECTIVE EACH ADDITIONAL VESSEL  04/23/2023   IR ANGIOGRAM VISCERAL SELECTIVE  04/23/2023   IR EMBO TUMOR ORGAN ISCHEMIA INFARCT INC GUIDE ROADMAPPING  04/23/2023   IR US GUIDE VASC ACCESS RIGHT  04/23/2023   KNEE SURGERY Bilateral    arthroscopic   LAPAROTOMY     lysis  of scar adhesion   TONSILLECTOMY     TOTAL KNEE ARTHROPLASTY Right 03/16/2016   Procedure: RIGHT TOTAL KNEE ARTHROPLASTY ( LATERAL APPROACH) & LEFT KNEE CORTISONE INJECTION. ;  Surgeon: Jodi Geralds, MD;  Location: [facility];  Service: Orthopedics;  Laterality: Right;   TOTAL KNEE ARTHROPLASTY Left 06/24/2018   Procedure: LEFT TOTAL KNEE ARTHROPLASTY;  Surgeon: Jodi Geralds, MD;  Location: [facility];  Service: Orthopedics;  Laterality: Left;   TUBAL LIGATION      Family History  Problem Relation Age of Onset   Cancer Father        stomach cancer with liver mets   Heart disease Father        angina; no AMI/CAD;no CABG   Stomach cancer Father    Heart disease Brother 50       mild heart attack/AMI in 1997   Colon cancer Neg Hx    Colon polyps Neg Hx    Esophageal cancer Neg Hx    Rectal cancer Neg Hx     Social History   Socioeconomic History   Marital status: Married    Spouse name: Trey Paula   Number of children: 1   Years of education: Not on file   Highest education level: Not on file  Occupational History   Occupation: retired    Comment: cleaned house  Tobacco Use   Smoking status: Former    Current packs/day: 0.00    Average packs/day: 0.3 packs/day for 40.0 years (10.0 ttl pk-yrs)    Types: Cigarettes    Start date: 12/12/1976    Quit date: 12/12/2016    Years since quitting: 7.1   Smokeless tobacco: Never   Tobacco comments:    quit smoking 2018  Vaping Use   Vaping status: Never Used  Substance and Sexual Activity   Alcohol use: No   Drug use: Never   Sexual activity: Yes    Birth control/protection: Post-menopausal  Other Topics Concern   Not on file  Social History Narrative   Marital status: married x 33 years; second husband ;happily; no abuse.      Children: one child/son in [place] (46) in [place]; one grandchild (12yo)      Lives: with husband.      Employment:  Homemaker/retired.  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      Tobacco: quit in 11/2016;  5  cigarettes per day.  Smoking since age 79.       Alcohol:  None      Drugs: none      Exercise: no exercise due to R oasteoarthritis knee.       Seatbelt:  100%.       Guns:  Loaded; secured.      ADLs: indepdendent with ADLs.     Advanced Directives: none; desires  FULL CODE but no prolonged measures.   Social Drivers of Corporate investment banker Strain: Not on file  Food Insecurity: Not on file  Transportation Needs: Not on file  Physical Activity: Not on file  Stress: Not on file  Social Connections: Not on file  Intimate Partner Violence: Not on file    Review of Systems  Respiratory:  Positive for shortness of breath.     Vitals:   01/21/24 0849  BP: 124/76  Pulse: 94  SpO2: 94%     Physical Exam Constitutional:      Appearance: She is obese.  HENT:     Head: Normocephalic.     Mouth/Throat:     Mouth: Mucous membranes are moist.  Eyes:     General: No scleral icterus. Cardiovascular:     Rate and Rhythm: Normal rate and regular rhythm.     Heart sounds: No murmur heard.    No friction rub.  Pulmonary:     Effort: No respiratory distress.     Breath sounds: No stridor. No wheezing or rhonchi.  Musculoskeletal:     Cervical back: No rigidity or tenderness.  Neurological:     Mental Status: She is alert.  Psychiatric:        Mood and Affect: Mood normal.    Data Reviewed: CT scan September 2024-evidence of emphysema-reviewed with the patient  Echocardiogram 07/30/2022-mild pulmonary hypertension, diastolic dysfunction  Last chest x-ray 07/30/2023-reviewed by myself with no evidence of acute disease  Inhaler technique was assessed in the office today and encouraged about how to use it properly   Assessment:   Longstanding shortness of breath -Likely multifactorial -Emphysema on CT -Need for use of albuterol up 2-3 times a day -Past history of smoking -Abnormal echocardiogram  Diastolic dysfunction -Will need repeat  echocardiogram  Emphysema -Underlying obstructive lung disease -Will need a PFT to assess severity  Some deconditioning likely playing a role -Importance of graded activities, regular exercises discussed  Plan/Recommendations: Schedule patient for pulmonary function test  Schedule for echocardiogram  Prescription for Stiolto  Encouraged to watch a few videos about how to use inhaler correctly, reinforcing using it correctly  Follow-up in about 6 to 8 weeks  Encouraged to call with significant concerns  Multifactorial reasons for shortness of breath  I spent 45 minutes dedicated to the care of this patient on the date of this encounter to include previsit review of records, face-to-face time with the patient discussing conditions above, post visit ordering of testing,ordering medications,independentlyinterpreting results, clinical documentation with electronic health record and communicated necessary findings to members of the patient's care team   Virl Diamond MD [facility] 01/21/2024, 9:31 AM  CC: Dois Davenport, MD

## 2024-01-21 NOTE — Patient Instructions (Signed)
 I will see you back in about 6 to 8 weeks  We will get a breathing study, can be done on the day that you come back in  I have scheduled him for an echocardiogram ultrasound of the heart to compare with previous  Prescription for Stiolto sent to pharmacy for you, to be used once a day You can continue using your albuterol as needed  Make sure you using the inhaler correctly  Graded exercises as tolerated  Call us with significant concerns  Your last CAT scan does show that you have evidence of emphysema, I do believe the inhaler will help your shortness of breath

## 2024-01-21 NOTE — Progress Notes (Signed)
 Patient seen in the office today and instructed on use of correctly using stiolto inhaler.  Patient expressed understanding and demonstrated technique.

## 2024-02-16 ENCOUNTER — Ambulatory Visit (HOSPITAL_COMMUNITY): Payer: Medicare Other | Attending: Cardiology

## 2024-02-16 DIAGNOSIS — R0602 Shortness of breath: Secondary | ICD-10-CM | POA: Diagnosis present

## 2024-02-16 LAB — ECHOCARDIOGRAM COMPLETE
Area-P 1/2: 4.39 cm2
S' Lateral: 2.5 cm

## 2024-03-03 ENCOUNTER — Ambulatory Visit (HOSPITAL_COMMUNITY): Admission: RE | Admit: 2024-03-03 | Source: Ambulatory Visit

## 2024-03-21 ENCOUNTER — Encounter (HOSPITAL_BASED_OUTPATIENT_CLINIC_OR_DEPARTMENT_OTHER): Payer: Medicare Other

## 2024-03-30 ENCOUNTER — Ambulatory Visit (HOSPITAL_COMMUNITY)
Admission: RE | Admit: 2024-03-30 | Discharge: 2024-03-30 | Disposition: A | Discharge status: Home / Self Care | Source: Ambulatory Visit | Attending: Vascular Surgery | Admitting: Vascular Surgery

## 2024-03-30 ENCOUNTER — Other Ambulatory Visit: Payer: Self-pay | Admitting: Family Medicine

## 2024-03-30 DIAGNOSIS — N183 Chronic kidney disease, stage 3 unspecified: Secondary | ICD-10-CM | POA: Diagnosis not present

## 2024-04-10 ENCOUNTER — Encounter: Payer: Self-pay | Admitting: Pulmonary Disease

## 2024-04-10 ENCOUNTER — Ambulatory Visit: Payer: Medicare Other | Admitting: Pulmonary Disease

## 2024-04-10 VITALS — BP 134/73 | HR 90 | Ht 61.0 in | Wt 218.0 lb

## 2024-04-10 DIAGNOSIS — I5030 Unspecified diastolic (congestive) heart failure: Secondary | ICD-10-CM | POA: Diagnosis not present

## 2024-04-10 DIAGNOSIS — R0602 Shortness of breath: Secondary | ICD-10-CM | POA: Diagnosis not present

## 2024-04-10 DIAGNOSIS — J439 Emphysema, unspecified: Secondary | ICD-10-CM

## 2024-04-10 DIAGNOSIS — Z87891 Personal history of nicotine dependence: Secondary | ICD-10-CM | POA: Diagnosis not present

## 2024-04-10 LAB — PULMONARY FUNCTION TEST
DL/VA % pred: 43 %
DL/VA: 1.83 ml/min/mmHg/L
DLCO unc % pred: 43 %
DLCO unc: 7.48 ml/min/mmHg
FEF 25-75 Post: 0.4 L/s
FEF 25-75 Pre: 0.47 L/s
FEF2575-%Change-Post: -14 %
FEF2575-%Pred-Post: 25 %
FEF2575-%Pred-Pre: 29 %
FEV1-%Change-Post: -5 %
FEV1-%Pred-Post: 56 %
FEV1-%Pred-Pre: 59 %
FEV1-Post: 1.07 L
FEV1-Pre: 1.12 L
FEV1FVC-%Change-Post: 0 %
FEV1FVC-%Pred-Pre: 75 %
FEV6-%Change-Post: -4 %
FEV6-%Pred-Post: 76 %
FEV6-%Pred-Pre: 79 %
FEV6-Post: 1.84 L
FEV6-Pre: 1.91 L
FEV6FVC-%Change-Post: 2 %
FEV6FVC-%Pred-Post: 103 %
FEV6FVC-%Pred-Pre: 101 %
FVC-%Change-Post: -6 %
FVC-%Pred-Post: 73 %
FVC-%Pred-Pre: 78 %
FVC-Post: 1.86 L
FVC-Pre: 1.99 L
Post FEV1/FVC ratio: 57 %
Post FEV6/FVC ratio: 98 %
Pre FEV1/FVC ratio: 57 %
Pre FEV6/FVC Ratio: 96 %
RV % pred: 217 %
RV: 4.56 L
TLC % pred: 148 %
TLC: 6.86 L

## 2024-04-10 NOTE — Patient Instructions (Signed)
 Full pft performed today.

## 2024-04-10 NOTE — Progress Notes (Signed)
 Full pft performed today.

## 2024-04-10 NOTE — Progress Notes (Signed)
 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   alendronate  (FOSAMAX ) 70 MG tablet Take 1 tablet (70 mg total) by mouth every 7 (seven) days. Take with a full glass of water  on an empty stomach.   ALPRAZolam  (XANAX ) 0.25 MG tablet Take 1 tablet (0.25 mg total) by mouth at bedtime as needed for anxiety.   amLODipine  (NORVASC ) 10 MG tablet Take 10 mg by mouth daily.   benazepril  (LOTENSIN ) 20 MG tablet Take 20 mg by mouth 2 (two) times daily.   Cholecalciferol (VITAMIN D3) 2000 units capsule Take 2,000 Units by mouth daily.   levothyroxine  (SYNTHROID ) 50 MCG tablet Take 75 mcg by mouth every morning.   montelukast  (SINGULAIR ) 10 MG tablet Take 10 mg by mouth at bedtime.   Multiple Vitamin (MULTIVITAMIN) tablet Take 1 tablet by mouth daily.   pantoprazole  (PROTONIX ) 40 MG tablet TAKE 1 TABLET(40 MG) BY MOUTH TWICE DAILY    simvastatin (ZOCOR) 80 MG tablet Take 80 mg by mouth daily.   Tiotropium Bromide-Olodaterol (STIOLTO RESPIMAT ) 2.5-2.5 MCG/ACT AERS Inhale 2 puffs into the lungs daily.   [DISCONTINUED] amLODipine  (NORVASC ) 5 MG tablet Take 10 mg by mouth daily.   [DISCONTINUED] cetirizine (ZYRTEC) 10 MG tablet Take 10 mg by mouth daily as needed for allergies.  (Patient not taking: Reported on 01/21/2024)   Facility-Administered Encounter Medications as of 04/10/2024  Medication   0.9 %  sodium chloride  infusion    Allergies as of 04/10/2024   (No Known Allergies)    Past Medical History:  Diagnosis Date   Allergy    Anxiety    Arthritis    Depression    History of bronchitis    Hyperlipidemia    Hypertension    Hypoxemia 07/30/2023   O2 sats dropped in mid 60s with pt putting socks on in admitting.   Obesity    Osteoporosis    Stress incontinence     Past Surgical History:  Procedure Laterality Date   APPENDECTOMY     Arthroscopic knee surgery     Both knees- Dr. Murrell Arrant   BIOPSY  04/28/2023   Procedure: BIOPSY;  Surgeon: Lajuan Pila, MD;  Location: [facility];  Service: Gastroenterology;;   CHOLECYSTECTOMY     COLONOSCOPY  12/22/2007   Diverticulosis. [facility]. Repeat in 10 years.   ESOPHAGOGASTRODUODENOSCOPY N/A 04/28/2023   Procedure: ESOPHAGOGASTRODUODENOSCOPY (EGD);  Surgeon: Lajuan Pila, MD;  Location: [facility];  Service: Gastroenterology;  Laterality: N/A;   INJECTION KNEE Left 03/16/2016   Procedure: LEFT KNEE CORTISONE INJECTION. ;  Surgeon: Neil Balls, MD;  Location: [facility];  Service: Orthopedics;  Laterality: Left;   IR ANGIOGRAM SELECTIVE EACH ADDITIONAL VESSEL  04/23/2023   IR ANGIOGRAM SELECTIVE EACH ADDITIONAL VESSEL  04/23/2023   IR ANGIOGRAM VISCERAL SELECTIVE  04/23/2023   IR EMBO TUMOR ORGAN ISCHEMIA INFARCT INC GUIDE ROADMAPPING  04/23/2023   IR US  GUIDE VASC ACCESS RIGHT  04/23/2023   KNEE SURGERY Bilateral    arthroscopic   LAPAROTOMY     lysis of  scar adhesion   TONSILLECTOMY     TOTAL KNEE ARTHROPLASTY Right 03/16/2016   Procedure: RIGHT TOTAL KNEE ARTHROPLASTY ( LATERAL APPROACH) & LEFT KNEE CORTISONE INJECTION. ;  Surgeon: Neil Balls, MD;  Location: [facility];  Service: Orthopedics;  Laterality: Right;   TOTAL KNEE ARTHROPLASTY Left 06/24/2018   Procedure: LEFT TOTAL KNEE ARTHROPLASTY;  Surgeon: Neil Balls, MD;  Location: [facility];  Service: Orthopedics;  Laterality: Left;   TUBAL LIGATION      Family History  Problem Relation Age of Onset   Cancer Father        stomach cancer with liver mets   Heart disease Father        angina; no AMI/CAD;no CABG   Stomach cancer Father    Heart disease Brother 50       mild heart attack/AMI in 1997   Colon cancer Neg Hx    Colon polyps Neg Hx    Esophageal cancer Neg Hx    Rectal cancer Neg Hx     Social History   Socioeconomic History   Marital status: Married    Spouse name: Dee Farber   Number of children: 1   Years of education: Not on file   Highest education level: Not on file  Occupational History   Occupation: retired    Comment: cleaned house  Tobacco Use   Smoking status: Former    Current packs/day: 0.00    Average packs/day: 0.3 packs/day for 40.0 years (10.0 ttl pk-yrs)    Types: Cigarettes    Start date: 12/12/1976    Quit date: 12/12/2016    Years since quitting: 7.3   Smokeless tobacco: Never   Tobacco comments:    quit smoking 2018  Vaping Use   Vaping status: Never Used  Substance and Sexual Activity   Alcohol use: No   Drug use: Never   Sexual activity: Yes    Birth control/protection: Post-menopausal  Other Topics Concern   Not on file  Social History Narrative   Marital status: married x 33 years; second husband ;happily; no abuse.      Children: one child/son in [place] (46) in [place]; one grandchild (12yo)      Lives: with husband.      Employment:  Homemaker/retired.  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      Tobacco: quit in 11/2016;  5  cigarettes per day.  Smoking since age 1.       Alcohol:  None      Drugs: none      Exercise: no exercise due to R oasteoarthritis knee.       Seatbelt:  100%.       Guns:  Loaded; secured.      ADLs: indepdendent with ADLs.     Advanced Directives: none; desires FULL CODE but no prolonged measures.  Social Drivers of Corporate investment banker Strain: Not on file  Food Insecurity: Not on file  Transportation Needs: Not on file  Physical Activity: Not on file  Stress: Not on file  Social Connections: Not on file  Intimate Partner Violence: Not on file    Review of Systems  Respiratory:  Positive for shortness of breath.     Vitals:   04/10/24 1421  BP: 134/73  Pulse: 90  SpO2: 91%     Physical Exam Constitutional:      Appearance: She is obese.  HENT:     Head: Normocephalic.     Mouth/Throat:     Mouth: Mucous membranes are moist.  Eyes:     General: No scleral icterus. Cardiovascular:     Rate and Rhythm: Normal rate and regular rhythm.     Heart sounds: No murmur heard.    No friction rub.  Pulmonary:     Effort: No respiratory distress.     Breath sounds: No stridor. No wheezing or rhonchi.  Musculoskeletal:     Cervical back: No rigidity or tenderness.  Neurological:     Mental Status: She is alert.  Psychiatric:        Mood and Affect: Mood normal.    Data Reviewed: CT scan September 2024-evidence of emphysema-reviewed with the patient  Echocardiogram 07/30/2022-mild pulmonary hypertension, diastolic dysfunction  Most recent echocardiogram 02/16/2024 with normal ejection fraction, no mention of diastolic dysfunction  Last chest x-ray 07/30/2023-reviewed by myself with no evidence of acute disease  Pulmonary function test reviewed with the patient showing moderate obstruction with no significant bronchodilator response Significant air trapping with severe reduction in diffusing capacity  Inhaler technique was assessed in the office today and  encouraged about how to use it properly   Assessment:   Longstanding shortness of breath - Likely multifactorial Emphysema on CT - Encouraged to continue  Obstructive lung disease Emphysema Past history of smoking - Continue Stiolto   Diastolic dysfunction - Echo is stable  Some deconditioning likely playing a role -Importance of graded activities, regular exercises discussed  Plan/Recommendations: Continue current inhaler Stiolto  Graded activities as tolerated  Encouraged to make sure she stays active  Tentative follow-up in about 3 months  Encouraged to call with significant concerns  Myer Artis MD [facility] 04/10/2024, 2:27 PM  CC: Allene Ivan, MD

## 2024-04-10 NOTE — Patient Instructions (Signed)
 I will see you back in about 3 months  Continue using the inhaler  It is important to continue exercising on a regular basis  Your echocardiogram/ultrasound of the heart looks good  The breathing study shows that you do drop some air in the lungs, using the inhaler should continue to help  It is important to continue to stay active and exercise on a regular basis

## 2024-05-05 ENCOUNTER — Telehealth: Payer: Self-pay

## 2024-05-05 NOTE — Telephone Encounter (Signed)
 Copied from CRM [id number]. Topic: Clinical - Lab/Test Results >> May 04, 2024 11:38 AM Ambrose Junk wrote: Reason for CRM: [facility] calling, would like to request PFT results.   Office:  [phone].  Please Fax to:  [phone]

## 2024-05-05 NOTE — Telephone Encounter (Signed)
 Sent a copy of PFT to PCP

## 2024-05-06 ENCOUNTER — Other Ambulatory Visit: Payer: Self-pay | Admitting: Internal Medicine

## 2024-05-24 ENCOUNTER — Other Ambulatory Visit: Payer: Self-pay | Admitting: Pulmonary Disease

## 2024-07-11 ENCOUNTER — Ambulatory Visit: Admitting: Pulmonary Disease

## 2024-09-07 ENCOUNTER — Other Ambulatory Visit: Payer: Self-pay | Admitting: Gastroenterology

## 2024-09-08 ENCOUNTER — Other Ambulatory Visit: Payer: Self-pay | Admitting: Gastroenterology

## 2024-09-14 ENCOUNTER — Encounter: Payer: Self-pay | Admitting: Pulmonary Disease

## 2024-09-14 ENCOUNTER — Ambulatory Visit: Admitting: Pulmonary Disease

## 2024-09-14 VITALS — BP 118/65 | HR 105 | Temp 97.4°F | Ht 61.0 in | Wt 220.0 lb

## 2024-09-14 DIAGNOSIS — J432 Centrilobular emphysema: Secondary | ICD-10-CM

## 2024-09-14 DIAGNOSIS — R0602 Shortness of breath: Secondary | ICD-10-CM

## 2024-09-14 NOTE — Patient Instructions (Addendum)
 Continue using your Stiolto  Make sure you are staying active  Check your oxygen here in the to make sure it is staying above 90  I will see you back in about 4 months  Your CT scan looks fine  Your breathing study shows moderate obstructive disease  Call us  with significant concerns

## 2024-09-14 NOTE — Progress Notes (Signed)
 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   alendronate  (FOSAMAX ) 70 MG tablet Take 1 tablet (70 mg total) by mouth every 7 (seven) days. Take with a full glass of water  on an empty stomach.   ALPRAZolam  (XANAX ) 0.25 MG tablet Take 1 tablet (0.25 mg total) by mouth at bedtime as needed for anxiety.   amLODipine  (NORVASC ) 10 MG tablet Take 10 mg by mouth daily.   benazepril  (LOTENSIN ) 20 MG tablet Take 20 mg by mouth 2 (two) times daily.   Cholecalciferol (VITAMIN D3) 2000 units capsule Take 2,000 Units by mouth daily. (Patient taking differently: Take 2,000 Units by mouth daily. 4,000 UNITS daily)   levothyroxine  (SYNTHROID ) 50 MCG tablet Take 75 mcg by mouth every morning.   montelukast  (SINGULAIR ) 10 MG tablet Take 10 mg by mouth at bedtime.   Multiple Vitamin (MULTIVITAMIN) tablet Take 1 tablet by mouth daily.   pantoprazole  (PROTONIX ) 40  MG tablet Take 1 tablet (40 mg total) by mouth 2 (two) times daily before a meal. Patient needs follow up appointment for future refills. Please call [phone] to schedule an appointment.   simvastatin (ZOCOR) 80 MG tablet Take 80 mg by mouth daily.   STIOLTO RESPIMAT  2.5-2.5 MCG/ACT AERS INHALE 2 PUFFS INTO THE LUNGS DAILY   Facility-Administered Encounter Medications as of 09/14/2024  Medication   0.9 %  sodium chloride  infusion    Allergies as of 09/14/2024   (No Known Allergies)    Past Medical History:  Diagnosis Date   Allergy    Anxiety    Arthritis    Depression    History of bronchitis    Hyperlipidemia    Hypertension    Hypoxemia 07/30/2023   O2 sats dropped in mid 60s with pt putting socks on in admitting.   Obesity    Osteoporosis    Stress incontinence     Past Surgical History:  Procedure Laterality Date   APPENDECTOMY     Arthroscopic knee surgery     Both knees- Dr. Yvone   BIOPSY  04/28/2023   Procedure: BIOPSY;  Surgeon: Charlanne Groom, MD;  Location: [facility];  Service: Gastroenterology;;   CHOLECYSTECTOMY     COLONOSCOPY  12/22/2007   Diverticulosis. [facility]. Repeat in 10 years.   ESOPHAGOGASTRODUODENOSCOPY N/A 04/28/2023   Procedure: ESOPHAGOGASTRODUODENOSCOPY (EGD);  Surgeon: Charlanne Groom, MD;  Location: [facility];  Service: Gastroenterology;  Laterality: N/A;   INJECTION KNEE Left 03/16/2016   Procedure: LEFT KNEE CORTISONE INJECTION. ;  Surgeon: Norleen Gavel, MD;  Location: [facility];  Service: Orthopedics;  Laterality: Left;   IR ANGIOGRAM SELECTIVE EACH ADDITIONAL VESSEL  04/23/2023   IR ANGIOGRAM SELECTIVE EACH ADDITIONAL VESSEL  04/23/2023   IR ANGIOGRAM VISCERAL SELECTIVE  04/23/2023   IR EMBO TUMOR ORGAN ISCHEMIA INFARCT INC GUIDE ROADMAPPING  04/23/2023   IR US  GUIDE VASC ACCESS RIGHT  04/23/2023   KNEE SURGERY Bilateral    arthroscopic   LAPAROTOMY     lysis of scar adhesion   TONSILLECTOMY     TOTAL KNEE ARTHROPLASTY  Right 03/16/2016   Procedure: RIGHT TOTAL KNEE ARTHROPLASTY ( LATERAL APPROACH) & LEFT KNEE CORTISONE INJECTION. ;  Surgeon: Norleen Gavel, MD;  Location: [facility];  Service: Orthopedics;  Laterality: Right;   TOTAL KNEE ARTHROPLASTY Left 06/24/2018   Procedure: LEFT TOTAL KNEE ARTHROPLASTY;  Surgeon: Gavel Norleen, MD;  Location: [facility];  Service: Orthopedics;  Laterality: Left;   TUBAL LIGATION      Family History  Problem Relation Age of Onset   Cancer Father        stomach cancer with liver mets   Heart disease Father        angina; no AMI/CAD;no CABG   Stomach cancer Father    Heart disease Brother 50       mild heart attack/AMI in 1997   Colon cancer Neg Hx    Colon polyps Neg Hx    Esophageal cancer Neg Hx    Rectal cancer Neg Hx     Social History   Socioeconomic History   Marital status: Married    Spouse name: Chyrl   Number of children: 1   Years of education: Not on file   Highest education level: Not on file  Occupational History   Occupation: retired    Comment: cleaned house  Tobacco Use   Smoking status: Former    Current packs/day: 0.00    Average packs/day: 0.3 packs/day for 40.0 years (10.0 ttl pk-yrs)    Types: Cigarettes    Start date: 12/12/1976    Quit date: 12/12/2016    Years since quitting: 7.7   Smokeless tobacco: Never   Tobacco comments:    quit smoking 2018  Vaping Use   Vaping status: Never Used  Substance and Sexual Activity   Alcohol use: No   Drug use: Never   Sexual activity: Yes    Birth control/protection: Post-menopausal  Other Topics Concern   Not on file  Social History Narrative   Marital status: married x 33 years; second husband ;happily; no abuse.      Children: one child/son in [place] (46) in [place]; one grandchild (12yo)      Lives: with husband.      Employment:  Homemaker/retired.  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      Tobacco: quit in 11/2016;  5 cigarettes per day.  Smoking since age 89.       Alcohol:  None       Drugs: none      Exercise: no exercise due to R oasteoarthritis knee.       Seatbelt:  100%.       Guns:  Loaded; secured.      ADLs: indepdendent with ADLs.     Advanced Directives: none; desires FULL CODE but no prolonged measures.   Social Drivers of Corporate investment banker Strain:  Not on file  Food Insecurity: Not on file  Transportation Needs: Not on file  Physical Activity: Not on file  Stress: Not on file  Social Connections: Not on file  Intimate Partner Violence: Not on file    Review of Systems  Respiratory:  Positive for shortness of breath.     Vitals:   09/14/24 1539  BP: 118/65  Pulse: (!) 105  Temp: (!) 97.4 F (36.3 C)  SpO2: 91%     Physical Exam Constitutional:      Appearance: She is obese.  HENT:     Head: Normocephalic.     Mouth/Throat:     Mouth: Mucous membranes are moist.  Eyes:     General: No scleral icterus. Cardiovascular:     Rate and Rhythm: Normal rate and regular rhythm.     Heart sounds: No murmur heard.    No friction rub.  Pulmonary:     Effort: No respiratory distress.     Breath sounds: No stridor. No wheezing or rhonchi.     Comments: Decreased air movement bilaterally Musculoskeletal:     Cervical back: No rigidity or tenderness.  Neurological:     Mental Status: She is alert.  Psychiatric:        Mood and Affect: Mood normal.    Data Reviewed: CT scan September 2024-evidence of emphysema-reviewed with the patient  Echocardiogram 07/30/2022-mild pulmonary hypertension, diastolic dysfunction  Most recent echocardiogram 02/16/2024 with normal ejection fraction, no mention of diastolic dysfunction  Last chest x-ray 07/30/2023-reviewed by myself with no evidence of acute disease  Pulmonary function test 04/10/2024 reviewed with the patient showing moderate obstruction with no significant bronchodilator response Significant air trapping with severe reduction in diffusing capacity   Assessment:   Multifactorial  shortness of breath Emphysema Moderate obstructive disease on CT - On Stiolto - Encourage graded exercises  Diastolic dysfunction - Most recent echocardiogram has been stable  Deconditioning  Arthralgia  Class III obesity  Plan/Recommendations: Continue Stiolto - Has been on Trelegy in the past 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

## 2024-11-06 ENCOUNTER — Telehealth: Payer: Self-pay | Admitting: *Deleted

## 2024-11-06 MED ORDER — STIOLTO RESPIMAT 2.5-2.5 MCG/ACT IN AERS: 2.0000 | INHALATION_SPRAY | Freq: Every day | RESPIRATORY_TRACT | 11 refills | Status: AC

## 2024-11-06 NOTE — Telephone Encounter (Signed)
 Copied from CRM #[id number]. Topic: Clinical - Prescription Issue >> Nov 06, 2024  9:38 AM Devaughn RAMAN wrote: Reason for CRM: Pt called regarding STIOLTO RESPIMAT  2.5-2.5 MCG/ACT AERS medication. Pt stated her pharmacy advised her Dr. Neda denied her medication and she would like to f/u and inquire as to why.   Called pt back and there was no answer- LMTCB   I went ahead and refilled her Stiolto as it's clear she needed to continue this per LOV.    Will go ahead and send her a mychart msg letting her know. Nothing further needed.

## 2024-11-08 ENCOUNTER — Other Ambulatory Visit: Payer: Self-pay

## 2024-11-08 NOTE — Telephone Encounter (Signed)
 Fax from pharmacy [facility] [address] request for Stiolto 2.5 mcg.  This was ok'd  on 11/06/2024.  Last OV 09/14/2024 Dr. Neda.

## 2025-01-08 ENCOUNTER — Ambulatory Visit: Admitting: Pulmonary Disease
